# Patient Record
Sex: Male | Born: 1960 | Race: White | Hispanic: No | Marital: Married | Smoking: Former smoker
Health system: Southern US, Community
[De-identification: ages and names within clinical notes are randomized; demographics above are authoritative.]

## PROBLEM LIST (undated history)

## (undated) DIAGNOSIS — Z720 Tobacco use: Secondary | ICD-10-CM

## (undated) DIAGNOSIS — I471 Supraventricular tachycardia, unspecified: Secondary | ICD-10-CM

## (undated) DIAGNOSIS — E785 Hyperlipidemia, unspecified: Secondary | ICD-10-CM

## (undated) DIAGNOSIS — I2510 Atherosclerotic heart disease of native coronary artery without angina pectoris: Secondary | ICD-10-CM

## (undated) DIAGNOSIS — I4891 Unspecified atrial fibrillation: Secondary | ICD-10-CM

## (undated) DIAGNOSIS — I213 ST elevation (STEMI) myocardial infarction of unspecified site: Secondary | ICD-10-CM

## (undated) DIAGNOSIS — Z955 Presence of coronary angioplasty implant and graft: Secondary | ICD-10-CM

## (undated) DIAGNOSIS — I10 Essential (primary) hypertension: Secondary | ICD-10-CM

## (undated) HISTORY — DX: Presence of coronary angioplasty implant and graft: Z95.5

## (undated) HISTORY — DX: Essential (primary) hypertension: I10

## (undated) HISTORY — DX: ST elevation (STEMI) myocardial infarction of unspecified site: I21.3

## (undated) HISTORY — DX: Tobacco use: Z72.0

## (undated) HISTORY — DX: Supraventricular tachycardia: I47.1

## (undated) HISTORY — DX: Supraventricular tachycardia, unspecified: I47.10

## (undated) HISTORY — DX: Hyperlipidemia, unspecified: E78.5

## (undated) HISTORY — DX: Atherosclerotic heart disease of native coronary artery without angina pectoris: I25.10

## (undated) HISTORY — DX: Unspecified atrial fibrillation: I48.91

---

## 2004-10-04 ENCOUNTER — Observation Stay (HOSPITAL_COMMUNITY): Admission: EM | Admit: 2004-10-04 | Discharge: 2004-10-04 | Payer: Self-pay | Admitting: Emergency Medicine

## 2004-11-12 ENCOUNTER — Ambulatory Visit: Payer: Self-pay | Admitting: Internal Medicine

## 2005-05-31 ENCOUNTER — Ambulatory Visit: Payer: Self-pay | Admitting: Internal Medicine

## 2006-06-09 ENCOUNTER — Ambulatory Visit: Payer: Self-pay | Admitting: Internal Medicine

## 2006-12-16 HISTORY — PX: OTHER SURGICAL HISTORY: SHX169

## 2007-01-08 ENCOUNTER — Ambulatory Visit: Payer: Self-pay | Admitting: Internal Medicine

## 2007-01-08 LAB — CONVERTED CEMR LAB
BUN: 18 mg/dL (ref 6–23)
CO2: 27 meq/L (ref 19–32)
Calcium: 9.3 mg/dL (ref 8.4–10.5)
Chloride: 104 meq/L (ref 96–112)
Creatinine, Ser: 0.8 mg/dL (ref 0.4–1.5)
GFR calc Af Amer: 134 mL/min
GFR calc non Af Amer: 111 mL/min
Glucose, Bld: 111 mg/dL — ABNORMAL HIGH (ref 70–99)
HCT: 49.7 % (ref 39.0–52.0)
Hemoglobin: 17.3 g/dL — ABNORMAL HIGH (ref 13.0–17.0)
INR: 0.9 (ref 0.9–2.0)
MCHC: 34.8 g/dL (ref 30.0–36.0)
MCV: 89 fL (ref 78.0–100.0)
Platelets: 266 10*3/uL (ref 150–400)
Potassium: 3.6 meq/L (ref 3.5–5.1)
Prothrombin Time: 11.4 s (ref 10.0–14.0)
RBC: 5.59 M/uL (ref 4.22–5.81)
RDW: 12.5 % (ref 11.5–14.6)
Sodium: 139 meq/L (ref 135–145)
WBC: 11.1 10*3/uL — ABNORMAL HIGH (ref 4.5–10.5)
aPTT: 34.5 s (ref 26.5–36.5)

## 2007-01-13 ENCOUNTER — Ambulatory Visit (HOSPITAL_COMMUNITY): Admission: RE | Admit: 2007-01-13 | Discharge: 2007-01-14 | Payer: Self-pay | Admitting: Internal Medicine

## 2007-01-13 ENCOUNTER — Ambulatory Visit: Payer: Self-pay | Admitting: Internal Medicine

## 2007-02-11 ENCOUNTER — Ambulatory Visit: Payer: Self-pay | Admitting: Internal Medicine

## 2011-01-09 ENCOUNTER — Encounter
Admission: RE | Admit: 2011-01-09 | Discharge: 2011-01-09 | Payer: Self-pay | Source: Home / Self Care | Attending: Internal Medicine | Admitting: Internal Medicine

## 2011-04-23 ENCOUNTER — Inpatient Hospital Stay (HOSPITAL_COMMUNITY)
Admission: EM | Admit: 2011-04-23 | Discharge: 2011-04-25 | DRG: 853 | Disposition: A | Discharge status: Home / Self Care | Payer: Federal, State, Local not specified - PPO | Source: Ambulatory Visit | Attending: Cardiovascular Disease | Admitting: Cardiovascular Disease

## 2011-04-23 ENCOUNTER — Emergency Department (HOSPITAL_COMMUNITY): Payer: Federal, State, Local not specified - PPO

## 2011-04-23 DIAGNOSIS — I2510 Atherosclerotic heart disease of native coronary artery without angina pectoris: Secondary | ICD-10-CM

## 2011-04-23 DIAGNOSIS — F17200 Nicotine dependence, unspecified, uncomplicated: Secondary | ICD-10-CM | POA: Diagnosis present

## 2011-04-23 DIAGNOSIS — I959 Hypotension, unspecified: Secondary | ICD-10-CM | POA: Diagnosis present

## 2011-04-23 DIAGNOSIS — I10 Essential (primary) hypertension: Secondary | ICD-10-CM | POA: Diagnosis present

## 2011-04-23 DIAGNOSIS — Z955 Presence of coronary angioplasty implant and graft: Secondary | ICD-10-CM | POA: Insufficient documentation

## 2011-04-23 DIAGNOSIS — I2119 ST elevation (STEMI) myocardial infarction involving other coronary artery of inferior wall: Principal | ICD-10-CM | POA: Diagnosis present

## 2011-04-23 DIAGNOSIS — E785 Hyperlipidemia, unspecified: Secondary | ICD-10-CM | POA: Diagnosis present

## 2011-04-23 DIAGNOSIS — R079 Chest pain, unspecified: Secondary | ICD-10-CM

## 2011-04-23 DIAGNOSIS — I213 ST elevation (STEMI) myocardial infarction of unspecified site: Secondary | ICD-10-CM

## 2011-04-23 HISTORY — PX: CORONARY STENT PLACEMENT: SHX1402

## 2011-04-23 HISTORY — DX: ST elevation (STEMI) myocardial infarction of unspecified site: I21.3

## 2011-04-23 HISTORY — DX: Presence of coronary angioplasty implant and graft: Z95.5

## 2011-04-23 LAB — HEMOGLOBIN A1C
Hgb A1c MFr Bld: 6 % — ABNORMAL HIGH (ref <5.7)
Mean Plasma Glucose: 126 mg/dL — ABNORMAL HIGH (ref <117)

## 2011-04-23 LAB — PROTIME-INR
INR: 0.94 (ref 0.00–1.49)
Prothrombin Time: 12.8 seconds (ref 11.6–15.2)

## 2011-04-23 LAB — CARDIAC PANEL(CRET KIN+CKTOT+MB+TROPI)
CK, MB: 203.1 ng/mL (ref 0.3–4.0)
Relative Index: 8.9 — ABNORMAL HIGH (ref 0.0–2.5)
Total CK: 2274 U/L — ABNORMAL HIGH (ref 7–232)
Troponin I: >25 ng/mL (ref <0.30)

## 2011-04-23 LAB — APTT: aPTT: 29 seconds (ref 24–37)

## 2011-04-23 LAB — COMPREHENSIVE METABOLIC PANEL
ALT: 52 U/L (ref 0–53)
AST: 163 U/L — ABNORMAL HIGH (ref 0–37)
Albumin: 3.4 g/dL — ABNORMAL LOW (ref 3.5–5.2)
Alkaline Phosphatase: 82 U/L (ref 39–117)
BUN: 13 mg/dL (ref 6–23)
CO2: 26 mEq/L (ref 19–32)
Calcium: 8.9 mg/dL (ref 8.4–10.5)
Chloride: 105 mEq/L (ref 96–112)
Creatinine, Ser: 0.52 mg/dL (ref 0.4–1.5)
GFR calc Af Amer: >60 mL/min (ref >60)
GFR calc non Af Amer: >60 mL/min (ref >60)
Glucose, Bld: 104 mg/dL — ABNORMAL HIGH (ref 70–99)
Potassium: 4 mEq/L (ref 3.5–5.1)
Sodium: 138 mEq/L (ref 135–145)
Total Bilirubin: 0.4 mg/dL (ref 0.3–1.2)
Total Protein: 6.2 g/dL (ref 6.0–8.3)

## 2011-04-23 LAB — TSH: TSH: 1.518 u[IU]/mL (ref 0.350–4.500)

## 2011-04-23 LAB — POCT ACTIVATED CLOTTING TIME: Activated Clotting Time: 458 seconds

## 2011-04-23 LAB — MRSA PCR SCREENING: MRSA by PCR: NEGATIVE

## 2011-04-24 DIAGNOSIS — I214 Non-ST elevation (NSTEMI) myocardial infarction: Secondary | ICD-10-CM

## 2011-04-24 LAB — POCT I-STAT, CHEM 8
BUN: 15 mg/dL (ref 6–23)
Calcium, Ion: 1.1 mmol/L — ABNORMAL LOW (ref 1.12–1.32)
Chloride: 86 mEq/L — ABNORMAL LOW (ref 96–112)
Creatinine, Ser: 0.4 mg/dL (ref 0.4–1.5)
Glucose, Bld: 124 mg/dL — ABNORMAL HIGH (ref 70–99)
HCT: 44 % (ref 39.0–52.0)
Hemoglobin: 15 g/dL (ref 13.0–17.0)
Potassium: 3.6 mEq/L (ref 3.5–5.1)
Sodium: 120 mEq/L — ABNORMAL LOW (ref 135–145)
TCO2: 18 mmol/L (ref 0–100)

## 2011-04-24 LAB — CBC
HCT: 45.3 % (ref 39.0–52.0)
Hemoglobin: 15.3 g/dL (ref 13.0–17.0)
MCH: 30.3 pg (ref 26.0–34.0)
MCHC: 33.8 g/dL (ref 30.0–36.0)
MCV: 89.7 fL (ref 78.0–100.0)
Platelets: 246 10*3/uL (ref 150–400)
RBC: 5.05 MIL/uL (ref 4.22–5.81)
RDW: 13.9 % (ref 11.5–15.5)
WBC: 10.9 10*3/uL — ABNORMAL HIGH (ref 4.0–10.5)

## 2011-04-24 LAB — LIPID PANEL
Cholesterol: 140 mg/dL (ref 0–200)
HDL: 30 mg/dL — ABNORMAL LOW (ref >39)
LDL Cholesterol: 86 mg/dL (ref 0–99)
Total CHOL/HDL Ratio: 4.7 RATIO
Triglycerides: 119 mg/dL (ref <150)
VLDL: 24 mg/dL (ref 0–40)

## 2011-04-24 LAB — BASIC METABOLIC PANEL
BUN: 10 mg/dL (ref 6–23)
CO2: 27 mEq/L (ref 19–32)
Calcium: 9.2 mg/dL (ref 8.4–10.5)
Chloride: 103 mEq/L (ref 96–112)
Creatinine, Ser: 0.61 mg/dL (ref 0.4–1.5)
GFR calc Af Amer: >60 mL/min (ref >60)
GFR calc non Af Amer: >60 mL/min (ref >60)
Glucose, Bld: 110 mg/dL — ABNORMAL HIGH (ref 70–99)
Potassium: 4 mEq/L (ref 3.5–5.1)
Sodium: 138 mEq/L (ref 135–145)

## 2011-04-24 LAB — CARDIAC PANEL(CRET KIN+CKTOT+MB+TROPI)
CK, MB: 107.4 ng/mL (ref 0.3–4.0)
CK, MB: 43.4 ng/mL (ref 0.3–4.0)
Relative Index: 7.7 — ABNORMAL HIGH (ref 0.0–2.5)
Relative Index: 6.1 — ABNORMAL HIGH (ref 0.0–2.5)
Total CK: 1389 U/L — ABNORMAL HIGH (ref 7–232)
Total CK: 706 U/L — ABNORMAL HIGH (ref 7–232)
Troponin I: >25 ng/mL (ref <0.30)
Troponin I: 20.32 ng/mL (ref <0.30)

## 2011-04-24 LAB — MAGNESIUM: Magnesium: 2.1 mg/dL (ref 1.5–2.5)

## 2011-04-25 ENCOUNTER — Telehealth: Payer: Self-pay | Admitting: Cardiology

## 2011-04-25 DIAGNOSIS — I2119 ST elevation (STEMI) myocardial infarction involving other coronary artery of inferior wall: Secondary | ICD-10-CM

## 2011-04-25 NOTE — Telephone Encounter (Signed)
Patient's wife is calling and wants to know when and if she can come in and pick up some samples of Effient.  She said that her husband is being d/c from Hos.today. Patient has never been seen here. [place] saw him in the hos--cath.

## 2011-04-25 NOTE — Telephone Encounter (Signed)
Wife called. He is being d/c from hospital and wanted to know if we had samples. Advised that we did and may come pick up.

## 2011-04-29 NOTE — H&P (Addendum)
Mark [name], Mark [name]               ACCOUNT NO.:  [id number]  MEDICAL RECORD NO.:  [id number]           PATIENT TYPE:  E  LOCATION:  [facility]                         FACILITY:  [facility]  PHYSICIAN:  Mark [name], M.D.  DATE OF BIRTH:  1961-09-09  DATE OF ADMISSION:  04/23/2011 DATE OF DISCHARGE:                             HISTORY & PHYSICAL   PRIMARY CARDIOLOGIST:  Mark Canning. Ladona Ridgel, MD  CHIEF COMPLAINT:  Chest pain.  HISTORY OF PRESENT ILLNESS:  Mark [name] is a 50 year old gentleman with a history of SVT ablation in 2008, hyperlipidemia, hypertension, and tobacco abuse who developed chest pain at approximately 5:30 this morning, and his wife drove him to [facility] where code STEMI was activated and identified.  He had subtle ST elevation in V2, V3, aVF with reciprocal ST depression in aVL, V2.  He received aspirin, morphine, heparin, and Zofran in the ER and was taken urgently to the cath lab.  His pain is currently reduced, although he is anxious about the situation.  PAST MEDICAL HISTORY: 1. SVT, status post ablation in 2008. 2. Hyperlipidemia. 3. Hypertension.  MEDICATIONS: 1. Bystolic 5 mg daily. 2. Lipitor 40 mg daily.  ALLERGIES:  PENICILLIN.  SOCIAL HISTORY:  Mark [name] is married, has two children.  His daughter works a Buyer, retail.  He has ongoing history of tobacco abuse, having smoked for 25 years.  He denies any alcohol use.  FAMILY HISTORY:  Positive for coronary artery disease in several of his uncles.  REVIEW OF SYSTEMS:  Positive for chest pain.  Negative for shortness of breath.  All other systems reviewed and are negative except for those noted in HPI.  LABORATORY DATA:  Pending.  RADIOLOGY:  None yet.  EKG:  ST elevation in V2, V3, aVF with ST depression in aVL and V2.  PHYSICAL EXAMINATION:  VITAL SIGNS:  Temperature 97.5, pulse 79, respirations 18, blood pressure 138/87, pulse ox 90% on 2 L. GENERAL:  This is an anxious but  well-appearing white male in no acute distress. HEENT:  Normocephalic, atraumatic with extraocular movements intact and clear sclera.  Nares without discharge. NECK:  Supple. HEART:  Auscultation of the heart reveals regular rate and rhythm with S1 and S2 without murmurs, rubs, or gallops. LUNGS:  Clear to auscultation bilaterally without wheezes, rales, or rhonchi. ABDOMEN:  Soft, nontender, nondistended.  Positive bowel sounds. EXTREMITIES:  Warm and dry without edema. NEUROLOGIC:  He is alert and oriented x3, and responds to questions appropriately.  He is tearful, but normal affect given the situation.  ASSESSMENT/PLAN:  The patient was seen and examined initially by Dr. Riley Kill and myself, then fairly examined and assessed by Dr. Peter [name] who was performing the emergent cardiac catheterization on this gentleman.  He is a 50 year old man with a history of SVT ablation, hypertension, hyperlipidemia, and ongoing tobacco abuse who presents to [facility] with chest pain for approximately the last hour, identified as an acute inferior STEMI.  He will be undergoing emergent cardiac catheterization, plus or minus PCI or other interventions based on the findings by Dr. [place].  Consent was obtained  and Dr. Riley Kill has also updated family in this situation.     Mark [name], P.A.C.   ______________________________ Mark [name], M.D.    DD/MEDQ  D:  04/23/2011  T:  04/23/2011  Job:  [id number]  cc:   Mark Canning. Ladona Ridgel, MD  Electronically Signed by Mark [name] M.D. on 04/29/2011 09:40:44 AM Electronically Signed by Mark [name]  on 05/06/2011 01:50:40 PM

## 2011-04-29 NOTE — Cardiovascular Report (Signed)
Mark [name], VOORHIES               ACCOUNT NO.:  [id number]  MEDICAL RECORD NO.:  [id number]           PATIENT TYPE:  I  LOCATION:  2807                         FACILITY:  [facility]  PHYSICIAN:  Peter M. [place], M.D.  DATE OF BIRTH:  1961-01-20  DATE OF PROCEDURE:  04/23/2011 DATE OF DISCHARGE:                           CARDIAC CATHETERIZATION   INDICATIONS FOR PROCEDURE:  The patient is a 50 year old white male with history of tobacco abuse, hypertension, and hyperlipidemia who presents this morning with an acute inferior lateral myocardial infarction.  The patient's onset of pain was 5:30 a.m. and ECG showed ST-elevation of 3 mm in leads II, III and aVF as well as V6.  There was ST depression in leads aVL and V1 and V2.  This is consistent with an acute inferior lateral ST elevation myocardial infarction.  PROCEDURE:  Left heart catheterization, coronary and left ventricular angiography, intracoronary stenting of the third obtuse marginal vessel.  ACCESS:  Via the right radial artery using standard Seldinger technique.  EQUIPMENT:  6-French 3.5-cm left Judkins, 6-French 4 cm right Judkins catheter, 6-French pigtail catheter, 6-French FL 3.5 guide, a Prowater wire, a 2.75 x 20 mm VeriFLEX stent, a 3.0 x 15-mm Hanna Trek balloon.  MEDICATIONS:  Versed 1 mg IV, fentanyl 25 mcg IV, Verapamil 3 mg intraarterial.  Angiomax bolus of 0.75 mg/kg followed by continuous infusion of 1.75 mg/kg per hour.  Subsequent ACT was 451.  Effient 60 mg p.o.  CONTRAST:  110 mL of Omnipaque.  HEMODYNAMIC DATA:  Aortic pressure was 95/61 with a mean of 78 mmHg, left ventricle pressure is 91 with EDP of 15 mmHg.  ANGIOGRAPHIC DATA:  The left coronary arises and distributes normally. The left main coronary artery is normal.  Left anterior descending artery is calcified in the proximal vessel. There is diffuse 70% disease in the proximal LAD extending into the first diagonal.  There is some ectasia at  the bifurcation of the LAD and diagonal.  Left circumflex coronary artery is a large vessel which gives rise to 3 marginal vessels before terminating in the AV groove.  There is 40% disease in the proximal circumflex.  The first obtuse marginal vessel has 40% disease proximally.  The third obtuse marginal vessel was occluded proximally.  The right coronary artery arises and distributes normally and has diffuse 30-40% disease in the midvessel.  At this point, we proceeded with intervention of the third obtuse marginal vessel which was the culprit lesion.  After anticoagulation, this lesion was crossed with moderate difficulty with a wire.  We predilated the lesion with reperfusion using a 2.5 x 15-mm Apex balloon up to 4 atmospheres.  We then stented the proximal obtuse marginal vessel using a 2.75 x 20 mm VeriFLEX which was postdilated with a 3.0 x 15-mm [place] Trek balloon up to 14 atmospheres.  This yielded excellent angiographic result with 0% residual stenosis and TIMI grade 3 flow.  At this point, we performed left ventricular angiography in the RAO view.  This demonstrates normal left ventricular size with mid minimal inferior hypokinesia and normal ejection fraction of 60%.  FINAL INTERPRETATION: 1.  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 2. Successful stenting of the third obtuse marginal vessel using a     bare-metal stent. 3. Normal overall left ventricular function.  PLAN:  We will continue therapy with aspirin and Effient.  We will focus on risk factor modification with lipid lowering therapy, tobacco cessation and blood pressure control.  We will need to reassess his LAD with functional study once he is recovered from his infarct to see if he needs any additional revascularization.          ______________________________ Peter M. [place], M.D.     PMJ/MEDQ  D:  04/23/2011  T:  04/23/2011  Job:  [id number]  cc:   Doylene Canning. Ladona Ridgel,  MD  Electronically Signed by PETER [place]and M.D. on 04/29/2011 09:40:52 AM

## 2011-05-02 ENCOUNTER — Telehealth: Payer: Self-pay | Admitting: *Deleted

## 2011-05-02 NOTE — Telephone Encounter (Signed)
Spoke with pts wife who is concerned because there was confusion as to who the pt should follow up post-hosp with.  The pt at one time (2008) saw Dr Sharrell Ku for an ablation of SVT.  He has had no problems with this since.  Pt's most recent hospitalization was for an MI and stenting perform by Dr Peter Swaziland.  The pt should follow up with Dr [place] and is aware of this.  He has a post hospital appt scheduled and is aware of the date and time.  He will call back if further questions or concerns.

## 2011-05-09 ENCOUNTER — Encounter: Payer: Self-pay | Admitting: Nurse Practitioner

## 2011-05-10 ENCOUNTER — Ambulatory Visit (INDEPENDENT_AMBULATORY_CARE_PROVIDER_SITE_OTHER): Payer: Federal, State, Local not specified - PPO | Admitting: Nurse Practitioner

## 2011-05-10 ENCOUNTER — Encounter: Payer: Self-pay | Admitting: Cardiology

## 2011-05-10 ENCOUNTER — Encounter: Payer: Self-pay | Admitting: Nurse Practitioner

## 2011-05-10 VITALS — BP 120/88 | HR 76 | Ht 67.0 in | Wt 152.0 lb

## 2011-05-10 DIAGNOSIS — E785 Hyperlipidemia, unspecified: Secondary | ICD-10-CM | POA: Insufficient documentation

## 2011-05-10 DIAGNOSIS — F17200 Nicotine dependence, unspecified, uncomplicated: Secondary | ICD-10-CM

## 2011-05-10 DIAGNOSIS — Z720 Tobacco use: Secondary | ICD-10-CM

## 2011-05-10 DIAGNOSIS — I2510 Atherosclerotic heart disease of native coronary artery without angina pectoris: Secondary | ICD-10-CM

## 2011-05-10 MED ORDER — ATORVASTATIN CALCIUM 80 MG PO TABS: 40.0000 mg | ORAL_TABLET | Freq: Every day | ORAL | Status: DC

## 2011-05-10 NOTE — Patient Instructions (Signed)
Goal of walking is 45 minutes each day. Stay on Lipitor 40 mg a day. Go to [email or web address] for the 4$ program. Stay on your other medicines. We will plan for a stress test in about 3 months You may return to work on June 2nd, half days only with no heavy lifting (nothing over 25#) for 3 weeks. We will see you back in about 3 weeks.  Congratulations on not smoking!!!

## 2011-05-10 NOTE — Assessment & Plan Note (Addendum)
He is doing well. I have spoken with Dr. [place]. Ideally, we would like to keep him on his Effient for the next one year. He is encouraged to not resume smoking. We will let him return to work on June 2nd for half days, light duty with no heavy lifting of over 25# for 3 weeks. I will see him back in 3 weeks. He is encouraged to continue with his walking, goal of 45 minutes per day. We will consider a stress test in about 3 months. Patient is agreeable to this plan and will call if any problems develop in the interim.

## 2011-05-10 NOTE — Assessment & Plan Note (Signed)
He seems committed to not resume smoking.

## 2011-05-10 NOTE — Progress Notes (Signed)
    Mark [name] Date of Birth: 10-04-1961   History of Present Illness: Mark [name] is seen back today for a post hospital visit. He is seen for Dr. [place]. He had a STEMI earlier in the month. He was treated with a BMS to the 3rd OM. He does have residual LAD disease. He will need follow up stress testing in the future. Since discharge, he has done very well. He has had no more chest pain. He is not smoking. He is using some of the patches. He is walking daily. He is currently on LOA and would like to go back to work soon. He and his wife have significant financial concerns. His lipids at time of admission showed an LDL of 86. His HDL is low at 30. He is on Lipitor 40mg  daily. He had no issues with his cath site (right radial).   Current Outpatient Prescriptions on File Prior to Visit  Medication Sig Dispense Refill  . aspirin 81 MG tablet Take 81 mg by mouth daily.        . metoprolol tartrate (LOPRESSOR) 25 MG tablet Take 25 mg by mouth 2 (two) times daily.        . prasugrel (EFFIENT) 10 MG TABS Take by mouth.        . DISCONTD: atorvastatin (LIPITOR) 40 MG tablet Take 40 mg by mouth daily.        Marland Kitchen DISCONTD: nebivolol (BYSTOLIC) 5 MG tablet Take 5 mg by mouth daily.          Allergies  Allergen Reactions  . Penicillins     Past Medical History  Diagnosis Date  . Chest pain   . Hyperlipidemia   . Hypertension   . Tobacco abuse   . SVT (supraventricular tachycardia)   . STEMI (ST elevation myocardial infarction) 04/23/11  . S/P coronary artery stent placement 04/23/11    BMS to the 3rd OM; has residual disease    Past Surgical History  Procedure Date  . Svt ablation  2008    Dr. Ladona Ridgel  . Coronary stent placement 04/23/11    BMS to the 3rd OM    History  Smoking status  . Former Smoker  . Quit date: 04/23/2011  Smokeless tobacco  . Former Neurosurgeon  . Quit date: 04/23/2011    History  Alcohol Use No    Family History  Problem Relation Age of Onset  . Hypertension Mother       Review of Systems: The review of systems is as above.  All other systems were reviewed and are negative.  Physical Exam: BP 120/88  Pulse 76  Ht 5\' 7"  (1.702 m)  Wt 152 lb (68.947 kg)  BMI 23.81 kg/m2 Patient is very pleasant and in no acute distress. Skin is warm and dry. Color is normal.  HEENT is unremarkable. Normocephalic/atraumatic. PERRL. Sclera are nonicteric. Neck is supple. No masses. No JVD. Lungs are clear. Cardiac exam shows a regular rate and rhythm. Abdomen is soft. Extremities are without edema. Gait and ROM are intact. No gross neurologic deficits noted.  LABORATORY DATA: N/A   Assessment / Plan:

## 2011-05-10 NOTE — Assessment & Plan Note (Addendum)
I have left him on his Lipitor 40 mg daily. He will try to obtain under the $4 program online. May need to consider adding Niaspan in the future.

## 2011-05-31 ENCOUNTER — Encounter: Payer: Self-pay | Admitting: Nurse Practitioner

## 2011-05-31 ENCOUNTER — Ambulatory Visit (INDEPENDENT_AMBULATORY_CARE_PROVIDER_SITE_OTHER): Payer: Federal, State, Local not specified - PPO | Admitting: Nurse Practitioner

## 2011-05-31 VITALS — BP 140/90 | HR 76 | Ht 67.0 in | Wt 152.2 lb

## 2011-05-31 DIAGNOSIS — I2510 Atherosclerotic heart disease of native coronary artery without angina pectoris: Secondary | ICD-10-CM

## 2011-05-31 DIAGNOSIS — I219 Acute myocardial infarction, unspecified: Secondary | ICD-10-CM

## 2011-05-31 DIAGNOSIS — F17200 Nicotine dependence, unspecified, uncomplicated: Secondary | ICD-10-CM

## 2011-05-31 DIAGNOSIS — Z720 Tobacco use: Secondary | ICD-10-CM

## 2011-05-31 DIAGNOSIS — I213 ST elevation (STEMI) myocardial infarction of unspecified site: Secondary | ICD-10-CM

## 2011-05-31 NOTE — Progress Notes (Signed)
    [name] Date of Birth: Apr 19, 1961   History of Present Illness: Zaahir is seen today for a follow up visit. He is seen for Dr. [place]. He has had a STEMI recently. He does have known residual disease. He is doing well. He is back to work. He is a little fatigued, probably from his beta blocker. No chest pain. He is walking daily and feels good. He is not smoking. He is using some nicotine patches.   Current Outpatient Prescriptions on File Prior to Visit  Medication Sig Dispense Refill  . aspirin 81 MG tablet Take 81 mg by mouth daily.        Marland Kitchen atorvastatin (LIPITOR) 80 MG tablet Take 0.5 tablets (40 mg total) by mouth daily.  30 tablet  11  . metoprolol tartrate (LOPRESSOR) 25 MG tablet Take 25 mg by mouth 2 (two) times daily.        . prasugrel (EFFIENT) 10 MG TABS Take by mouth.          Allergies  Allergen Reactions  . Penicillins     Past Medical History  Diagnosis Date  . Chest pain   . Hyperlipidemia   . Hypertension   . Tobacco abuse   . SVT (supraventricular tachycardia)   . STEMI (ST elevation myocardial infarction) 04/23/11  . S/P coronary artery stent placement 04/23/11    BMS to the 3rd OM; has residual disease    Past Surgical History  Procedure Date  . Svt ablation  2008    Dr. Ladona Ridgel  . Coronary stent placement 04/23/11    BMS to the 3rd OM    History  Smoking status  . Former Smoker  . Quit date: 04/23/2011  Smokeless tobacco  . Former Neurosurgeon  . Quit date: 04/23/2011    History  Alcohol Use No    Family History  Problem Relation Age of Onset  . Hypertension Mother     Review of Systems: The review of systems is positive for some fatigue.  All other systems were reviewed and are negative.  Physical Exam: BP 140/90  Pulse 76  Ht 5\' 7"  (1.702 m)  Wt 152 lb 3.2 oz (69.037 kg)  BMI 23.84 kg/m2 BP 130/80 Patient is very pleasant and in no acute distress. Skin is warm and dry. Color is normal.  HEENT is unremarkable.  Normocephalic/atraumatic. PERRL. Sclera are nonicteric. Neck is supple. No masses. No JVD. Lungs are clear. Cardiac exam shows a regular rate and rhythm. Abdomen is soft. Extremities are without edema. Gait and ROM are intact. No gross neurologic deficits noted.  LABORATORY DATA: N/A   Assessment / Plan:

## 2011-05-31 NOTE — Assessment & Plan Note (Signed)
He is not smoking. He is using his patches. I encouraged smoking cessation.

## 2011-05-31 NOTE — Patient Instructions (Addendum)
Stay on your current medicines. We will go ahead and get your stress test done towards the middle to end of July Congratulations on not smoking. You will see Dr. [place]and back after your stress test to discuss.

## 2011-05-31 NOTE — Assessment & Plan Note (Signed)
He is doing well. We will arrange for a stress test towards the end of July. He will be going on vacation the first week of August. I think his fatigue is probably related to his beta blocker but he is willing to continue. Overall, he is doing well. I encouraged him to stay off of his cigarettes. We will see him back after his stress test to review those findings. He is to call for any problems in the interm. Patient is agreeable to this plan and will call if any problems develop in the interim.

## 2011-06-12 ENCOUNTER — Telehealth: Payer: Self-pay | Admitting: Cardiology

## 2011-06-12 NOTE — Telephone Encounter (Signed)
Patient wife called and wants to know if she can come by and pick up samples of Effient.

## 2011-06-12 NOTE — Telephone Encounter (Signed)
Called requesting samples of effient. Do have some available. Will pick up

## 2011-06-24 ENCOUNTER — Telehealth: Payer: Self-pay | Admitting: Cardiology

## 2011-06-24 NOTE — Telephone Encounter (Signed)
Patients daughter, Hughes Better will be coming by to pick up effient samples.

## 2011-06-24 NOTE — Telephone Encounter (Signed)
Called requesting samples of Effient. Have some available. Will pick up

## 2011-07-04 NOTE — Discharge Summary (Signed)
Mark [name], Mark [name]               ACCOUNT NO.:  [id number]  MEDICAL RECORD NO.:  [id number]           PATIENT TYPE:  I  LOCATION:  2039                         FACILITY:  [facility]  PHYSICIAN:  Mark [name], M.D.  DATE OF BIRTH:  12/31/1960  DATE OF ADMISSION:  04/23/2011 DATE OF DISCHARGE:  04/25/2011                              DISCHARGE SUMMARY   PRIMARY CARDIOLOGIST:  Mark Vallez M. [place], MD  ELECTROPHYSIOLOGIST:  Mark Canning. Ladona Ridgel, MD  DISCHARGE DIAGNOSES: 1. Acute inferolateral ST-elevation myocardial infarction.     a.     Cardiac catheterization on Apr 23, 2011:  Two-vessel      obstructive atherosclerotic coronary artery disease.  Successful      stenting of the third obtuse marginal using a 2.75- x 2.0-mm      VeriFLEX.  Normal overall left ventricular function (left anterior      descending with 70% proximal residual stenosis). 2. Ongoing tobacco abuse. 3. Hypertension (hypotensive this admission, systolic blood pressure     86-105 this admission). 4. Dyslipidemia.     a.     Total cholesterol 140, triglycerides 119, HDL 30, LDL 86,      total cholesterol/HDL ratio 4.7.  SECONDARY DIAGNOSIS:  History of supraventricular tachycardia. A.  Status post ablation, 2008 (followed by Dr. Ladona [name]).  ALLERGIES AND INTOLERANCES:  Penicillins (passed out).  PROCEDURES: 1. EKG on Apr 23, 2011:  NSR with ST elevation in V2, V3, aVF, ST     depression in aVL, and V2. 2. Cardiac catheterization on Apr 23, 2011:  LM normal.  LAD calcified     proximally, diffuse 70% disease in the proximal LAD extending into     the D1, and then ectasia at the bifurcation of LAD and diagonal.     Left circumflex coronary is a large vessel, gives off three obtuse     marginal branches, terminating in the AV groove.  A 40% disease in     the proximal circumflex, OM-1 with 40% disease proximally, OM-3     occluded proximally.  RCA arises and distributes normally and has     diffuse 30% to 40% disease in  the mid vessel. 3. PCI with 2.75- x 2.0-mm VeriFLEX to OM-3. 4. Normal overall LV function.  HISTORY OF PRESENT ILLNESS:  Mr. Mark [name] is a 50 year old Caucasian gentleman with the above-noted medical history but no prior cardiac history other than SVT ablated in 2008 until he presented to [facility] with complaints of chest discomfort, subsequently found to have inferolateral ST changes consistent with ST MI, taken emergently to the cardiac catheterization lab.  HOSPITAL COURSE:  The patient was admitted and underwent procedures as described above.  He tolerated them well without any significant complications.  Loaded with Effient and will continue on DAPT with low- dose aspirin and Effient going forward in the setting of PCI with BMS. Did receive tobacco cessation consult, given ongoing tobacco abuse.  He worked with Cardiac Rehab Phase I on Apr 24, 2011, ambulating 510 feet with no complaints and only one assist.  Seen on the morning of Apr 25, 2011, by attending  cardiologist/primary cardiologist, Dr. Angelys Yetman [name], and deemed stable for discharge with early followup at [facility], scheduled for May 10, 2011, at 9:15 a.m.  Please see discharge med section for final discharge meds plan.  At the time of his discharge, the patient received his new med list, prescriptions, followup instructions, and postcath instructions.  All questions and concerns were addressed prior to leaving the hospital.  DISCHARGE LABORATORY DATA:  WBC is 10.9, HGB 15.3, HCT 45.3, PLT count is 246.  Protime 12.8, INR 0.94.  Sodium 138, potassium 4.0, chloride 103, bicarb 27, BUN 10, creatinine 0.61, glucose 110.  Liver function tests within normal limits except for AST at 163, albumin 3.4, calcium 9.2, magnesium 2.1.  Hemoglobin A1c 6.0 with a mean plasma glucose of 126.  First full set of enzymes, CK 2274, MB 203 with a relative index of 8.9, troponin greater than 25.  Second full set, CK 1389, MB  107.4 with relative index of 7.7, and troponin greater than 25.  Third full set, CK 706, MB 43.4, relative index 6.1, troponin 20.32.  Total cholesterol 140, triglycerides 119, HDL 30, LDL 86, total cholesterol/HDL ratio 4.7.  TSH 1.518.  FOLLOWUP PLANS AND APPOINTMENTS:  Please see hospital course.  DISCHARGE MEDICATIONS: 1. Atorvastatin 80 mg 1 tablet p.o. at bedtime. 2. Enteric-coated aspirin 81 mg p.o. daily. 3. Metoprolol 25 mg p.o. b.i.d. 4. Sublingual nitroglycerin 0.4 mg 1 tablet q.5 minutes up to three     doses p.r.n. for chest discomfort.  Duration of discharge encounter including physician time was 35 minutes.     Mark [name], PAC   ______________________________ Mirka Barbone M. [name], M.D.    MS/MEDQ  D:  04/25/2011  T:  04/25/2011  Job:  [id number]  cc:   Mark Canning. Ladona Ridgel, MD  Electronically Signed by Latorsha Curling [name] M.D. on 07/04/2011 11:46:20 AM

## 2011-07-08 ENCOUNTER — Ambulatory Visit (HOSPITAL_COMMUNITY): Payer: Federal, State, Local not specified - PPO | Attending: Cardiology | Admitting: Radiology

## 2011-07-08 VITALS — Ht 67.0 in | Wt 151.0 lb

## 2011-07-08 DIAGNOSIS — I213 ST elevation (STEMI) myocardial infarction of unspecified site: Secondary | ICD-10-CM

## 2011-07-08 DIAGNOSIS — Z8249 Family history of ischemic heart disease and other diseases of the circulatory system: Secondary | ICD-10-CM | POA: Insufficient documentation

## 2011-07-08 DIAGNOSIS — Z87891 Personal history of nicotine dependence: Secondary | ICD-10-CM | POA: Insufficient documentation

## 2011-07-08 DIAGNOSIS — R5381 Other malaise: Secondary | ICD-10-CM | POA: Insufficient documentation

## 2011-07-08 DIAGNOSIS — I252 Old myocardial infarction: Secondary | ICD-10-CM | POA: Insufficient documentation

## 2011-07-08 DIAGNOSIS — R079 Chest pain, unspecified: Secondary | ICD-10-CM | POA: Insufficient documentation

## 2011-07-08 DIAGNOSIS — I10 Essential (primary) hypertension: Secondary | ICD-10-CM | POA: Insufficient documentation

## 2011-07-08 DIAGNOSIS — I2510 Atherosclerotic heart disease of native coronary artery without angina pectoris: Secondary | ICD-10-CM

## 2011-07-08 MED ORDER — TECHNETIUM TC 99M TETROFOSMIN IV KIT
11.0000 | PACK | Freq: Once | INTRAVENOUS | Status: AC | PRN
  Administered 2011-07-08: 11 via INTRAVENOUS

## 2011-07-08 MED ORDER — TECHNETIUM TC 99M TETROFOSMIN IV KIT
33.0000 | PACK | Freq: Once | INTRAVENOUS | Status: AC | PRN
  Administered 2011-07-08: 33 via INTRAVENOUS

## 2011-07-08 NOTE — Progress Notes (Signed)
[facility] SITE 3 NUCLEAR MED [address] [place] [address] [phone]  Cardiology Nuclear Med Study  Mark [name] is a 50 y.o. male [id number] November 08, 1961   Nuclear Med Background Indication for Stress Test:  Evaluation for Ischemia, Stent Patency and Assess known LAD lesion seen on cath 04/23/11 History:  '08 Ablation, 04/23/11 ILWI>Cath>Stent-OM3, EF=60%; H/O SVT Cardiac Risk Factors: Family History - CAD, History of Smoking, Hypertension and Lipids  Symptoms:  Chest Pain (last date of chest discomfort: None since stent put in) and Fatigue   Nuclear Pre-Procedure Caffeine/Decaff Intake:  None NPO After: 7:00pm   Lungs:  Clear. IV 0.9% NS with Angio Cath:  22g  IV Site: R Hand  IV Started by:  Irean Hong, RN  Chest Size (in):  38 Cup Size: n/a  Height: 5\' 7"  (1.702 m)  Weight:  151 lb (68.493 kg)  BMI:  Body mass index is 23.65 kg/(m^2). Tech Comments:  Held metoprolol x 24 hrs    Nuclear Med Study 1 or 2 day study: 1 day  Stress Test Type:  Stress  Reading MD: Dietrich Pates, MD  Order Authorizing Provider:  Peter Swaziland, MD  Resting Radionuclide: Technetium 71m Tetrofosmin  Resting Radionuclide Dose: 11 mCi   Stress Radionuclide:  Technetium 34m Tetrofosmin  Stress Radionuclide Dose: 33 mCi           Stress Protocol Rest HR: 64 Stress HR: 173  Rest BP: 129/80 Stress BP: 171/87  Exercise Time (min): 10:30 METS: 12.5   Predicted Max HR: 170 bpm % Max HR: 101.76 bpm Rate Pressure Product: 21308   Dose of Adenosine (mg):  n/a Dose of Lexiscan: n/a mg  Dose of Atropine (mg): n/a Dose of Dobutamine: n/a mcg/kg/min (at max HR)  Stress Test Technologist: Smiley Houseman, CMA-N  Nuclear Technologist:  Domenic Polite, CNMT     Rest Procedure:  Myocardial perfusion imaging was performed at rest 45 minutes following the intravenous administration of Technetium 49m Tetrofosmin.  Rest ECG: IRBBB  Stress Procedure:  The patient exercised for 10:30  on the treadmill utilizing the Bruce protocol.  The patient stopped due to fatigue and denied any chest pain.  There were no diagnostic ST-T wave changes.  Technetium 82m Tetrofosmin was injected at peak exercise and myocardial perfusion imaging was performed after a brief delay.  Stress ECG: No significant change from baseline ECG  QPS Raw Data Images: Soft tissue (diaphragm) underlies the inferior wall. Stress Images: Defect in the inferior wall (base, mid, distal), inferolateral wall (base, mid, distal).  Normal perfusion elsewhere. Rest Images:  Minimal improvement in the distal inferior, inferolateral wall.   Subtraction (SDS):  By qunatitation significant for ischemia.  Visual does not appear significant. Transient Ischemic Dilatation (Normal <1.22):  1.02 Lung/Heart Ratio (Normal <0.45):  .27  Quantitative Gated Spect Images QGS EDV:  83 ml QGS ESV:  27 ml QGS cine images: Normal wall motion, thickening. QGS EF: 67%  Impression Exercise Capacity: Excellent BP Response:  Normal blood pressure response. Clinical Symptoms:  No chest pain. ECG Impression:  No significant ST segment change suggestive of ischemia. Comparison with Prior Nuclear Study: No previous nuclear study performed  Overall Impression:  Inferior/inferolateral defect consistent with inferior/inferolateral subendocardial scar  With mild periinfarct ischemia.  Cannot exclude soft tissue attenuation.  LVEF is 67% with normal wall motion.Normal perfusion elsewhere.  Dietrich Pates

## 2011-07-09 NOTE — Progress Notes (Signed)
nuc med report routed to Dr. Jordan 07/09/11 Sadarius Norman  

## 2011-07-09 NOTE — Progress Notes (Signed)
Nuclear findings consistent with prior infarct. No ischemia in LAD territory. Prior cath showed 70% stenosis. EF is good. Need to continue medical therapy. Theron Arista Sw[place]and

## 2011-07-10 ENCOUNTER — Other Ambulatory Visit: Payer: Self-pay | Admitting: Emergency Medicine

## 2011-07-10 ENCOUNTER — Ambulatory Visit (HOSPITAL_COMMUNITY)
Admission: RE | Admit: 2011-07-10 | Discharge: 2011-07-10 | Disposition: A | Discharge status: Home / Self Care | Payer: Federal, State, Local not specified - PPO | Source: Ambulatory Visit | Attending: Emergency Medicine | Admitting: Emergency Medicine

## 2011-07-10 DIAGNOSIS — R1031 Right lower quadrant pain: Secondary | ICD-10-CM

## 2011-07-11 NOTE — Progress Notes (Signed)
lm

## 2011-07-12 ENCOUNTER — Telehealth: Payer: Self-pay | Admitting: Cardiology

## 2011-07-12 NOTE — Progress Notes (Signed)
Notified of stress test results. Scheduled for OV on 7/30

## 2011-07-12 NOTE — Telephone Encounter (Signed)
Pt's wife returning Mark [name]'s call.

## 2011-07-15 ENCOUNTER — Telehealth: Payer: Self-pay | Admitting: Cardiology

## 2011-07-15 NOTE — Telephone Encounter (Signed)
Came in, in person toady under the impression that her husbands appointmnet was today, and upon finding out that it is tomorrow, wanted to reschedule their appointment for tomorrow for a later date after the 12th of August. Please call back. Patient is EPIC only.

## 2011-07-16 ENCOUNTER — Ambulatory Visit: Payer: Federal, State, Local not specified - PPO | Admitting: Cardiology

## 2011-07-17 NOTE — Telephone Encounter (Signed)
Wife called back to reschedule Mark [name] app. They can in on Monday thinking app was then instead of Tuesday. Could not make app yest. Rescheduled for 8/31 at 2:30

## 2011-08-16 ENCOUNTER — Encounter: Payer: Self-pay | Admitting: Cardiology

## 2011-08-16 ENCOUNTER — Ambulatory Visit (INDEPENDENT_AMBULATORY_CARE_PROVIDER_SITE_OTHER): Payer: Federal, State, Local not specified - PPO | Admitting: Cardiology

## 2011-08-16 VITALS — BP 130/86 | HR 60 | Ht 67.0 in | Wt 155.4 lb

## 2011-08-16 DIAGNOSIS — F17200 Nicotine dependence, unspecified, uncomplicated: Secondary | ICD-10-CM

## 2011-08-16 DIAGNOSIS — Z720 Tobacco use: Secondary | ICD-10-CM

## 2011-08-16 DIAGNOSIS — I2510 Atherosclerotic heart disease of native coronary artery without angina pectoris: Secondary | ICD-10-CM

## 2011-08-16 DIAGNOSIS — E785 Hyperlipidemia, unspecified: Secondary | ICD-10-CM

## 2011-08-16 NOTE — Progress Notes (Signed)
    Mark [name] Date of Birth: 09-Aug-1961   History of Present Illness: Mark [name] is seen today for a follow up visit. He reports that he is feeling very well and is back to his normal activity. He is getting exercise regularly. He denies any chest pain, shortness of breath, or palpitations. He is no longer smoking.  Current Outpatient Prescriptions on File Prior to Visit  Medication Sig Dispense Refill  . aspirin 81 MG tablet Take 81 mg by mouth daily.        Marland Kitchen atorvastatin (LIPITOR) 80 MG tablet Take 0.5 tablets (40 mg total) by mouth daily.  30 tablet  11  . metoprolol tartrate (LOPRESSOR) 25 MG tablet Take 25 mg by mouth 2 (two) times daily.        . prasugrel (EFFIENT) 10 MG TABS Take by mouth.          Allergies  Allergen Reactions  . Penicillins     Past Medical History  Diagnosis Date  . CAD (coronary artery disease)   . Hyperlipidemia   . Hypertension   . Tobacco abuse   . SVT (supraventricular tachycardia)   . STEMI (ST elevation myocardial infarction) 04/23/11  . S/P coronary artery stent placement 04/23/11    BMS to the 3rd OM; has residual disease    Past Surgical History  Procedure Date  . Svt ablation  2008    Dr. Ladona Ridgel  . Coronary stent placement 04/23/11    BMS to the 3rd OM    History  Smoking status  . Former Smoker  . Quit date: 04/23/2011  Smokeless tobacco  . Former Neurosurgeon  . Quit date: 04/23/2011    History  Alcohol Use No    Family History  Problem Relation Age of Onset  . Hypertension Mother     Review of Systems: The review of systems is positive for some fatigue.  All other systems were reviewed and are negative.  Physical Exam: BP 130/86  Pulse 60  Ht 5\' 7"  (1.702 m)  Wt 155 lb 6.4 oz (70.489 kg)  BMI 24.34 kg/m2 BP 130/80 Patient is very pleasant and in no acute distress. Skin is warm and dry. Color is normal.  HEENT is unremarkable. Normocephalic/atraumatic. PERRL. Sclera are nonicteric. Neck is supple. No masses. No JVD.  Lungs are clear. Cardiac exam shows a regular rate and rhythm. Abdomen is soft. Extremities are without edema. Gait and ROM are intact. No gross neurologic deficits noted.  LABORATORY DATA: N/A Recent stress Myoview was reviewed. He was able to walk 10-1/2 minutes on the Bruce protocol without symptoms of chest pain or ECG changes. His Myoview images demonstrated a fixed inferior and inferior lateral defect without ischemia. There is no anterior wall ischemia. Ejection fraction was normal 67%.  Assessment / Plan:

## 2011-08-16 NOTE — Assessment & Plan Note (Signed)
I have congratulated him on his smoking cessation.

## 2011-08-16 NOTE — Patient Instructions (Signed)
Continue your current medications.  We will schedule you for fasting lab work.  I will see you again in 6 months.

## 2011-08-16 NOTE — Assessment & Plan Note (Signed)
We will schedule a followup lipid panel and chemistries on his current therapy.

## 2011-08-16 NOTE — Assessment & Plan Note (Signed)
He is asymptomatic. There is no evidence of ischemia in the anterior wall distribution so no further therapy at this time of his moderate disease there. Continue risk factor modification. I'll followup again in 6 months.

## 2011-08-21 ENCOUNTER — Other Ambulatory Visit (INDEPENDENT_AMBULATORY_CARE_PROVIDER_SITE_OTHER): Payer: Federal, State, Local not specified - PPO | Admitting: *Deleted

## 2011-08-21 DIAGNOSIS — I2510 Atherosclerotic heart disease of native coronary artery without angina pectoris: Secondary | ICD-10-CM

## 2011-08-21 LAB — HEPATIC FUNCTION PANEL
ALT: 33 U/L (ref 0–53)
Total Bilirubin: 0.8 mg/dL (ref 0.3–1.2)

## 2011-08-21 LAB — BASIC METABOLIC PANEL
BUN: 15 mg/dL (ref 6–23)
Chloride: 110 mEq/L (ref 96–112)
Creatinine, Ser: 0.7 mg/dL (ref 0.4–1.5)
GFR: 128.55 mL/min (ref >60.00)

## 2011-08-21 LAB — LIPID PANEL
Cholesterol: 117 mg/dL (ref 0–200)
LDL Cholesterol: 68 mg/dL (ref 0–99)
Triglycerides: 69 mg/dL (ref 0.0–149.0)
VLDL: 13.8 mg/dL (ref 0.0–40.0)

## 2011-09-26 ENCOUNTER — Other Ambulatory Visit: Payer: Self-pay | Admitting: *Deleted

## 2011-09-26 ENCOUNTER — Other Ambulatory Visit: Payer: Self-pay

## 2011-09-26 MED ORDER — METOPROLOL TARTRATE 25 MG PO TABS: 25.0000 mg | ORAL_TABLET | Freq: Two times a day (BID) | ORAL | Status: DC

## 2011-09-27 ENCOUNTER — Telehealth: Payer: Self-pay | Admitting: Cardiology

## 2011-09-27 NOTE — Telephone Encounter (Signed)
Do you have any samples of Effient they can pick up?  Please call them back.  He has about 2 wks left.  Aware that Synetta Fail is off today.  Leave message if no answer.

## 2011-09-27 NOTE — Telephone Encounter (Signed)
I spoke with pt wife. She will pick up Effient Monday. Pt has enough for the weekend.  Mylo Red RN

## 2011-10-25 ENCOUNTER — Other Ambulatory Visit: Payer: Self-pay | Admitting: Cardiology

## 2011-10-25 ENCOUNTER — Other Ambulatory Visit: Payer: Self-pay | Admitting: *Deleted

## 2011-10-25 MED ORDER — METOPROLOL TARTRATE 25 MG PO TABS: 25.0000 mg | ORAL_TABLET | Freq: Two times a day (BID) | ORAL | Status: DC

## 2011-11-26 ENCOUNTER — Telehealth: Payer: Self-pay | Admitting: Cardiology

## 2011-11-26 NOTE — Telephone Encounter (Signed)
SPOKE WITH PATIENTS WIFE ABOUT SAMPLES WILL BE PUT UP FRONT. SHE WILL PICK THEM UP TOMORROW 12/12

## 2011-11-26 NOTE — Telephone Encounter (Signed)
Pt needs samples of effient 10mg  qd

## 2012-01-08 ENCOUNTER — Ambulatory Visit (INDEPENDENT_AMBULATORY_CARE_PROVIDER_SITE_OTHER): Payer: Federal, State, Local not specified - PPO | Admitting: Cardiology

## 2012-01-08 ENCOUNTER — Encounter: Payer: Self-pay | Admitting: Cardiology

## 2012-01-08 ENCOUNTER — Other Ambulatory Visit: Payer: Self-pay

## 2012-01-08 VITALS — BP 110/64 | HR 74 | Ht 67.0 in | Wt 158.8 lb

## 2012-01-08 DIAGNOSIS — I2510 Atherosclerotic heart disease of native coronary artery without angina pectoris: Secondary | ICD-10-CM

## 2012-01-08 DIAGNOSIS — E785 Hyperlipidemia, unspecified: Secondary | ICD-10-CM

## 2012-01-08 DIAGNOSIS — Z720 Tobacco use: Secondary | ICD-10-CM

## 2012-01-08 DIAGNOSIS — I252 Old myocardial infarction: Secondary | ICD-10-CM

## 2012-01-08 DIAGNOSIS — F17200 Nicotine dependence, unspecified, uncomplicated: Secondary | ICD-10-CM

## 2012-01-08 DIAGNOSIS — E7800 Pure hypercholesterolemia, unspecified: Secondary | ICD-10-CM

## 2012-01-08 NOTE — Assessment & Plan Note (Signed)
His last lipid panel in September showed an excellent response to Lipitor. We will continue on his current therapy.

## 2012-01-08 NOTE — Progress Notes (Signed)
    Mark [name] Date of Birth: 07-29-1961   History of Present Illness: Mark [name] is seen today for a follow up visit. He reports that he is feeling very well. He is getting exercise regularly. He denies any chest pain, shortness of breath, or palpitations. He is no longer smoking.  Current Outpatient Prescriptions on File Prior to Visit  Medication Sig Dispense Refill  . aspirin 81 MG tablet Take 81 mg by mouth daily.        Marland Kitchen atorvastatin (LIPITOR) 80 MG tablet Take 0.5 tablets (40 mg total) by mouth daily.  30 tablet  11  . metoprolol tartrate (LOPRESSOR) 25 MG tablet Take 1 tablet (25 mg total) by mouth 2 (two) times daily.  60 tablet  3  . prasugrel (EFFIENT) 10 MG TABS Take by mouth.          Allergies  Allergen Reactions  . Penicillins     Past Medical History  Diagnosis Date  . CAD (coronary artery disease)   . Hyperlipidemia   . Hypertension   . Tobacco abuse   . SVT (supraventricular tachycardia)   . STEMI (ST elevation myocardial infarction) 04/23/11  . S/P coronary artery stent placement 04/23/11    BMS to the 3rd OM; has residual disease    Past Surgical History  Procedure Date  . Svt ablation  2008    Dr. Ladona Ridgel  . Coronary stent placement 04/23/11    BMS to the 3rd OM    History  Smoking status  . Former Smoker  . Quit date: 04/23/2011  Smokeless tobacco  . Former Neurosurgeon  . Quit date: 04/23/2011    History  Alcohol Use No    Family History  Problem Relation Age of Onset  . Hypertension Mother     Review of Systems: The review of systems is positive for some fatigue.  All other systems were reviewed and are negative.  Physical Exam: BP 110/64  Pulse 74  Ht 5\' 7"  (1.702 m)  Wt 158 lb 12.8 oz (72.031 kg)  BMI 24.87 kg/m2 BP 130/80 Patient is very pleasant and in no acute distress. Skin is warm and dry. Color is normal.  HEENT is unremarkable. Normocephalic/atraumatic. PERRL. Sclera are nonicteric. Neck is supple. No masses. No JVD. Lungs are  clear. Cardiac exam shows a regular rate and rhythm. Abdomen is soft. Extremities are without edema. Gait and ROM are intact. No gross neurologic deficits noted.  LABORATORY DATA: N/A   Assessment / Plan:

## 2012-01-08 NOTE — Assessment & Plan Note (Signed)
He is status post inferior lateral myocardial infarction with bare-metal stenting of the third obtuse marginal vessel in May of 2012. He had borderline LAD disease. Followup nuclear stress test showed no evidence of ischemia in the anterior wall. His ejection fraction was normal. We will continue dual antiplatelet therapy for one year at which point he can stop his prasugrel. I will followup again in 6 months and will check fasting lab work at that time.

## 2012-01-08 NOTE — Assessment & Plan Note (Signed)
He has continued with his smoking cessation.

## 2012-01-08 NOTE — Patient Instructions (Signed)
You may stop taking Effient one year from your heart attack. May of 2013.  Continue your other meds.  I will see you again in 6 months with fasting labs

## 2012-05-06 ENCOUNTER — Other Ambulatory Visit: Payer: Self-pay | Admitting: Cardiology

## 2012-05-06 MED ORDER — METOPROLOL TARTRATE 25 MG PO TABS: 25.0000 mg | ORAL_TABLET | Freq: Two times a day (BID) | ORAL | Status: DC

## 2012-05-18 ENCOUNTER — Other Ambulatory Visit: Payer: Self-pay | Admitting: *Deleted

## 2012-05-18 MED ORDER — NITROGLYCERIN 0.4 MG SL SUBL: 0.4000 mg | SUBLINGUAL_TABLET | SUBLINGUAL | Status: DC | PRN

## 2012-05-18 NOTE — Telephone Encounter (Signed)
Fax Received. Refill Completed. Sadi Arave Chowoe (R.M.A)   

## 2012-07-02 ENCOUNTER — Other Ambulatory Visit: Payer: Self-pay | Admitting: Nurse Practitioner

## 2012-07-07 ENCOUNTER — Other Ambulatory Visit (INDEPENDENT_AMBULATORY_CARE_PROVIDER_SITE_OTHER): Payer: Federal, State, Local not specified - PPO

## 2012-07-07 DIAGNOSIS — E785 Hyperlipidemia, unspecified: Secondary | ICD-10-CM

## 2012-07-07 DIAGNOSIS — I2510 Atherosclerotic heart disease of native coronary artery without angina pectoris: Secondary | ICD-10-CM

## 2012-07-07 LAB — HEPATIC FUNCTION PANEL
ALT: 29 U/L (ref 0–53)
AST: 24 U/L (ref 0–37)
Albumin: 4.3 g/dL (ref 3.5–5.2)

## 2012-07-07 LAB — BASIC METABOLIC PANEL
BUN: 10 mg/dL (ref 6–23)
Chloride: 108 mEq/L (ref 96–112)
Glucose, Bld: 113 mg/dL — ABNORMAL HIGH (ref 70–99)
Potassium: 5 mEq/L (ref 3.5–5.1)

## 2012-07-07 LAB — LIPID PANEL: Cholesterol: 112 mg/dL (ref 0–200)

## 2012-07-15 ENCOUNTER — Telehealth: Payer: Self-pay | Admitting: Cardiology

## 2012-07-15 NOTE — Telephone Encounter (Signed)
Patient's wife called stated husband has been having chest pain off and on for 1 week.States last night was the first time he told her.States he has took NTG with relief.States they are going on vacation this Sunday and would like to be seen.Appointment scheduled with Dr.Jordan tomorrow 07/16/12.Advised to go to ER if needed.

## 2012-07-15 NOTE — Telephone Encounter (Signed)
Please return call to patient wife Marylene Land [phone], patient c/o of mild chest pain at times and wants to see a doctor today.

## 2012-07-16 ENCOUNTER — Encounter: Payer: Self-pay | Admitting: Cardiology

## 2012-07-16 ENCOUNTER — Ambulatory Visit (INDEPENDENT_AMBULATORY_CARE_PROVIDER_SITE_OTHER): Payer: Federal, State, Local not specified - PPO | Admitting: Cardiology

## 2012-07-16 VITALS — BP 142/86 | HR 97 | Ht 67.0 in | Wt 157.0 lb

## 2012-07-16 DIAGNOSIS — I2510 Atherosclerotic heart disease of native coronary artery without angina pectoris: Secondary | ICD-10-CM

## 2012-07-16 MED ORDER — ALPRAZOLAM 0.25 MG PO TABS: 0.2500 mg | ORAL_TABLET | Freq: Every evening | ORAL | Status: DC | PRN

## 2012-07-16 MED ORDER — ALPRAZOLAM 0.25 MG PO TABS: 0.2500 mg | ORAL_TABLET | Freq: Every evening | ORAL | Status: AC | PRN

## 2012-07-16 NOTE — Progress Notes (Signed)
    Mark [name] Date of Birth: [birth year]   History of Present Illness: Mark [name] is seen today for a work in visit. He reports that over the past week he has felt some discomfort in his left precordial area. This is described as a brief discomfort lasting only a few seconds. It is not at all like his previous myocardial infarction. He actually notices it more when he is at rest. When he is working or active he does not notice the discomfort. He was getting ready to go to the beach on vacation and wanted to make sure everything was okay. His family reports that he is under a lot of stress.  Current Outpatient Prescriptions on File Prior to Visit  Medication Sig Dispense Refill  . aspirin 81 MG tablet Take 81 mg by mouth daily.        Marland Kitchen LIPITOR 80 MG tablet TAKE 1/2 TABLET BY MOUTH EVERY DAY  45 tablet  3  . metoprolol tartrate (LOPRESSOR) 25 MG tablet Take 1 tablet (25 mg total) by mouth 2 (two) times daily.  60 tablet  8  . nitroGLYCERIN (NITROSTAT) 0.4 MG SL tablet Place 1 tablet (0.4 mg total) under the tongue every 5 (five) minutes as needed.  25 tablet  5    Allergies  Allergen Reactions  . Penicillins     Past Medical History  Diagnosis Date  . CAD (coronary artery disease)   . Hyperlipidemia   . Hypertension   . Tobacco abuse   . SVT (supraventricular tachycardia)   . STEMI (ST elevation myocardial infarction) 04/23/11  . S/P coronary artery stent placement 04/23/11    BMS to the 3rd OM; has residual disease    Past Surgical History  Procedure Date  . Svt ablation  2008    Dr. Ladona Ridgel  . Coronary stent placement 04/23/11    BMS to the 3rd OM    History  Smoking status  . Former Smoker  . Quit date: 04/23/2011  Smokeless tobacco  . Former Neurosurgeon  . Quit date: 04/23/2011    History  Alcohol Use No    Family History  Problem Relation Age of Onset  . Hypertension Mother     Review of Systems: The review of systems is positive for some fatigue and stress.  All  other systems were reviewed and are negative.  Physical Exam: BP 142/86  Pulse 97  Ht 5\' 7"  (1.702 m)  Wt 71.215 kg (157 lb)  BMI 24.59 kg/m2  SpO2 98% BP 130/80 Patient is very pleasant and in no acute distress. Skin is warm and dry. Color is normal.  HEENT is unremarkable. Normocephalic/atraumatic. PERRL. Sclera are nonicteric. Neck is supple. No masses. No JVD. Lungs are clear. Cardiac exam shows a regular rate and rhythm. Abdomen is soft. Extremities are without edema. Gait and ROM are intact. No gross neurologic deficits noted.  LABORATORY DATA:   ECG today is normal. Assessment / Plan:

## 2012-07-16 NOTE — Assessment & Plan Note (Signed)
His chest pain symptoms are atypical. They're very brief in duration. They're nonexertional. ECG and exam today are benign. I recommended he go on his vacation and relax. I have given a prescription for Xanax to use on a when necessary basis. I told him if he continued to have this discomfort after his vacation we could consider a stress test but I do not feel that his current pain is cardiac.

## 2012-07-16 NOTE — Patient Instructions (Signed)
Continue your therapy.  If you continue to have chest discomfort after vacation let me know.

## 2012-08-12 ENCOUNTER — Ambulatory Visit: Payer: Federal, State, Local not specified - PPO | Admitting: Cardiology

## 2012-10-27 ENCOUNTER — Other Ambulatory Visit: Payer: Self-pay

## 2012-10-27 MED ORDER — METOPROLOL TARTRATE 25 MG PO TABS: 25.0000 mg | ORAL_TABLET | Freq: Two times a day (BID) | ORAL | Status: DC

## 2012-12-29 ENCOUNTER — Other Ambulatory Visit: Payer: Self-pay | Admitting: *Deleted

## 2012-12-29 MED ORDER — METOPROLOL TARTRATE 25 MG PO TABS: 25.0000 mg | ORAL_TABLET | Freq: Two times a day (BID) | ORAL | Status: DC

## 2013-03-03 ENCOUNTER — Other Ambulatory Visit: Payer: Self-pay | Admitting: *Deleted

## 2013-03-03 MED ORDER — METOPROLOL TARTRATE 25 MG PO TABS: 25.0000 mg | ORAL_TABLET | Freq: Two times a day (BID) | ORAL | Status: DC

## 2013-06-22 ENCOUNTER — Other Ambulatory Visit: Payer: Self-pay

## 2013-06-22 MED ORDER — METOPROLOL TARTRATE 25 MG PO TABS: 25.0000 mg | ORAL_TABLET | Freq: Two times a day (BID) | ORAL | Status: DC

## 2013-06-22 MED ORDER — ATORVASTATIN CALCIUM 80 MG PO TABS: 40.0000 mg | ORAL_TABLET | Freq: Every day | ORAL | Status: DC

## 2013-09-09 ENCOUNTER — Ambulatory Visit (INDEPENDENT_AMBULATORY_CARE_PROVIDER_SITE_OTHER): Payer: Federal, State, Local not specified - PPO | Admitting: *Deleted

## 2013-09-09 ENCOUNTER — Encounter: Payer: Self-pay | Admitting: Nurse Practitioner

## 2013-09-09 ENCOUNTER — Ambulatory Visit (INDEPENDENT_AMBULATORY_CARE_PROVIDER_SITE_OTHER): Payer: Federal, State, Local not specified - PPO | Admitting: Nurse Practitioner

## 2013-09-09 VITALS — BP 122/86 | HR 68 | Ht 67.5 in | Wt 160.0 lb

## 2013-09-09 DIAGNOSIS — I2510 Atherosclerotic heart disease of native coronary artery without angina pectoris: Secondary | ICD-10-CM

## 2013-09-09 DIAGNOSIS — E785 Hyperlipidemia, unspecified: Secondary | ICD-10-CM

## 2013-09-09 LAB — BASIC METABOLIC PANEL
CO2: 28 mEq/L (ref 19–32)
Calcium: 9.5 mg/dL (ref 8.4–10.5)
GFR: 93.85 mL/min (ref >60.00)
Glucose, Bld: 113 mg/dL — ABNORMAL HIGH (ref 70–99)
Potassium: 4.5 mEq/L (ref 3.5–5.1)
Sodium: 142 mEq/L (ref 135–145)

## 2013-09-09 LAB — HEPATIC FUNCTION PANEL
ALT: 29 U/L (ref 0–53)
AST: 21 U/L (ref 0–37)
Albumin: 4.3 g/dL (ref 3.5–5.2)
Total Bilirubin: 0.8 mg/dL (ref 0.3–1.2)

## 2013-09-09 LAB — LIPID PANEL
HDL: 36.4 mg/dL — ABNORMAL LOW (ref >39.00)
Total CHOL/HDL Ratio: 4
Triglycerides: 75 mg/dL (ref 0.0–149.0)
VLDL: 15 mg/dL (ref 0.0–40.0)

## 2013-09-09 NOTE — Progress Notes (Signed)
Mark [name] Date of Birth: [birth year] Medical Record #[id number]  History of Present Illness: Mark [name] is seen back today for a 1 year check. Seen for Dr. Swaziland. He has HLD, CAD, HTN, SVT with remote ablation in 2008 per Dr. Ladona Ridgel and tobacco abuse. Had a STEMI in May of 2012 and was treated with a BMS to the 3rd OM with residual disease - follow up Myoview was satisfactory.   Last seen a year ago with some atypical chest pain. Felt to be ok from our standpoint.   Comes in today. Here with his wife. Feeling pretty good. Has no chest pain. Not short of breath. Feels good on his medicines. Exercising regularly. Not smoking. Very happy with how he is doing. Has had fasting labs prior to this morning's visit.   Current Outpatient Prescriptions  Medication Sig Dispense Refill  . aspirin 81 MG tablet Take 81 mg by mouth daily.        Marland Kitchen atorvastatin (LIPITOR) 80 MG tablet Take 0.5 tablets (40 mg total) by mouth daily.  45 tablet  1  . metoprolol tartrate (LOPRESSOR) 25 MG tablet Take 1 tablet (25 mg total) by mouth 2 (two) times daily.  60 tablet  0  . nitroGLYCERIN (NITROSTAT) 0.4 MG SL tablet Place 1 tablet (0.4 mg total) under the tongue every 5 (five) minutes as needed.  25 tablet  5  . omeprazole (PRILOSEC) 40 MG capsule Take 40 mg by mouth daily.       No current facility-administered medications for this visit.    Allergies  Allergen Reactions  . Penicillins     Past Medical History  Diagnosis Date  . CAD (coronary artery disease)   . Hyperlipidemia   . Hypertension   . Tobacco abuse   . SVT (supraventricular tachycardia)   . STEMI (ST elevation myocardial infarction) 04/23/11  . S/P coronary artery stent placement 04/23/11    BMS to the 3rd OM; has residual disease    Past Surgical History  Procedure Laterality Date  . Svt ablation   2008    Dr. Ladona Ridgel  . Coronary stent placement  04/23/11    BMS to the 3rd OM    History  Smoking status  . Former Smoker  . Quit  date: 04/23/2011  Smokeless tobacco  . Former Neurosurgeon  . Quit date: 04/23/2011    History  Alcohol Use No    Family History  Problem Relation Age of Onset  . Hypertension Mother     Review of Systems: The review of systems is per the HPI.  All other systems were reviewed and are negative.  Physical Exam: BP 122/86  Pulse 68  Ht 5' 7.5" (1.715 m)  Wt 160 lb (72.576 kg)  BMI 24.68 kg/m2 Patient is very pleasant and in no acute distress. Skin is warm and dry. Color is normal.  HEENT is unremarkable. Normocephalic/atraumatic. PERRL. Sclera are nonicteric. Neck is supple. No masses. No JVD. Lungs are clear. Cardiac exam shows a regular rate and rhythm. Abdomen is soft. Extremities are without edema. Gait and ROM are intact. No gross neurologic deficits noted.  LABORATORY DATA: PENDING RESULTS  Lab Results  Component Value Date   WBC 10.9* 04/24/2011   HGB 15.3 04/24/2011   HCT 45.3 04/24/2011   PLT 246 04/24/2011   GLUCOSE 113* 07/07/2012   CHOL 112 07/07/2012   TRIG 66.0 07/07/2012   HDL 34.60* 07/07/2012   LDLCALC 64 07/07/2012   ALT 29 07/07/2012  AST 24 07/07/2012   NA 140 07/07/2012   K 5.0 07/07/2012   CL 108 07/07/2012   CREATININE 0.7 07/07/2012   BUN 10 07/07/2012   CO2 26 07/07/2012   TSH 1.518 04/23/2011   INR 0.94 04/23/2011   HGBA1C  Value: 6.0 (NOTE)                                                                       According to the ADA Clinical Practice Recommendations for 2011, when HbA1c is used as a screening test:   >=6.5%   Diagnostic of Diabetes Mellitus           (if abnormal result  is confirmed)  5.7-6.4%   Increased risk of developing Diabetes Mellitus  References:Diagnosis and Classification of Diabetes Mellitus,Diabetes Care,2011,34(Suppl 1):S62-S69 and Standards of Medical Care in         Diabetes - 2011,Diabetes Care,2011,34  (Suppl 1):S11-S61.* 04/23/2011   ANGIOGRAPHIC DATA - MAY 2012:  The left coronary arises and distributes normally.  The left main coronary  artery is normal.  Left anterior descending artery is calcified in the proximal vessel.  There is diffuse 70% disease in the proximal LAD extending into the  first diagonal. There is some ectasia at the bifurcation of the LAD and  diagonal.  Left circumflex coronary artery is a large vessel which gives rise to 3  marginal vessels before terminating in the AV groove. There is 40%  disease in the proximal circumflex. The first obtuse marginal vessel  has 40% disease proximally. The third obtuse marginal vessel was  occluded proximally.  The right coronary artery arises and distributes normally and has  diffuse 30-40% disease in the midvessel.  At this point, we proceeded with intervention of the third obtuse  marginal vessel which was the culprit lesion. After anticoagulation,  this lesion was crossed with moderate difficulty with a wire. We  predilated the lesion with reperfusion using a 2.5 x 15-mm Apex balloon  up to 4 atmospheres. We then stented the proximal obtuse marginal  vessel using a 2.75 x 20 mm VeriFLEX which was postdilated with a 3.0 x  15-mm [place] balloon up to 14 atmospheres. This yielded excellent  angiographic result with 0% residual stenosis and TIMI grade 3 flow.  At this point, we performed left ventricular angiography in the RAO  view. This demonstrates normal left ventricular size with mid minimal  inferior hypokinesia and normal ejection fraction of 60%.   FINAL INTERPRETATION:  1. 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  2. Successful stenting of the third obtuse marginal vessel using a  bare-metal stent.  3. Normal overall left ventricular function.  PLAN: We will continue therapy with aspirin and Effient. We will focus  on risk factor modification with lipid lowering therapy, tobacco  cessation and blood pressure control. We will need to reassess his LAD  with functional study once he is recovered from his infarct to see if he    needs any additional revascularization.  ______________________________  Peter M. Swaziland, M.D.   Myoview Impression from July 2012  Exercise Capacity: Excellent  BP Response: Normal blood pressure response.  Clinical Symptoms: No chest pain.  ECG Impression: No significant ST segment change suggestive of  ischemia.  Comparison with Prior Nuclear Study: No previous nuclear study performed  Overall Impression: Inferior/inferolateral defect consistent with inferior/inferolateral subendocardial scar With mild periinfarct ischemia. Cannot exclude soft tissue attenuation. LVEF is 67% with normal wall motion.Normal perfusion elsewhere.  Dietrich Pates     Assessment / Plan: 1. CAD - past MI with PCI - doing very well clinically. I have left him on his current regimen. See him back in one year.   2. HTN - BP looks great. No change in therapy.   3. HLD - on statin - lab results are pending.  4. Tobacco abuse - resolved.   Patient is agreeable to this plan and will call if any problems develop in the interim.   Rosalio Macadamia, RN, ANP-C [facility] [address] [place], [place]  [address]

## 2013-09-09 NOTE — Patient Instructions (Addendum)
Continue with your regular medicines  See Dr. Swaziland in one year - call in July for a September visit with him.  Call the [facility] office at [phone] if you have any questions, problems or concerns.

## 2013-09-30 ENCOUNTER — Other Ambulatory Visit: Payer: Self-pay | Admitting: Gastroenterology

## 2013-09-30 DIAGNOSIS — R110 Nausea: Secondary | ICD-10-CM

## 2013-09-30 DIAGNOSIS — K219 Gastro-esophageal reflux disease without esophagitis: Secondary | ICD-10-CM

## 2013-09-30 DIAGNOSIS — Z8719 Personal history of other diseases of the digestive system: Secondary | ICD-10-CM

## 2013-10-07 ENCOUNTER — Ambulatory Visit
Admission: RE | Admit: 2013-10-07 | Discharge: 2013-10-07 | Disposition: A | Discharge status: Home / Self Care | Payer: Federal, State, Local not specified - PPO | Source: Ambulatory Visit | Attending: Gastroenterology | Admitting: Gastroenterology

## 2013-10-07 DIAGNOSIS — R110 Nausea: Secondary | ICD-10-CM

## 2013-10-07 DIAGNOSIS — K219 Gastro-esophageal reflux disease without esophagitis: Secondary | ICD-10-CM

## 2013-10-07 DIAGNOSIS — Z8719 Personal history of other diseases of the digestive system: Secondary | ICD-10-CM

## 2013-11-12 ENCOUNTER — Other Ambulatory Visit: Payer: Self-pay

## 2013-11-12 MED ORDER — METOPROLOL TARTRATE 25 MG PO TABS: 25.0000 mg | ORAL_TABLET | Freq: Two times a day (BID) | ORAL | Status: DC

## 2013-11-12 MED ORDER — ATORVASTATIN CALCIUM 80 MG PO TABS: 40.0000 mg | ORAL_TABLET | Freq: Every day | ORAL | Status: DC

## 2014-01-15 ENCOUNTER — Other Ambulatory Visit: Payer: Self-pay | Admitting: Cardiology

## 2014-03-23 ENCOUNTER — Ambulatory Visit (INDEPENDENT_AMBULATORY_CARE_PROVIDER_SITE_OTHER): Payer: Federal, State, Local not specified - PPO | Admitting: Nurse Practitioner

## 2014-03-23 ENCOUNTER — Encounter: Payer: Self-pay | Admitting: Nurse Practitioner

## 2014-03-23 VITALS — BP 124/80 | Ht 67.0 in | Wt 156.0 lb

## 2014-03-23 DIAGNOSIS — R079 Chest pain, unspecified: Secondary | ICD-10-CM

## 2014-03-23 DIAGNOSIS — I259 Chronic ischemic heart disease, unspecified: Secondary | ICD-10-CM

## 2014-03-23 NOTE — Progress Notes (Signed)
Mark SproutJeffery [name] Date of Birth: Jun 25, 1961 Medical Record #[id number]  History of Present Illness: Mark [name] is seen back today for a work in visit. Seen for Dr. SwazilandJordan. He has HLD, CAD, HTN, SVG with remote ablation in 2008 per Dr. Ladona Ridgelaylor and tobacco abuse. Had STEMI in May of 2012 and was treated with BMS to the 3rd OM with residual disease noted on cath - follow up Myoview in June of 2012 was satisfactory.  He has not been seen since September of 2014 - was doing ok at that time. Was not smoking.  Comes in today. Here alone. Called with complaints of recurrent chest pain.  Notes that he has had more issues with his reflux - was feeling nauseated - had his colonoscopy and EGD and now on higher doses of PPI therapy. This has helped his nausea. Over the past couple of months he has noted a sharp aching fleeting pain in his chest - does not feel like his prior chest pain syndrome. Not exertional. Occurs at rest. Does not wake him up at night. Not short of breath. Not dizzy or lightheaded.Took a NTG to "just see what would happen" - did not help. He remains very active and has no spells with his activities. He remains off of his cigarettes.    Current Outpatient Prescriptions  Medication Sig Dispense Refill  . aspirin 81 MG tablet Take 81 mg by mouth daily.        Marland Kitchen. atorvastatin (LIPITOR) 80 MG tablet Take 0.5 tablets (40 mg total) by mouth daily.  45 tablet  6  . metoprolol tartrate (LOPRESSOR) 25 MG tablet Take 1 tablet (25 mg total) by mouth 2 (two) times daily.  60 tablet  6  . NITROSTAT 0.4 MG SL tablet TAKE 1 TABLET BY MOUTH EVERY 5 MINS AN FOR UP TO 3 DOSES  25 tablet  6  . omeprazole (PRILOSEC) 40 MG capsule Take 40 mg by mouth 2 (two) times daily.        No current facility-administered medications for this visit.    Allergies  Allergen Reactions  . Penicillins     Past Medical History  Diagnosis Date  . CAD (coronary artery disease)   . Hyperlipidemia   . Hypertension   . Tobacco  abuse   . SVT (supraventricular tachycardia)   . STEMI (ST elevation myocardial infarction) 04/23/11  . S/P coronary artery stent placement 04/23/11    BMS to the 3rd OM; has residual disease    Past Surgical History  Procedure Laterality Date  . Svt ablation   2008    Dr. Ladona Ridgelaylor  . Coronary stent placement  04/23/11    BMS to the 3rd OM    History  Smoking status  . Former Smoker  . Quit date: 04/23/2011  Smokeless tobacco  . Former NeurosurgeonUser  . Quit date: 04/23/2011    History  Alcohol Use No    Family History  Problem Relation Age of Onset  . Hypertension Mother     Review of Systems: The review of systems is per the HPI. All other systems were reviewed and are negative.  Physical Exam: BP 124/80  Ht 5\' 7"  (1.702 m)  Wt 156 lb (70.761 kg)  BMI 24.43 kg/m2 Patient is very pleasant and in no acute distress. Skin is warm and dry. Color is normal.  HEENT is unremarkable. Normocephalic/atraumatic. PERRL. Sclera are nonicteric. Neck is supple. No masses. No JVD. Lungs are clear. Cardiac exam shows a regular rate and  rhythm. Abdomen is soft. Extremities are without edema. Gait and ROM are intact. No gross neurologic deficits noted.   LABORATORY DATA: EKG today shows sinus rhythm. No acute changes. Tracing unchanged.   Lab Results  Component Value Date   WBC 10.9* 04/24/2011   HGB 15.3 04/24/2011   HCT 45.3 04/24/2011   PLT 246 04/24/2011   GLUCOSE 113* 09/09/2013   CHOL 134 09/09/2013   TRIG 75.0 09/09/2013   HDL 36.40* 09/09/2013   LDLCALC 83 09/09/2013   ALT 29 09/09/2013   AST 21 09/09/2013   NA 142 09/09/2013   K 4.5 09/09/2013   CL 109 09/09/2013   CREATININE 0.9 09/09/2013   BUN 20 09/09/2013   CO2 28 09/09/2013   TSH 1.518 04/23/2011   INR 0.94 04/23/2011   HGBA1C  Value: 6.0 (NOTE)                                                                       According to the ADA Clinical Practice Recommendations for 2011, when HbA1c is used as a screening test:   >=6.5%   Diagnostic of  Diabetes Mellitus           (if abnormal result  is confirmed)  5.7-6.4%   Increased risk of developing Diabetes Mellitus  References:Diagnosis and Classification of Diabetes Mellitus,Diabetes Care,2011,34(Suppl 1):S62-S69 and Standards of Medical Care in         Diabetes - 2011,Diabetes Care,2011,34  (Suppl 1):S11-S61.* 04/23/2011   ANGIOGRAPHIC DATA - MAY 2012:  The left coronary arises and distributes normally.  The left main coronary artery is normal.  Left anterior descending artery is calcified in the proximal vessel.  There is diffuse 70% disease in the proximal LAD extending into the  first diagonal. There is some ectasia at the bifurcation of the LAD and  diagonal.  Left circumflex coronary artery is a large vessel which gives rise to 3  marginal vessels before terminating in the AV groove. There is 40%  disease in the proximal circumflex. The first obtuse marginal vessel  has 40% disease proximally. The third obtuse marginal vessel was  occluded proximally.  The right coronary artery arises and distributes normally and has  diffuse 30-40% disease in the midvessel.  At this point, we proceeded with intervention of the third obtuse  marginal vessel which was the culprit lesion. After anticoagulation,  this lesion was crossed with moderate difficulty with a wire. We  predilated the lesion with reperfusion using a 2.5 x 15-mm Apex balloon  up to 4 atmospheres. We then stented the proximal obtuse marginal  vessel using a 2.75 x 20 mm VeriFLEX which was postdilated with a 3.0 x  15-mm [place] Trek balloon up to 14 atmospheres. This yielded excellent  angiographic result with 0% residual stenosis and TIMI grade 3 flow.  At this point, we performed left ventricular angiography in the RAO  view. This demonstrates normal left ventricular size with mid minimal  inferior hypokinesia and normal ejection fraction of 60%.  FINAL INTERPRETATION:  1. Two-vessel obstructive atherosclerotic coronary  artery disease.  2. Successful stenting of the third obtuse marginal vessel using a  bare-metal stent.  3. Normal overall left ventricular function.  PLAN: We will continue therapy with  aspirin and Effient. We will focus  on risk factor modification with lipid lowering therapy, tobacco  cessation and blood pressure control. We will need to reassess his LAD  with functional study once he is recovered from his infarct to see if he  needs any additional revascularization.  ______________________________  Peter M. Swaziland, M.D.    Myoview Impression from July 2012  Exercise Capacity: Excellent  BP Response: Normal blood pressure response.  Clinical Symptoms: No chest pain.  ECG Impression: No significant ST segment change suggestive of ischemia.  Comparison with Prior Nuclear Study: No previous nuclear study performed  Overall Impression: Inferior/inferolateral defect consistent with inferior/inferolateral subendocardial scar With mild periinfarct ischemia. Cannot exclude soft tissue attenuation. LVEF is 67% with normal wall motion.Normal perfusion elsewhere.  Dietrich Pates    Assessment / Plan:  1. CAD - with past PCI - known residual disease - now with atypical chest pain - he is worried and concerned about his residual disease - will check EKG today - if this looks ok - will update his Myoview.   2. HTN - BP is great. No change on current therapy.    3. HLD - on statin therapy. Has just had recent labs with his physical per primary care.   4. Tobacco abuse - resolved.   5. GERD - now on higher doses of PPI therapy.  Will proceed with Myoview. Tentatively see him back as planned unless this is abnormal.   Patient is agreeable to this plan and will call if any problems develop in the interim.   Rosalio Macadamia, RN, ANP-C [facility] [address] [place], [place]  [address] [phone]

## 2014-03-23 NOTE — Patient Instructions (Addendum)
Stay on your current medicines  We will arrange for a repeat stress test (stress Myoview)  Tentatively see back in September as planned.  Call the [facility] office at [phone] if you have any questions, problems or concerns.

## 2014-04-05 ENCOUNTER — Ambulatory Visit (HOSPITAL_COMMUNITY): Payer: Federal, State, Local not specified - PPO | Attending: Cardiovascular Disease | Admitting: Radiology

## 2014-04-05 VITALS — BP 126/71 | Ht 67.0 in | Wt 152.0 lb

## 2014-04-05 DIAGNOSIS — I259 Chronic ischemic heart disease, unspecified: Secondary | ICD-10-CM

## 2014-04-05 DIAGNOSIS — R079 Chest pain, unspecified: Secondary | ICD-10-CM

## 2014-04-05 DIAGNOSIS — I2510 Atherosclerotic heart disease of native coronary artery without angina pectoris: Secondary | ICD-10-CM

## 2014-04-05 MED ORDER — TECHNETIUM TC 99M SESTAMIBI GENERIC - CARDIOLITE
11.0000 | Freq: Once | INTRAVENOUS | Status: AC | PRN
  Administered 2014-04-05: 11 via INTRAVENOUS

## 2014-04-05 MED ORDER — TECHNETIUM TC 99M SESTAMIBI GENERIC - CARDIOLITE
33.0000 | Freq: Once | INTRAVENOUS | Status: AC | PRN
  Administered 2014-04-05: 33 via INTRAVENOUS

## 2014-04-05 NOTE — Progress Notes (Signed)
[facility] SITE 3 NUCLEAR MED [address] [place], [place] [address] [phone]    Cardiology Nuclear Med Study  Mark SproutJeffery [name] is a 53 y.o. male     MRN : [id number]     DOB: 1961-10-13  Procedure Date: 04/05/2014  Nuclear Med Background Indication for Stress Test:  Evaluation for Ischemia and Stent Patency History:  07/08/11 MPI: EF: 67% prior infarct (-) ischemia CAD, MI-CATH-STENT-OM, Ablation SVT,  Cardiac Risk Factors: Family History - CAD, History of Smoking, Hypertension and Lipids  Symptoms:  Chest Pain   Nuclear Pre-Procedure Caffeine/Decaff Intake:  None > 12 hrs NPO After: 6:00pm   Lungs:  clear O2 Sat: 97-99% on room air. IV 0.9% NS with Angio Cath:  22g  IV Site: R Hand x 1, tolerated well IV Started by:  Mark HongPatsy Beaux Wedemeyer, RN  Chest Size (in):  38 Cup Size: n/a  Height: 5\' 7"  (1.702 m)  Weight:  152 lb (68.947 kg)  BMI:  Body mass index is 23.8 kg/(m^2). Tech Comments:  Patient held Lopressor x 24 hrs.    Nuclear Med Study 1 or 2 day study: 1 day  Stress Test Type:  Stress  Reading MD: N/A  Order Authorizing Provider:  Peter SwazilandJordan, MD, Mark FredricksonLori Gerhardt, NP  Resting Radionuclide: Technetium 2663m Sestamibi  Resting Radionuclide Dose: 11.0 mCi   Stress Radionuclide:  Technetium 5463m Sestamibi  Stress Radionuclide Dose: 33.0 mCi           Stress Protocol Rest HR: 85 Stress HR: 181  Rest BP: 126/71 Stress BP: 174/83  Exercise Time (min): 7:31 METS: 9.30   Predicted Max HR: 167 bpm % Max HR: 108.38 bpm Rate Pressure Product: [id number]   Dose of Adenosine (mg):  n/a Dose of Lexiscan: n/a mg  Dose of Atropine (mg): n/a Dose of Dobutamine: n/a mcg/kg/min (at max HR)  Stress Test Technologist: Mark [name], EMT-P  Nuclear Technologist:  Mark [name], CNMT     Rest Procedure:  Myocardial perfusion imaging was performed at rest 45 minutes following the intravenous administration of Technetium 5363m Sestamibi. Rest ECG: NSR - Normal EKG  Stress  Procedure:  The patient exercised on the treadmill utilizing the Bruce Protocol for 7:31 minutes. The patient stopped due to fatigue and denied any chest pain.  Technetium 2163m Sestamibi was injected at peak exercise and myocardial perfusion imaging was performed after a brief delay. Stress ECG: 0.5 mm downsloaping ST depression sin V4 that resolve quickly in recovery but then recur 6 minutes later.   QPS Raw Data Images:  Normal; no motion artifact; normal heart/lung ratio. Stress Images:  There is a large size severe severity perfusion defect in the basal and mid inferior, inefrolateral walls, basal and mid anterolateral and inferoseptal walls.  Rest Images:  There is a large size severe severity perfusion defect in the basal and mid inferior, inefrolateral walls, basal inferoseptal and anterolateral walls.  Subtraction (SDS):  A large non-reversible defect in the basal and mid inferior and inferolateral walls and medium size moderate severity reversible defect in the mid inferoseptal and mid anterolateral areas.   Transient Ischemic Dilatation (Normal <1.22):  0.76 Lung/Heart Ratio (Normal <0.45):  0.28  Quantitative Gated Spect Images QGS EDV:  74 ml QGS ESV:  14 ml  Impression Exercise Capacity:  Good exercise capacity. BP Response:  Normal blood pressure response. Clinical Symptoms:  Fatigue. ECG Impression:  Significant ST abnormalities consistent with ischemia. Comparison with Prior Nuclear Study: No images to compare  Overall  Impression:  Intermediate risk stress nuclear study with a large scar in the RCA?LCX territory with moderate periinfarct ischemia (SDS 7)..  LV Ejection Fraction: > 70%.  LV Wall Motion:  Hypokinesis of the basal and mid inferior and inferolateral walls.   Mark [name] 04/05/2014

## 2014-04-08 ENCOUNTER — Telehealth: Payer: Self-pay | Admitting: Nurse Practitioner

## 2014-04-08 NOTE — Telephone Encounter (Signed)
I have asked Dr. SwazilandJordan to review the study - I am waiting to hear back from him and we will be calling with a report.

## 2014-04-08 NOTE — Telephone Encounter (Signed)
New Message:   Pt wants a call back with his recent test results

## 2014-04-08 NOTE — Telephone Encounter (Signed)
Pt and wife called for the stress test results done on 4/21. Pt's wife  is aware that stress test has not been  Review per MD/NP once it is done we will call him/her back.

## 2014-04-08 NOTE — Telephone Encounter (Signed)
Pt and wife aware, that we will call him after Dr. SwazilandJordan review the study.

## 2014-04-11 ENCOUNTER — Other Ambulatory Visit: Payer: Self-pay | Admitting: Nurse Practitioner

## 2014-04-11 ENCOUNTER — Encounter: Payer: Self-pay | Admitting: *Deleted

## 2014-04-11 ENCOUNTER — Ambulatory Visit
Admission: RE | Admit: 2014-04-11 | Discharge: 2014-04-11 | Disposition: A | Discharge status: Home / Self Care | Payer: Federal, State, Local not specified - PPO | Source: Ambulatory Visit | Attending: Nurse Practitioner | Admitting: Nurse Practitioner

## 2014-04-11 ENCOUNTER — Ambulatory Visit (INDEPENDENT_AMBULATORY_CARE_PROVIDER_SITE_OTHER): Payer: Federal, State, Local not specified - PPO | Admitting: Nurse Practitioner

## 2014-04-11 ENCOUNTER — Encounter: Payer: Self-pay | Admitting: Nurse Practitioner

## 2014-04-11 VITALS — BP 124/78 | HR 80 | Wt 155.0 lb

## 2014-04-11 DIAGNOSIS — R079 Chest pain, unspecified: Secondary | ICD-10-CM

## 2014-04-11 DIAGNOSIS — I259 Chronic ischemic heart disease, unspecified: Secondary | ICD-10-CM

## 2014-04-11 DIAGNOSIS — Z01810 Encounter for preprocedural cardiovascular examination: Secondary | ICD-10-CM

## 2014-04-11 LAB — CBC
HCT: 43.6 % (ref 39.0–52.0)
Hemoglobin: 14.4 g/dL (ref 13.0–17.0)
MCHC: 33 g/dL (ref 30.0–36.0)
MCV: 88.6 fl (ref 78.0–100.0)
Platelets: 240 10*3/uL (ref 150.0–400.0)
RBC: 4.92 Mil/uL (ref 4.22–5.81)
RDW: 13.6 % (ref 11.5–14.6)
WBC: 6.5 10*3/uL (ref 4.5–10.5)

## 2014-04-11 LAB — APTT: aPTT: 25.9 s (ref 21.7–28.8)

## 2014-04-11 LAB — BASIC METABOLIC PANEL
BUN: 18 mg/dL (ref 6–23)
CO2: 27 mEq/L (ref 19–32)
Calcium: 9 mg/dL (ref 8.4–10.5)
Chloride: 106 mEq/L (ref 96–112)
Creatinine, Ser: 0.7 mg/dL (ref 0.4–1.5)
GFR: 121.14 mL/min (ref >60.00)
Glucose, Bld: 120 mg/dL — ABNORMAL HIGH (ref 70–99)
Potassium: 3.7 mEq/L (ref 3.5–5.1)
Sodium: 139 mEq/L (ref 135–145)

## 2014-04-11 LAB — PROTIME-INR
INR: 1 ratio (ref 0.8–1.0)
Prothrombin Time: 11.3 s (ref 9.6–13.1)

## 2014-04-11 NOTE — Patient Instructions (Signed)
We will check labs today  [facility] on the first floor for a chest Xray - you may walk in.   You are scheduled for a cardiac catheterization on Wednesday, April 29th at 8:30amwith Dr. SwazilandJordan or associate.  [facility] 2[facility] Floor Short Stay on Wednesday, April 29th at 6:30am.  Enter thru the [employer] [facility] entrance A No food or drink after midnight on Tuesday. You may take your medications with a sip of water on the day of your procedure.   Call the [facility] office at [phone] if you have any questions, problems or concerns.     Coronary Angiography Coronary angiography is an X-ray procedure used to look at the arteries in the heart. In this procedure, a dye (contrast dye) is injected through a long, hollow tube (catheter). The catheter is about the size of a piece of cooked spaghetti and is inserted through your groin, wrist, or arm. The dye is injected into each artery, and X-rays are then taken to show if there is a blockage in the arteries of your heart. LET [facility]YOUR HEALTH CARE PROVIDER KNOW ABOUT:  Any allergies you have, including allergies to shellfish or contrast dye.   All medicines you are taking, including vitamins, herbs, eye drops, creams, and over-the-counter medicines.   Previous problems you or members of your family have had with the use of anesthetics.   Any blood disorders you have.   Previous surgeries you have had.  History of kidney problems or failure.   Other medical conditions you have. RISKS AND COMPLICATIONS  Generally, coronary angiography is a safe procedure. However, as with any procedure, complications can occur. Possible complications include:  Allergic reaction to the dye.  Bleeding from the access site or other locations.  Kidney injury, especially in people with impaired kidney function.  Stroke (rare).  Heart attack (rare). BEFORE THE PROCEDURE   Do not eat  or drink anything after midnight the night before the procedure, or as directed by your health care provider.   Ask your health care provider if it is okay to take any needed medicines with a sip of water.  PROCEDURE  You may be given a medicine to help you relax (sedative) before the procedure. This medicine is given through an intravenous (IV) access tube that is inserted into one of your veins.   The area where the catheter will be inserted is washed and shaved. This is usually done in the groin but may be done in the fold of your arm (near your elbow) or in the wrist.   A medicine will be given to numb the area where the catheter will be inserted (local anesthetic).   The health care provider will insert the catheter into an artery. The catheter is guided by using a special type of X-ray (fluoroscopy) of the blood vessel being examined.   A special dye is then injected into the catheter, and X-rays are taken. The dye helps to show where any narrowing or blockages are located in the heart arteries.  AFTER THE PROCEDURE   If the procedure is done through the leg, you will be kept in bed lying flat for several hours. You will be instructed to not bend or cross your legs.  The insertion site will be checked frequently.   The pulse in your feet or wrist will be checked frequently.   Additional blood tests, X-rays, and an electrocardiogram may be  done.   You may need to stay in the hospital overnight for observation.  Document Released: 06/08/2003 Document Revised: 08/04/2013 Document Reviewed: 04/26/2013 [facility] Patient Information 2014 [place].

## 2014-04-11 NOTE — Progress Notes (Signed)
Mark [name] Date of Birth: 09/25/1961 Medical Record #[id number]  History of Present Illness: Mark [name] is seen back today for a pre cath visit. Seen for Dr. Swaziland. He has HLD, CAD, HTN, SVG with remote ablation in 2008 per Dr. Ladona Ridgel and tobacco abuse. Had STEMI in May of 2012 and was treated with BMS to the 3rd OM with residual disease noted on cath - follow up Myoview in June of 2012 was satisfactory.   Seen earlier this month with atypical chest pain. Very worried about his known residual disease. We updated his Myoview. Dr. Swaziland has reviewed - has recommended cath.   Comes in today. Here with his wife today. Still worried. Still with an achy feeling in his chest - more with rest than with exertion but has had with exertion. Still does not feel like his prior chest pain syndrome but "I know something is not right in there". No NTG use.   Current Outpatient Prescriptions  Medication Sig Dispense Refill  . aspirin 81 MG tablet Take 81 mg by mouth daily.        Marland Kitchen atorvastatin (LIPITOR) 80 MG tablet Take 0.5 tablets (40 mg total) by mouth daily.  45 tablet  6  . metoprolol tartrate (LOPRESSOR) 25 MG tablet Take 1 tablet (25 mg total) by mouth 2 (two) times daily.  60 tablet  6  . NITROSTAT 0.4 MG SL tablet TAKE 1 TABLET BY MOUTH EVERY 5 MINS AN FOR UP TO 3 DOSES  25 tablet  6  . omeprazole (PRILOSEC) 40 MG capsule Take 40 mg by mouth 2 (two) times daily.        No current facility-administered medications for this visit.    Allergies  Allergen Reactions  . Penicillins     Past Medical History  Diagnosis Date  . CAD (coronary artery disease)   . Hyperlipidemia   . Hypertension   . Tobacco abuse   . SVT (supraventricular tachycardia)   . STEMI (ST elevation myocardial infarction) 04/23/11  . S/P coronary artery stent placement 04/23/11    BMS to the 3rd OM; has residual disease    Past Surgical History  Procedure Laterality Date  . Svt ablation   2008    Dr. Ladona Ridgel  . Coronary  stent placement  04/23/11    BMS to the 3rd OM    History  Smoking status  . Former Smoker  . Quit date: 04/23/2011  Smokeless tobacco  . Former Neurosurgeon  . Quit date: 04/23/2011    History  Alcohol Use No    Family History  Problem Relation Age of Onset  . Hypertension Mother     Review of Systems: The review of systems is per the HPI.  All other systems were reviewed and are negative.  Physical Exam: BP 124/78  Pulse 80  Wt 155 lb (70.308 kg) Patient is very pleasant and in no acute distress. Skin is warm and dry. Color is normal.  HEENT is unremarkable. Normocephalic/atraumatic. PERRL. Sclera are nonicteric. Neck is supple. No masses. No JVD. Lungs are clear. Cardiac exam shows a regular rate and rhythm. Abdomen is soft. Extremities are without edema. Gait and ROM are intact. No gross neurologic deficits noted.  LABORATORY DATA: Lab Results  Component Value Date   WBC 10.9* 04/24/2011   HGB 15.3 04/24/2011   HCT 45.3 04/24/2011   PLT 246 04/24/2011   GLUCOSE 113* 09/09/2013   CHOL 134 09/09/2013   TRIG 75.0 09/09/2013   HDL 36.40* 09/09/2013  LDLCALC 83 09/09/2013   ALT 29 09/09/2013   AST 21 09/09/2013   NA 142 09/09/2013   K 4.5 09/09/2013   CL 109 09/09/2013   CREATININE 0.9 09/09/2013   BUN 20 09/09/2013   CO2 28 09/09/2013   TSH 1.518 04/23/2011   INR 0.94 04/23/2011   HGBA1C  Value: 6.0 (NOTE)                                                                       According to the ADA Clinical Practice Recommendations for 2011, when HbA1c is used as a screening test:   >=6.5%   Diagnostic of Diabetes Mellitus           (if abnormal result  is confirmed)  5.7-6.4%   Increased risk of developing Diabetes Mellitus  References:Diagnosis and Classification of Diabetes Mellitus,Diabetes Care,2011,34(Suppl 1):S62-S69 and Standards of Medical Care in         Diabetes - 2011,Diabetes Care,2011,34  (Suppl 1):S11-S61.* 04/23/2011   Myoview Impression  Exercise Capacity: Good exercise capacity.    BP Response: Normal blood pressure response.  Clinical Symptoms: Fatigue.  ECG Impression: Significant ST abnormalities consistent with ischemia.  Comparison with Prior Nuclear Study: No images to compare   Overall Impression: Intermediate risk stress nuclear study with a large scar in the RCA?LCX territory with moderate periinfarct ischemia (SDS 7)..  LV Ejection Fraction: > 70%. LV Wall Motion: Hypokinesis of the basal and mid inferior and inferolateral walls.  Lars MassonKatarina H Nelson  04/05/2014   ANGIOGRAPHIC DATA - MAY 2012:  The left coronary arises and distributes normally.  The left main coronary artery is normal.  Left anterior descending artery is calcified in the proximal vessel.  There is diffuse 70% disease in the proximal LAD extending into the  first diagonal. There is some ectasia at the bifurcation of the LAD and  diagonal.  Left circumflex coronary artery is a large vessel which gives rise to 3  marginal vessels before terminating in the AV groove. There is 40%  disease in the proximal circumflex. The first obtuse marginal vessel  has 40% disease proximally. The third obtuse marginal vessel was  occluded proximally.  The right coronary artery arises and distributes normally and has  diffuse 30-40% disease in the midvessel.  At this point, we proceeded with intervention of the third obtuse  marginal vessel which was the culprit lesion. After anticoagulation,  this lesion was crossed with moderate difficulty with a wire. We  predilated the lesion with reperfusion using a 2.5 x 15-mm Apex balloon  up to 4 atmospheres. We then stented the proximal obtuse marginal  vessel using a 2.75 x 20 mm VeriFLEX which was postdilated with a 3.0 x  15-mm [place] Trek balloon up to 14 atmospheres. This yielded excellent  angiographic result with 0% residual stenosis and TIMI grade 3 flow.  At this point, we performed left ventricular angiography in the RAO  view. This demonstrates normal left  ventricular size with mid minimal  inferior hypokinesia and normal ejection fraction of 60%.  FINAL INTERPRETATION:  1. 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  2. Successful stenting of the third obtuse marginal vessel using a  bare-metal stent.  3. Normal overall left ventricular function.  PLAN: We will continue therapy with aspirin and Effient. We will focus  on risk factor modification with lipid lowering therapy, tobacco  cessation and blood pressure control. We will need to reassess his LAD  with functional study once he is recovered from his infarct to see if he  needs any additional revascularization.  ______________________________  Peter M. SwazilandJordan, M.D.    Assessment / Plan: 1. CAD - with past PCI - known residual disease - now with atypical chest pain - s/p Myoview - reviewed by Dr. SwazilandJordan - will proceed on with cardiac catheterization - procedure is reviewed and he is willing to proceed. He is scheduled for Wednesday, April 29th.   2. HTN - BP is great. No change on current therapy.   3. HLD - on statin therapy. Has just had recent labs with his physical per primary care.   4. Tobacco abuse - resolved.   5. GERD - now on higher doses of PPI therapy.   Patient is agreeable to this plan and will call if any problems develop in the interim.   Rosalio MacadamiaLori C. Trevione Wert, RN, ANP-C  [facility]  [address]  [place], [place] [address]  [phone]

## 2014-04-13 ENCOUNTER — Ambulatory Visit (HOSPITAL_COMMUNITY)
Admission: RE | Admit: 2014-04-13 | Discharge: 2014-04-14 | Disposition: A | Discharge status: Home / Self Care | Payer: Federal, State, Local not specified - PPO | Source: Ambulatory Visit | Attending: Cardiology | Admitting: Cardiology

## 2014-04-13 ENCOUNTER — Encounter (HOSPITAL_COMMUNITY): Payer: Self-pay | Admitting: Pharmacy Technician

## 2014-04-13 ENCOUNTER — Encounter (HOSPITAL_COMMUNITY)
Admission: RE | Disposition: A | Discharge status: Home / Self Care | Payer: Self-pay | Source: Ambulatory Visit | Attending: Cardiology

## 2014-04-13 ENCOUNTER — Encounter (HOSPITAL_COMMUNITY): Payer: Self-pay | Admitting: *Deleted

## 2014-04-13 DIAGNOSIS — I2510 Atherosclerotic heart disease of native coronary artery without angina pectoris: Secondary | ICD-10-CM

## 2014-04-13 DIAGNOSIS — K219 Gastro-esophageal reflux disease without esophagitis: Secondary | ICD-10-CM | POA: Insufficient documentation

## 2014-04-13 DIAGNOSIS — E785 Hyperlipidemia, unspecified: Secondary | ICD-10-CM | POA: Insufficient documentation

## 2014-04-13 DIAGNOSIS — I498 Other specified cardiac arrhythmias: Secondary | ICD-10-CM | POA: Insufficient documentation

## 2014-04-13 DIAGNOSIS — Z7982 Long term (current) use of aspirin: Secondary | ICD-10-CM | POA: Insufficient documentation

## 2014-04-13 DIAGNOSIS — Z87891 Personal history of nicotine dependence: Secondary | ICD-10-CM | POA: Insufficient documentation

## 2014-04-13 DIAGNOSIS — I252 Old myocardial infarction: Secondary | ICD-10-CM | POA: Insufficient documentation

## 2014-04-13 DIAGNOSIS — I209 Angina pectoris, unspecified: Secondary | ICD-10-CM | POA: Diagnosis present

## 2014-04-13 DIAGNOSIS — Z9861 Coronary angioplasty status: Secondary | ICD-10-CM | POA: Insufficient documentation

## 2014-04-13 DIAGNOSIS — Z01810 Encounter for preprocedural cardiovascular examination: Secondary | ICD-10-CM

## 2014-04-13 DIAGNOSIS — I10 Essential (primary) hypertension: Secondary | ICD-10-CM | POA: Insufficient documentation

## 2014-04-13 HISTORY — PX: PERCUTANEOUS CORONARY STENT INTERVENTION (PCI-S): SHX5485

## 2014-04-13 HISTORY — PX: LEFT HEART CATHETERIZATION WITH CORONARY ANGIOGRAM: SHX5451

## 2014-04-13 LAB — POCT ACTIVATED CLOTTING TIME
Activated Clotting Time: 265 seconds
Activated Clotting Time: 232 seconds

## 2014-04-13 SURGERY — LEFT HEART CATHETERIZATION WITH CORONARY ANGIOGRAM
Anesthesia: LOCAL | Site: Hand | Laterality: Right

## 2014-04-13 MED ORDER — HEPARIN (PORCINE) IN NACL 2-0.9 UNIT/ML-% IJ SOLN
INTRAMUSCULAR | Status: AC
  Filled 2014-04-13: qty 500

## 2014-04-13 MED ORDER — SODIUM CHLORIDE 0.9 % IV SOLN: 250.0000 mL | INTRAVENOUS | Status: DC | PRN

## 2014-04-13 MED ORDER — SODIUM CHLORIDE 0.9 % IJ SOLN: 3.0000 mL | INTRAMUSCULAR | Status: DC | PRN

## 2014-04-13 MED ORDER — FENTANYL CITRATE 0.05 MG/ML IJ SOLN
INTRAMUSCULAR | Status: AC
  Filled 2014-04-13: qty 2

## 2014-04-13 MED ORDER — MORPHINE SULFATE 2 MG/ML IJ SOLN
2.0000 mg | INTRAMUSCULAR | Status: DC | PRN
  Administered 2014-04-13 (×5): 2 mg via INTRAVENOUS
  Filled 2014-04-13 (×4): qty 1

## 2014-04-13 MED ORDER — SODIUM CHLORIDE 0.9 % IJ SOLN: 3.0000 mL | Freq: Two times a day (BID) | INTRAMUSCULAR | Status: DC

## 2014-04-13 MED ORDER — BIVALIRUDIN 250 MG IV SOLR
INTRAVENOUS | Status: AC
  Filled 2014-04-13: qty 250

## 2014-04-13 MED ORDER — DIAZEPAM 5 MG PO TABS
10.0000 mg | ORAL_TABLET | ORAL | Status: AC
  Administered 2014-04-13: 10 mg via ORAL
  Filled 2014-04-13: qty 2

## 2014-04-13 MED ORDER — TICAGRELOR 90 MG PO TABS
ORAL_TABLET | ORAL | Status: AC
  Filled 2014-04-13: qty 1

## 2014-04-13 MED ORDER — TICAGRELOR 90 MG PO TABS
90.0000 mg | ORAL_TABLET | Freq: Two times a day (BID) | ORAL | Status: DC
  Administered 2014-04-13 – 2014-04-14 (×2): 90 mg via ORAL
  Filled 2014-04-13 (×3): qty 1

## 2014-04-13 MED ORDER — PANTOPRAZOLE SODIUM 40 MG PO TBEC
40.0000 mg | DELAYED_RELEASE_TABLET | Freq: Every day | ORAL | Status: DC
  Administered 2014-04-14: 40 mg via ORAL
  Filled 2014-04-13 (×2): qty 1

## 2014-04-13 MED ORDER — ONDANSETRON HCL 4 MG/2ML IJ SOLN
4.0000 mg | Freq: Three times a day (TID) | INTRAMUSCULAR | Status: DC | PRN
  Administered 2014-04-13: 4 mg via INTRAVENOUS
  Filled 2014-04-13: qty 2

## 2014-04-13 MED ORDER — ASPIRIN 81 MG PO TABS: 81.0000 mg | ORAL_TABLET | Freq: Every day | ORAL | Status: DC

## 2014-04-13 MED ORDER — NITROGLYCERIN 0.2 MG/ML ON CALL CATH LAB
INTRAVENOUS | Status: AC
  Filled 2014-04-13: qty 1

## 2014-04-13 MED ORDER — SODIUM CHLORIDE 0.9 % IV SOLN
INTRAVENOUS | Status: DC
  Administered 2014-04-13: 07:00:00 via INTRAVENOUS

## 2014-04-13 MED ORDER — ASPIRIN 81 MG PO CHEW
81.0000 mg | CHEWABLE_TABLET | Freq: Every day | ORAL | Status: DC
  Administered 2014-04-14: 81 mg via ORAL
  Filled 2014-04-13 (×2): qty 1

## 2014-04-13 MED ORDER — NITROGLYCERIN 0.4 MG SL SUBL: 0.4000 mg | SUBLINGUAL_TABLET | SUBLINGUAL | Status: DC | PRN

## 2014-04-13 MED ORDER — LIDOCAINE HCL (PF) 1 % IJ SOLN
INTRAMUSCULAR | Status: AC
Start: 2014-04-13 — End: 2014-04-13
  Filled 2014-04-13: qty 30

## 2014-04-13 MED ORDER — SODIUM CHLORIDE 0.9 % IV SOLN
1.0000 mL/kg/h | INTRAVENOUS | Status: AC
  Administered 2014-04-13: 1 mL/kg/h via INTRAVENOUS

## 2014-04-13 MED ORDER — HEPARIN SODIUM (PORCINE) 1000 UNIT/ML IJ SOLN
INTRAMUSCULAR | Status: AC
  Filled 2014-04-13: qty 1

## 2014-04-13 MED ORDER — ATORVASTATIN CALCIUM 40 MG PO TABS
40.0000 mg | ORAL_TABLET | Freq: Every day | ORAL | Status: DC
  Administered 2014-04-14: 40 mg via ORAL
  Filled 2014-04-13: qty 1

## 2014-04-13 MED ORDER — MORPHINE SULFATE 2 MG/ML IJ SOLN
INTRAMUSCULAR | Status: AC
  Filled 2014-04-13: qty 1

## 2014-04-13 MED ORDER — ASPIRIN 81 MG PO CHEW: 81.0000 mg | CHEWABLE_TABLET | ORAL | Status: DC

## 2014-04-13 MED ORDER — METOPROLOL TARTRATE 25 MG PO TABS
25.0000 mg | ORAL_TABLET | Freq: Two times a day (BID) | ORAL | Status: DC
  Administered 2014-04-13 – 2014-04-14 (×2): 25 mg via ORAL
  Filled 2014-04-13 (×3): qty 1

## 2014-04-13 MED ORDER — MIDAZOLAM HCL 2 MG/2ML IJ SOLN
INTRAMUSCULAR | Status: AC
  Filled 2014-04-13: qty 2

## 2014-04-13 MED ORDER — OXYCODONE-ACETAMINOPHEN 5-325 MG PO TABS
1.0000 | ORAL_TABLET | ORAL | Status: DC | PRN
  Administered 2014-04-13 (×2): 2 via ORAL
  Filled 2014-04-13 (×2): qty 2

## 2014-04-13 NOTE — Progress Notes (Signed)
Brief X-Cover Note  MD Mark [name] called because Mr. Mark [name] had CP throughout the day. Vitals reviewed; BP 132/82  Pulse 67  Temp(Src) 97.9 F (36.6 C) (Oral)  Resp 18  Ht 5\' 7"  (1.702 m)  Wt 70.308 kg (155 lb)  BMI 24.27 kg/m2  SpO2 96% ECG reviewed and stable. On discussion with Mr. Mark [name] he actually feels much better pain at worst 2-3/10 and subsiding. On review of chart, mr. Mark [name] had a focal intimal dissection but a relook at end of LHC that revealed patent stent/vessel. Will continue to watch closely, in stepdown, on telemetry. If he has recurrence of symptoms will recheck ECG. I discussed plan with Mr. Mark [name].   Mark MustJacob Kamora Vossler, MD

## 2014-04-13 NOTE — Care Management Note (Addendum)
  Page 1 of 1   04/13/2014     5:35:02 PM CARE MANAGEMENT NOTE 04/13/2014  Patient:  Mark [name],Mark [name]   Account Number:  [id number]  Date Initiated:  04/13/2014  Documentation initiated by:  Junius CreamerWELL,DEBBIE  Subjective/Objective Assessment:   adm post card cath     Action/Plan:   lives w wife, pcp dr Nicholos Johnsramachandran   Anticipated DC Date:  04/14/2014   Anticipated DC Plan:  HOME/SELF CARE      DC Planning Services  CM consult  Medication Assistance      Choice offered to / List presented to:             Status of service:  In process, will continue to follow Medicare Important Message given?   (If response is "NO", the following Medicare IM given date fields will be blank) Date Medicare IM given:   Date Additional Medicare IM given:    Discharge Disposition:  HOME/SELF CARE  Per UR Regulation:  Reviewed for med. necessity/level of care/duration of stay  If discussed at Long Length of Stay Meetings, dates discussed:    Comments:  04/13/2014 per hand off report: "---04/13/2014 1121 by [facility]---Cathed/ intervention/ OIB documented" Carnetta Losada RN, BSN, MSHL, CCM  Nurse - Case Manager, (Unit [phone])  [phone]  04/13/2014  4/29 1143 debbie dowell rn,bsn pt has 30day free and copay assist card for brilinta.

## 2014-04-13 NOTE — H&P (View-Only) (Signed)
 Mark [name] Date of Birth: 12/18/1960 Medical Record #[id number]  History of Present Illness: Mark [name] is seen back today for a pre cath visit. Seen for Dr. Jordan. He has HLD, CAD, HTN, SVG with remote ablation in 2008 per Dr. Taylor and tobacco abuse. Had STEMI in May of 2012 and was treated with BMS to the 3rd OM with residual disease noted on cath - follow up Myoview in June of 2012 was satisfactory.   Seen earlier this month with atypical chest pain. Very worried about his known residual disease. We updated his Myoview. Dr. Jordan has reviewed - has recommended cath.   Comes in today. Here with his wife today. Still worried. Still with an achy feeling in his chest - more with rest than with exertion but has had with exertion. Still does not feel like his prior chest pain syndrome but "I know something is not right in there". No NTG use.   Current Outpatient Prescriptions  Medication Sig Dispense Refill  . aspirin 81 MG tablet Take 81 mg by mouth daily.        . atorvastatin (LIPITOR) 80 MG tablet Take 0.5 tablets (40 mg total) by mouth daily.  45 tablet  6  . metoprolol tartrate (LOPRESSOR) 25 MG tablet Take 1 tablet (25 mg total) by mouth 2 (two) times daily.  60 tablet  6  . NITROSTAT 0.4 MG SL tablet TAKE 1 TABLET BY MOUTH EVERY 5 MINS AN FOR UP TO 3 DOSES  25 tablet  6  . omeprazole (PRILOSEC) 40 MG capsule Take 40 mg by mouth 2 (two) times daily.        No current facility-administered medications for this visit.    Allergies  Allergen Reactions  . Penicillins     Past Medical History  Diagnosis Date  . CAD (coronary artery disease)   . Hyperlipidemia   . Hypertension   . Tobacco abuse   . SVT (supraventricular tachycardia)   . STEMI (ST elevation myocardial infarction) 04/23/11  . S/P coronary artery stent placement 04/23/11    BMS to the 3rd OM; has residual disease    Past Surgical History  Procedure Laterality Date  . Svt ablation   2008    Dr. Taylor  . Coronary  stent placement  04/23/11    BMS to the 3rd OM    History  Smoking status  . Former Smoker  . Quit date: 04/23/2011  Smokeless tobacco  . Former User  . Quit date: 04/23/2011    History  Alcohol Use No    Family History  Problem Relation Age of Onset  . Hypertension Mother     Review of Systems: The review of systems is per the HPI.  All other systems were reviewed and are negative.  Physical Exam: BP 124/78  Pulse 80  Wt 155 lb (70.308 kg) Patient is very pleasant and in no acute distress. Skin is warm and dry. Color is normal.  HEENT is unremarkable. Normocephalic/atraumatic. PERRL. Sclera are nonicteric. Neck is supple. No masses. No JVD. Lungs are clear. Cardiac exam shows a regular rate and rhythm. Abdomen is soft. Extremities are without edema. Gait and ROM are intact. No gross neurologic deficits noted.  LABORATORY DATA: Lab Results  Component Value Date   WBC 10.9* 04/24/2011   HGB 15.3 04/24/2011   HCT 45.3 04/24/2011   PLT 246 04/24/2011   GLUCOSE 113* 09/09/2013   CHOL 134 09/09/2013   TRIG 75.0 09/09/2013   HDL 36.40* 09/09/2013     LDLCALC 83 09/09/2013   ALT 29 09/09/2013   AST 21 09/09/2013   NA 142 09/09/2013   K 4.5 09/09/2013   CL 109 09/09/2013   CREATININE 0.9 09/09/2013   BUN 20 09/09/2013   CO2 28 09/09/2013   TSH 1.518 04/23/2011   INR 0.94 04/23/2011   HGBA1C  Value: 6.0 (NOTE)                                                                       According to the ADA Clinical Practice Recommendations for 2011, when HbA1c is used as a screening test:   >=6.5%   Diagnostic of Diabetes Mellitus           (if abnormal result  is confirmed)  5.7-6.4%   Increased risk of developing Diabetes Mellitus  References:Diagnosis and Classification of Diabetes Mellitus,Diabetes Care,2011,34(Suppl 1):S62-S69 and Standards of Medical Care in         Diabetes - 2011,Diabetes Care,2011,34  (Suppl 1):S11-S61.* 04/23/2011   Myoview Impression  Exercise Capacity: Good exercise capacity.    BP Response: Normal blood pressure response.  Clinical Symptoms: Fatigue.  ECG Impression: Significant ST abnormalities consistent with ischemia.  Comparison with Prior Nuclear Study: No images to compare   Overall Impression: Intermediate risk stress nuclear study with a large scar in the RCA?LCX territory with moderate periinfarct ischemia (SDS 7)..  LV Ejection Fraction: > 70%. LV Wall Motion: Hypokinesis of the basal and mid inferior and inferolateral walls.  Katarina H Nelson  04/05/2014   ANGIOGRAPHIC DATA - MAY 2012:  The left coronary arises and distributes normally.  The left main coronary artery is normal.  Left anterior descending artery is calcified in the proximal vessel.  There is diffuse 70% disease in the proximal LAD extending into the  first diagonal. There is some ectasia at the bifurcation of the LAD and  diagonal.  Left circumflex coronary artery is a large vessel which gives rise to 3  marginal vessels before terminating in the AV groove. There is 40%  disease in the proximal circumflex. The first obtuse marginal vessel  has 40% disease proximally. The third obtuse marginal vessel was  occluded proximally.  The right coronary artery arises and distributes normally and has  diffuse 30-40% disease in the midvessel.  At this point, we proceeded with intervention of the third obtuse  marginal vessel which was the culprit lesion. After anticoagulation,  this lesion was crossed with moderate difficulty with a wire. We  predilated the lesion with reperfusion using a 2.5 x 15-mm Apex balloon  up to 4 atmospheres. We then stented the proximal obtuse marginal  vessel using a 2.75 x 20 mm VeriFLEX which was postdilated with a 3.0 x  15-mm Sour John Trek balloon up to 14 atmospheres. This yielded excellent  angiographic result with 0% residual stenosis and TIMI grade 3 flow.  At this point, we performed left ventricular angiography in the RAO  view. This demonstrates normal left  ventricular size with mid minimal  inferior hypokinesia and normal ejection fraction of 60%.  FINAL INTERPRETATION:  1. 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  2. Successful stenting of the third obtuse marginal vessel using a  bare-metal stent.  3. Normal overall left ventricular function.    PLAN: We will continue therapy with aspirin and Effient. We will focus  on risk factor modification with lipid lowering therapy, tobacco  cessation and blood pressure control. We will need to reassess his LAD  with functional study once he is recovered from his infarct to see if he  needs any additional revascularization.  ______________________________  Peter M. Jordan, M.D.    Assessment / Plan: 1. CAD - with past PCI - known residual disease - now with atypical chest pain - s/p Myoview - reviewed by Dr. Jordan - will proceed on with cardiac catheterization - procedure is reviewed and he is willing to proceed. He is scheduled for Wednesday, April 29th.   2. HTN - BP is great. No change on current therapy.   3. HLD - on statin therapy. Has just had recent labs with his physical per primary care.   4. Tobacco abuse - resolved.   5. GERD - now on higher doses of PPI therapy.   Patient is agreeable to this plan and will call if any problems develop in the interim.   Cohan Stipes C. Delsin Copen, RN, ANP-C  [facility]  [address]  [place], [place] [address]  [phone]     

## 2014-04-13 NOTE — Progress Notes (Signed)
Utilization Review Completed.Gavin Poundeborah T Dowell4/29/2015

## 2014-04-13 NOTE — CV Procedure (Signed)
Cardiac Catheterization Procedure Note  Name: Mark [name] MRN: [id number] DOB: 11-Oct-1961  Procedure: Left Heart Cath, Selective Coronary Angiography, LV angiography, PTCA and stenting of the proximal LAD.   Indication: 53 yo WM with history of CAD s/p stenting of the OM in 2012 presents with symptoms of increasing exertional chest pain. Myoview study was intermediate risk.   Procedural Details:  The right wrist was prepped, draped, and anesthetized with 1% lidocaine. Using the modified Seldinger technique, a 6 French slender sheath was introduced into the right radial artery. 3 mg of verapamil was administered through the sheath, weight-based unfractionated heparin was administered intravenously. Standard Judkins catheters were used for selective coronary angiography and left ventriculography. Catheter exchanges were performed over an exchange length guidewire.  PROCEDURAL FINDINGS Hemodynamics: AO 110/63 mean 86 mm Hg LV 124/19 mm Hg   Coronary angiography: Coronary dominance: right  Left mainstem: Normal.  Left anterior descending (LAD): The LAD is heavily calcified. There is a long segment of disease in the proximal vessel. There is a focal 90% stenosis in the first portion of the LAD with diffuse 70% disease within this segment. There is complex 40% disease at the takeoff of the first diagonal.   Left circumflex (LCx): Mild irregularities less than 20%. The stent in the large OM 2 is widely patent.  Right coronary artery (RCA): Dominant vessel. There is segmental 40% disease in the mid vessel.  Left ventriculography: Left ventricular systolic function is normal, LVEF is estimated at 55-65%, there is no significant mitral regurgitation   PCI Note:  Following the diagnostic procedure, the decision was made to proceed with PCI of the LAD. This had progressed significantly since his prior study.  Weight-based heparin was given for anticoagulation. Brilinta 180 mg was given  orally. Once a therapeutic ACT was achieved, a 6 [place] CLS 3.5 guide catheter was inserted.  A prowater coronary guidewire was used to cross the lesion.  The lesion was predilated with a 2.5 mm balloon up to 14 atm. I was unable to cross the distal lesion with a stent or an Angiosculpt balloon. I dilated again with a 3.0 mm noncompliant balloon to 14 atm.  This resulting is a focal intimal dissection in the proximal LAD. Although I wasn't able to pass the stent quite as far as I would have liked I was able to cover the lesion with a stent. The lesion was then stented with a 3.0 x 23 mm Alpine stent.  The stent was postdilated with a 3.25 mm noncompliant balloon.  Following PCI, there was 0% residual stenosis and TIMI-3 flow. Final angiography confirmed an excellent result.  The patient complained of persistent chest pain. I switched out for a diagnostic catheter and further angiograms were performed. IC Ntg was given. There still appeared to be a good angiographic results with no thrombus or evidence of dissection proximal or distal to the stent. Ecg showed no ST changes. I felt that his residual pain was related to plaque shift into the septal perforator branch. There were no immediate procedural complications. A TR band was used for radial hemostasis. The patient was transferred to the post catheterization recovery area for further monitoring.  PCI Data: Vessel - LAD/Segment - proximal Percent Stenosis (pre)  90% TIMI-flow 3 Stent 3.0 x 23 mm Alpine stent Percent Stenosis (post) 0% TIMI-flow (post) 3  Final Conclusions:   1. Single vessel obstructive CAD with progressive disease in the proximal LAD. Continued patency of the stent in the OM.  2. Normal LV function. 3. Successful stenting of the proximal LAD with a DES.   Recommendations:  DAPT for one year. Will monitor overnight in the step down unit. If no complications may be able to DC in am.  Ander Slade [facility] 04/13/2014, 10:22 AM

## 2014-04-13 NOTE — Interval H&P Note (Signed)
History and Physical Interval Note:  04/13/2014 8:33 AM  Mark [name]  has presented today for surgery, with the diagnosis of cad  The various methods of treatment have been discussed with the patient and family. After consideration of risks, benefits and other options for treatment, the patient has consented to  Procedure(s): LEFT HEART CATHETERIZATION WITH CORONARY ANGIOGRAM (N/A) as a surgical intervention .  The patient's history has been reviewed, patient examined, no change in status, stable for surgery.  I have reviewed the patient's chart and labs.  Questions were answered to the patient's satisfaction.   Cath Lab Visit (complete for each Cath Lab visit)  Clinical Evaluation Leading to the Procedure:   ACS: no  Non-ACS:    Anginal Classification: CCS III  Anti-ischemic medical therapy: Minimal Therapy (1 class of medications)  Non-Invasive Test Results: Intermediate-risk stress test findings: cardiac mortality 1-3%/year  Prior CABG: No previous CABG        Mark [name] M Mark PhiladeLPhia HospitalJordanMD,Mark [name] 04/13/2014 8:34 AM

## 2014-04-14 DIAGNOSIS — I209 Angina pectoris, unspecified: Secondary | ICD-10-CM

## 2014-04-14 LAB — CBC
HCT: 43.7 % (ref 39.0–52.0)
Hemoglobin: 14.6 g/dL (ref 13.0–17.0)
MCH: 29.8 pg (ref 26.0–34.0)
MCHC: 33.4 g/dL (ref 30.0–36.0)
MCV: 89.2 fL (ref 78.0–100.0)
Platelets: 206 10*3/uL (ref 150–400)
RBC: 4.9 MIL/uL (ref 4.22–5.81)
RDW: 13.2 % (ref 11.5–15.5)
WBC: 11.5 10*3/uL — ABNORMAL HIGH (ref 4.0–10.5)

## 2014-04-14 LAB — BASIC METABOLIC PANEL
BUN: 12 mg/dL (ref 6–23)
CALCIUM: 8.9 mg/dL (ref 8.4–10.5)
CO2: 25 mEq/L (ref 19–32)
Chloride: 104 mEq/L (ref 96–112)
Creatinine, Ser: 0.63 mg/dL (ref 0.50–1.35)
Glucose, Bld: 111 mg/dL — ABNORMAL HIGH (ref 70–99)
Potassium: 4 mEq/L (ref 3.7–5.3)
SODIUM: 141 meq/L (ref 137–147)

## 2014-04-14 LAB — MRSA PCR SCREENING: MRSA by PCR: NEGATIVE

## 2014-04-14 MED ORDER — TICAGRELOR 90 MG PO TABS: 90.0000 mg | ORAL_TABLET | Freq: Two times a day (BID) | ORAL | Status: DC

## 2014-04-14 NOTE — Progress Notes (Signed)
    Subjective:  Feeling ok. Minimal discomfort in his left upper chest unchanged. He had no symptoms with walking. No dyspnea or other complaints.  Objective:  Vital Signs in the last 24 hours: Temp:  [97.1 F (36.2 C)-98.6 F (37 C)] 98.2 F (36.8 C) (04/30 0744) Pulse Rate:  [64-76] 67 (04/29 1900) Resp:  [13-21] 18 (04/30 0744) BP: (110-143)/(61-124) 111/71 mmHg (04/30 0744) SpO2:  [94 %-99 %] 96 % (04/30 0744)  Intake/Output from previous day: 04/29 0701 - 04/30 0700 In: 22.3 [I.V.:22.3] Out: 550 [Urine:550]  Physical Exam: Pt is alert and oriented, NAD HEENT: normal Neck: JVP - normal Lungs: CTA bilaterally CV: RRR without murmur or gallop Abd: soft, NT, Positive BS, no hepatomegaly Ext: no C/C/E, distal pulses intact and equal, right radial site clear Skin: warm/dry no rash   Lab Results:  Recent Labs  04/11/14 1412 04/14/14 0411  WBC 6.5 11.5*  HGB 14.4 14.6  PLT 240.0 206    Recent Labs  04/11/14 1412 04/14/14 0411  NA 139 141  K 3.7 4.0  CL 106 104  CO2 27 25  GLUCOSE 120* 111*  BUN 18 12  CREATININE 0.7 0.63   No results found for this basename: TROPONINI, CK, MB,  in the last 72 hours  Cardiac Studies: Cath report reviewed  Tele: Sinus rhythm, few PVC's and one 4 beat run of VT  Assessment/Plan:  CAD with CCS Class 3 angina, now s/p PCI of the proximal LAD. Mild discomfort post-PCI, but clinically stable and asymptomatic with walking this am. EKG without ischemic changes. OK for discharge this morning. ASA 81 mg and brilinta x 12 months. Follow-up Dr SwazilandJordan or Norma FredricksonLori Gerhardt 2 weeks. Otherwise continue home meds (beta-blocker/statin).  Tonny BollmanMichael Aiyannah Fayad, M.D. 04/14/2014, 10:39 AM

## 2014-04-14 NOTE — Progress Notes (Signed)
CARDIAC REHAB PHASE I   PRE:  Rate/Rhythm: 85 SR  BP:  Supine: 112/71  Sitting:   Standing:    SaO2:   MODE:  Ambulation: 700 ft   POST:  Rate/Rhythm: 89-91 SR  BP:  Supine:   Sitting: 118/71  Standing:    SaO2:  0905-0948 Pt walked 700 ft with steady gait. Tolerated well. Chest discomfort at 2-3/10 prior to walk and at zero by end of walk. Stated walking made him feel better. Education completed with pt and wife with understanding voiced. Has brilinta packet and stent card. Discussed CRP 2 and pt declined. Wants to exercise on his own.    Luetta Nuttingharlene Anely Spiewak, RN BSN  04/14/2014 9:44 AM

## 2014-04-14 NOTE — Progress Notes (Signed)
Pt discharged at 1300 with all belongings. Transported to car per wheelchair

## 2014-04-14 NOTE — Discharge Summary (Signed)
CARDIOLOGY DISCHARGE SUMMARY   Patient ID: Mark [name] MRN: [id number] DOB/AGE: [birth year] 53 y.o.  Admit date: 04/13/2014 Discharge date: 04/14/2014  PCP: Georgianne FickAMACHANDRAN,AJITH, MD Primary Cardiologist: Dr. SwazilandJordan  Primary Discharge Diagnosis:   Angina pectoris - 3.0 x 23 mm Alpine DES to the proximal LAD  Secondary Discharge Diagnosis:  Hypertension Hyperlipidemia  Procedures: Left Heart Cath, Selective Coronary Angiography, LV angiography, PTCA and stenting of the proximal LAD  Hospital Course: Mark [name] is a 53 y.o. male with a history of CAD. He was evaluated for chest pain as an outpatient and had a Myoview which was intermediate risk. Cardiac catheterization was recommended. He was evaluated prior to cath and it was noted that he was having episodes of chest pain at rest and with exertion. Cardiac catheterization was scheduled and he came to the hospital for the procedure on 04/29.  Cardiac catheterization results are below. He had a stent to the proximal LAD, reducing the stenosis to 0. He tolerated the procedure well and the sheath was removed without difficulty.  He was monitored and step down overnight. His blood pressure and heart rate were well controlled on current medical therapy. His oxygen saturation remained good on room air. He is continued on a statin.  On 04/30, he was seen by Dr. Excell Seltzerooper and by cardiac rehabilitation. He was educated on set guidelines, heart-healthy lifestyle modifications and exercise guidelines. He was started on Brilinta and given a packet with a 30-day card and a stent card. He declined outpatient cardiac rehabilitation and is encouraged to exercise on his own.  Dr. Excell Seltzerooper reviewed all data. Mark [name] was ambulating without chest pain or shortness of breath and considered stable for discharge, to followup as an outpatient.  Labs:   Lab Results  Component Value Date   WBC 11.5* 04/14/2014   HGB 14.6 04/14/2014   HCT 43.7 04/14/2014   MCV 89.2 04/14/2014   PLT 206 04/14/2014     Recent Labs Lab 04/14/14 0411  NA 141  K 4.0  CL 104  CO2 25  BUN 12  CREATININE 0.63  CALCIUM 8.9  GLUCOSE 111*    Recent Labs  04/11/14 1412  INR 1.0      Radiology: Dg Chest 2 View 04/11/2014   CLINICAL DATA:  Chest pain; preoperative evaluation  EXAM: CHEST  2 VIEW  COMPARISON:  October 03, 2004  FINDINGS: There is minimal scarring in the left base. Lungs elsewhere are clear. Heart size and pulmonary vascularity are normal. No adenopathy. No bone lesions.  IMPRESSION: Slight scarring left base.  No edema or consolidation.   Electronically Signed   By: Bretta BangWilliam  Woodruff M.D.   On: 04/11/2014 16:09    Cardiac Cath: 04/13/2014 Left mainstem: Normal.  Left anterior descending (LAD): The LAD is heavily calcified. There is a long segment of disease in the proximal vessel. There is a focal 90% stenosis in the first portion of the LAD with diffuse 70% disease within this segment. There is complex 40% disease at the takeoff of the first diagonal.  Left circumflex (LCx): Mild irregularities less than 20%. The stent in the large OM 2 is widely patent.  Right coronary artery (RCA): Dominant vessel. There is segmental 40% disease in the mid vessel.  Left ventriculography: Left ventricular systolic function is normal, LVEF is estimated at 55-65%, there is no significant mitral regurgitation  PCI Data:  Vessel - LAD/Segment - proximal  Percent Stenosis (pre) 90%  TIMI-flow 3  Stent 3.0 x  23 mm Alpine stent  Percent Stenosis (post) 0%  TIMI-flow (post) 3  Final Conclusions:  1. Single vessel obstructive CAD with progressive disease in the proximal LAD. Continued patency of the stent in the OM.  2. Normal LV function.  3. Successful stenting of the proximal LAD with a DES.  Recommendations:  DAPT for one year. Will monitor overnight in the step down unit. If no complications may be able to DC in am.    EKG: 04/14/2014 Vent. rate 66 BPM PR  interval 122 ms QRS duration 96 ms QT/QTc 374/392 ms P-R-T axes 52 39 37  FOLLOW UP PLANS AND APPOINTMENTS Allergies  Allergen Reactions  . Penicillins     Passed out and got weak     Medication List         aspirin 81 MG tablet  Take 81 mg by mouth daily.     atorvastatin 80 MG tablet  Commonly known as:  LIPITOR  Take 0.5 tablets (40 mg total) by mouth daily.     metoprolol tartrate 25 MG tablet  Commonly known as:  LOPRESSOR  Take 1 tablet (25 mg total) by mouth 2 (two) times daily.     NITROSTAT 0.4 MG SL tablet  Generic drug:  nitroGLYCERIN  TAKE 1 TABLET BY MOUTH EVERY 5 MINS AN FOR UP TO 3 DOSES     omeprazole 40 MG capsule  Commonly known as:  PRILOSEC  Take 40 mg by mouth 2 (two) times daily.     ticagrelor 90 MG Tabs tablet  Commonly known as:  BRILINTA  Take 1 tablet (90 mg total) by mouth 2 (two) times daily.        Discharge Orders   Future Appointments Provider Department Dept Phone   05/02/2014 8:00 AM Peter M SwazilandJordan, MD [facility] [address] Office [phone]   Future Orders Complete By Expires   Diet - low sodium heart healthy  As directed    Increase activity slowly  As directed      Follow-up Information   Follow up with Peter Swazil[place]Jordan, MD On 05/02/2014. (at 8:00 am)    Specialty:  Cardiology   Contact information:   [address] [place] [address] [phone]       BRING ALL MEDICATIONS WITH YOU TO FOLLOW UP APPOINTMENTS  Time spent with patient to include physician time: 38 min Signed: Darrol Jumphonda G Jullien Granquist, PA-C 04/14/2014, 12:04 PM Co-Sign MD

## 2014-04-19 ENCOUNTER — Telehealth: Payer: Self-pay | Admitting: Cardiology

## 2014-04-19 NOTE — Telephone Encounter (Signed)
Pt's wife called  Stating that  pt was sent home from the hospital last Thursday with Brillinta 90 mg. Wife states that since pt has been taking this medication pt has been nauseated and have some SOB sometimes. Pt's wife is aware that we will call her back after talking to Md.

## 2014-04-19 NOTE — Telephone Encounter (Signed)
Patient's wife called in and stated that patient is having SOB since his stint last week and also beginning the [place] medication. [facility].

## 2014-04-19 NOTE — Telephone Encounter (Signed)
Returned call to patient's wife.Dr.Jordan out of office this week.Spoke to Dr.Nahser he advised to stop Brillinta.Start Effient 10 mg daily.Samples left at 3rd floor front desk.Advised to keep appointment with Dr.Jordan 05/02/14 at 8:00 am.Advised to call back if needed.

## 2014-05-02 ENCOUNTER — Encounter: Payer: Self-pay | Admitting: Cardiology

## 2014-05-02 ENCOUNTER — Ambulatory Visit (INDEPENDENT_AMBULATORY_CARE_PROVIDER_SITE_OTHER): Payer: Federal, State, Local not specified - PPO | Admitting: Cardiology

## 2014-05-02 VITALS — BP 126/81 | HR 63 | Ht 67.0 in | Wt 156.8 lb

## 2014-05-02 DIAGNOSIS — Z720 Tobacco use: Secondary | ICD-10-CM

## 2014-05-02 DIAGNOSIS — I2510 Atherosclerotic heart disease of native coronary artery without angina pectoris: Secondary | ICD-10-CM

## 2014-05-02 DIAGNOSIS — F17200 Nicotine dependence, unspecified, uncomplicated: Secondary | ICD-10-CM

## 2014-05-02 DIAGNOSIS — E785 Hyperlipidemia, unspecified: Secondary | ICD-10-CM

## 2014-05-02 NOTE — Patient Instructions (Signed)
Continue your current therapy  I will see you in 2 months with fasting lab work

## 2014-05-02 NOTE — Progress Notes (Signed)
Mark [name] Date of Birth: 03-Apr-1961 Medical Record #[id number]  History of Present Illness: Mark [name] is seen back today for a follow up visit post PCI. He has HLD, CAD, HTN, SVG with remote ablation in 2008 per Dr. Lovena Le and tobacco abuse. Had STEMI in May of 2012 and was treated with BMS to the 3rd OM with residual disease noted on cath - follow up Myoview in June of 2012 was satisfactory. In April he presented with increasing chest pain. Myoview was intermediate risk. He underwent cardiac cath which showed severe stenosis in the proximal LAD. He had successful DES of the LAD. He did have considerable chest pain during and after the procedure but this has resolved. He was intolerant of Brilinta due to dyspnea and was switched to Effient. Now he has resumed work and exercise and feels well. He is not smoking.  Current Outpatient Prescriptions  Medication Sig Dispense Refill  . aspirin 81 MG tablet Take 81 mg by mouth daily.       Marland Kitchen atorvastatin (LIPITOR) 80 MG tablet Take 0.5 tablets (40 mg total) by mouth daily.  45 tablet  6  . metoprolol tartrate (LOPRESSOR) 25 MG tablet Take 1 tablet (25 mg total) by mouth 2 (two) times daily.  60 tablet  6  . NITROSTAT 0.4 MG SL tablet TAKE 1 TABLET BY MOUTH EVERY 5 MINS AN FOR UP TO 3 DOSES  25 tablet  6  . omeprazole (PRILOSEC) 40 MG capsule Take 40 mg by mouth 2 (two) times daily.       . prasugrel (EFFIENT) 10 MG TABS tablet Take 1 tablet (10 mg total) by mouth daily.  30 tablet  6   No current facility-administered medications for this visit.    Allergies  Allergen Reactions  . Penicillins     Passed out and got weak    Past Medical History  Diagnosis Date  . CAD (coronary artery disease)   . Hyperlipidemia   . Hypertension   . Tobacco abuse   . SVT (supraventricular tachycardia)   . STEMI (ST elevation myocardial infarction) 04/23/11  . S/P coronary artery stent placement 04/23/11    BMS to the 3rd OM; has residual disease    Past  Surgical History  Procedure Laterality Date  . Svt ablation   2008    Dr. Lovena Le  . Coronary stent placement  04/23/11    BMS to the 3rd OM    History  Smoking status  . Former Smoker  . Quit date: 04/23/2011  Smokeless tobacco  . Former Systems developer  . Quit date: 04/23/2011    History  Alcohol Use No    Family History  Problem Relation Age of Onset  . Hypertension Mother     Review of Systems: The review of systems is per the HPI.  All other systems were reviewed and are negative.  Physical Exam: BP 126/81  Pulse 63  Ht _0  (1.702 m)  Wt 156 lb 12.8 oz (71.124 kg)  BMI 24.55 kg/m2 Patient is very pleasant and in no acute distress. Skin is warm and dry. Color is normal.  HEENT is unremarkable. Normocephalic/atraumatic. PERRL. Sclera are nonicteric. Neck is supple. No masses. No JVD. Lungs are clear. Cardiac exam shows a regular rate and rhythm. Abdomen is soft. Extremities are without edema. Radial site is without hematoma. Gait and ROM are intact. No gross neurologic deficits noted.  LABORATORY DATA: Lab Results  Component Value Date   WBC 11.5* 04/14/2014  HGB 14.6 04/14/2014   HCT 43.7 04/14/2014   PLT 206 04/14/2014   GLUCOSE 111* 04/14/2014   CHOL 134 09/09/2013   TRIG 75.0 09/09/2013   HDL 36.40* 09/09/2013   LDLCALC 83 09/09/2013   ALT 29 09/09/2013   AST 21 09/09/2013   NA 141 04/14/2014   K 4.0 04/14/2014   CL 104 04/14/2014   CREATININE 0.63 04/14/2014   BUN 12 04/14/2014   CO2 25 04/14/2014   TSH 1.518 04/23/2011   INR 1.0 04/11/2014   HGBA1C  Value: 6.0 (NOTE)                                                                       According to the ADA Clinical Practice Recommendations for 2011, when HbA1c is used as a screening test:   >=6.5%   Diagnostic of Diabetes Mellitus           (if abnormal result  is confirmed)  5.7-6.4%   Increased risk of developing Diabetes Mellitus  References:Diagnosis and Classification of Diabetes Mellitus,Diabetes UEKC,0034,91(PHXTA  1):S62-S69 and Standards of Medical Care in         Diabetes - 2011,Diabetes Care,2011,34  (Suppl 1):S11-S61.* 04/23/2011   Myoview Impression  Exercise Capacity: Good exercise capacity.  BP Response: Normal blood pressure response.  Clinical Symptoms: Fatigue.  ECG Impression: Significant ST abnormalities consistent with ischemia.  Comparison with Prior Nuclear Study: No images to compare   Overall Impression: Intermediate risk stress nuclear study with a large scar in the RCA?LCX territory with moderate periinfarct ischemia (SDS 7)..  LV Ejection Fraction: > 70%. LV Wall Motion: Hypokinesis of the basal and mid inferior and inferolateral walls.  Mark [name]  04/05/2014   ANGIOGRAPHIC DATA - MAY 2012:  The left coronary arises and distributes normally.  The left main coronary artery is normal.  Left anterior descending artery is calcified in the proximal vessel.  There is diffuse 70% disease in the proximal LAD extending into the  first diagonal. There is some ectasia at the bifurcation of the LAD and  diagonal.  Left circumflex coronary artery is a large vessel which gives rise to 3  marginal vessels before terminating in the AV groove. There is 40%  disease in the proximal circumflex. The first obtuse marginal vessel  has 40% disease proximally. The third obtuse marginal vessel was  occluded proximally.  The right coronary artery arises and distributes normally and has  diffuse 30-40% disease in the midvessel.  At this point, we proceeded with intervention of the third obtuse  marginal vessel which was the culprit lesion. After anticoagulation,  this lesion was crossed with moderate difficulty with a wire. We  predilated the lesion with reperfusion using a 2.5 x 15-mm Apex balloon  up to 4 atmospheres. We then stented the proximal obtuse marginal  vessel using a 2.75 x 20 mm VeriFLEX which was postdilated with a 3.0 x  15-mm Saxman Trek balloon up to 14 atmospheres. This yielded  excellent  angiographic result with 0% residual stenosis and TIMI grade 3 flow.  At this point, we performed left ventricular angiography in the RAO  view. This demonstrates normal left ventricular size with mid minimal  inferior hypokinesia and normal ejection fraction of  60%.  FINAL INTERPRETATION:  1. 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  2. Successful stenting of the third obtuse marginal vessel using a  bare-metal stent.  3. Normal overall left ventricular function.  PLAN: We will continue therapy with aspirin and Effient. We will focus  on risk factor modification with lipid lowering therapy, tobacco  cessation and blood pressure control. We will need to reassess his LAD  with functional study once he is recovered from his infarct to see if he  needs any additional revascularization.  ______________________________  Emmalynn Pinkham M. [place], M.D.   Cardiac Catheterization Procedure Note  Name: Jeramey Lanuza  MRN: [id number]  DOB: 1961-04-21  Procedure: Left Heart Cath, Selective Coronary Angiography, LV angiography, PTCA and stenting of the proximal LAD.  Indication: 53 yo WM with history of CAD s/p stenting of the OM in 2012 presents with symptoms of increasing exertional chest pain. Myoview study was intermediate risk.  Procedural Details: The right wrist was prepped, draped, and anesthetized with 1% lidocaine. Using the modified Seldinger technique, a 6 French slender sheath was introduced into the right radial artery. 3 mg of verapamil was administered through the sheath, weight-based unfractionated heparin was administered intravenously. Standard Judkins catheters were used for selective coronary angiography and left ventriculography. Catheter exchanges were performed over an exchange length guidewire.  PROCEDURAL FINDINGS  Hemodynamics:  AO 110/63 mean 86 mm Hg  LV 124/19 mm Hg  Coronary angiography:  Coronary dominance: right  Left mainstem: Normal.  Left  anterior descending (LAD): The LAD is heavily calcified. There is a long segment of disease in the proximal vessel. There is a focal 90% stenosis in the first portion of the LAD with diffuse 70% disease within this segment. There is complex 40% disease at the takeoff of the first diagonal.  Left circumflex (LCx): Mild irregularities less than 20%. The stent in the large OM 2 is widely patent.  Right coronary artery (RCA): Dominant vessel. There is segmental 40% disease in the mid vessel.  Left ventriculography: Left ventricular systolic function is normal, LVEF is estimated at 55-65%, there is no significant mitral regurgitation  PCI Note: Following the diagnostic procedure, the decision was made to proceed with PCI of the LAD. This had progressed significantly since his prior study. Weight-based heparin was given for anticoagulation. Brilinta 180 mg was given orally. Once a therapeutic ACT was achieved, a 6 [place] CLS 3.5 guide catheter was inserted. A prowater coronary guidewire was used to cross the lesion. The lesion was predilated with a 2.5 mm balloon up to 14 atm. I was unable to cross the distal lesion with a stent or an Angiosculpt balloon. I dilated again with a 3.0 mm noncompliant balloon to 14 atm. This resulting is a focal intimal dissection in the proximal LAD. Although I wasn't able to pass the stent quite as far as I would have liked I was able to cover the lesion with a stent. The lesion was then stented with a 3.0 x 23 mm Alpine stent. The stent was postdilated with a 3.25 mm noncompliant balloon. Following PCI, there was 0% residual stenosis and TIMI-3 flow. Final angiography confirmed an excellent result. The patient complained of persistent chest pain. I switched out for a diagnostic catheter and further angiograms were performed. IC Ntg was given. There still appeared to be a good angiographic results with no thrombus or evidence of dissection proximal or distal to the stent. Ecg showed no  ST changes. I felt that his residual pain was  related to plaque shift into the septal perforator branch. There were no immediate procedural complications. A TR band was used for radial hemostasis. The patient was transferred to the post catheterization recovery area for further monitoring.  PCI Data:  Vessel - LAD/Segment - proximal  Percent Stenosis (pre) 90%  TIMI-flow 3  Stent 3.0 x 23 mm Alpine stent  Percent Stenosis (post) 0%  TIMI-flow (post) 3  Final Conclusions:  1. Single vessel obstructive CAD with progressive disease in the proximal LAD. Continued patency of the stent in the OM.  2. Normal LV function.  3. Successful stenting of the proximal LAD with a DES.  Recommendations:  DAPT for one year. Will monitor overnight in the step down unit. If no complications may be able to DC in am.  Ander Slade [facility]  04/13/2014, 10:22 AM     Assessment / Plan: 1. CAD - with past PCI of the OM- still patent. New high grade disease of the proximal LAD. S/p DES. Continue dual antiplatelet therapy for at least one year.  2. HTN - BP is great. No change on current therapy.   3. HLD - on high dose statin therapy. Stressed importance of dietary modification.  4. Tobacco abuse - resolved.   5. GERD - now on higher doses of PPI therapy.   I will follow up in 2-3 months with fasting lab work.

## 2014-06-08 ENCOUNTER — Telehealth: Payer: Self-pay | Admitting: *Deleted

## 2014-06-08 NOTE — Telephone Encounter (Signed)
Patient requests effient samples. I will place at the front desk for pick up. 

## 2014-06-24 ENCOUNTER — Encounter: Payer: Self-pay | Admitting: Cardiology

## 2014-06-24 ENCOUNTER — Other Ambulatory Visit: Payer: Self-pay

## 2014-06-24 ENCOUNTER — Ambulatory Visit (INDEPENDENT_AMBULATORY_CARE_PROVIDER_SITE_OTHER): Payer: Federal, State, Local not specified - PPO | Admitting: Cardiology

## 2014-06-24 VITALS — BP 125/86 | HR 86 | Ht 67.0 in | Wt 155.4 lb

## 2014-06-24 DIAGNOSIS — E785 Hyperlipidemia, unspecified: Secondary | ICD-10-CM

## 2014-06-24 DIAGNOSIS — I10 Essential (primary) hypertension: Secondary | ICD-10-CM

## 2014-06-24 DIAGNOSIS — I2510 Atherosclerotic heart disease of native coronary artery without angina pectoris: Secondary | ICD-10-CM

## 2014-06-24 LAB — HEPATIC FUNCTION PANEL
ALK PHOS: 58 U/L (ref 39–117)
ALT: 19 U/L (ref 0–53)
AST: 14 U/L (ref 0–37)
Albumin: 4.3 g/dL (ref 3.5–5.2)
BILIRUBIN DIRECT: 0.1 mg/dL (ref 0.0–0.3)
BILIRUBIN TOTAL: 0.7 mg/dL (ref 0.2–1.2)
Indirect Bilirubin: 0.6 mg/dL (ref 0.2–1.2)
Total Protein: 6.7 g/dL (ref 6.0–8.3)

## 2014-06-24 LAB — LIPID PANEL
CHOL/HDL RATIO: 3.4 ratio
CHOLESTEROL: 110 mg/dL (ref 0–200)
HDL: 32 mg/dL — ABNORMAL LOW (ref >39)
LDL Cholesterol: 61 mg/dL (ref 0–99)
Triglycerides: 83 mg/dL (ref <150)
VLDL: 17 mg/dL (ref 0–40)

## 2014-06-24 LAB — BASIC METABOLIC PANEL
BUN: 16 mg/dL (ref 6–23)
CALCIUM: 9.4 mg/dL (ref 8.4–10.5)
CO2: 26 meq/L (ref 19–32)
CREATININE: 0.84 mg/dL (ref 0.50–1.35)
Chloride: 107 mEq/L (ref 96–112)
Glucose, Bld: 103 mg/dL — ABNORMAL HIGH (ref 70–99)
Potassium: 4.5 mEq/L (ref 3.5–5.3)
SODIUM: 143 meq/L (ref 135–145)

## 2014-06-24 MED ORDER — PRASUGREL HCL 10 MG PO TABS: 10.0000 mg | ORAL_TABLET | Freq: Every day | ORAL | Status: DC

## 2014-06-24 NOTE — Progress Notes (Signed)
Mark SproutJeffery Mark [name] Date of Birth: [birth year] Medical Record #[id number]  History of Present Illness: Mark Mark [name], Mark Mark [name], Mark Mark [name], Mark with remote ablation in 2008 per Dr. Ladona Ridgelaylor and prior history of tobacco abuse. Had STEMI in May of 2012 and was treated with BMS to the 3rd OM with residual disease noted on cath - follow up Myoview in June of 2012 was satisfactory. In April 2015 Myoview was intermediate risk. He underwent cardiac cath which showed severe stenosis in the proximal LAD. He had successful DES of the LAD. He was intolerant of Brilinta due to dyspnea and was switched to Effient.  He is not smoking. He denies any chest pain or SOB. He notes some mild dizziness if he gets up too quickly. His main exercise is his work and he does yard work with mowing on the side.  Current Outpatient Prescriptions  Medication Sig Dispense Refill  . aspirin 81 MG tablet Take 81 mg by mouth daily.       Marland Kitchen. atorvastatin (LIPITOR) 80 MG tablet Take 0.5 tablets (40 mg total) by mouth daily.  45 tablet  6  . metoprolol tartrate (LOPRESSOR) 25 MG tablet Take 1 tablet (25 mg total) by mouth 2 (two) times daily.  60 tablet  6  . NITROSTAT 0.4 MG SL tablet TAKE 1 TABLET BY MOUTH EVERY 5 MINS AN FOR UP TO 3 DOSES  25 tablet  6  . omeprazole (PRILOSEC) 40 MG capsule Take 40 mg by mouth 2 (two) times daily.       . prasugrel (EFFIENT) 10 MG TABS tablet Take 1 tablet (10 mg total) by mouth daily.  30 tablet  6   No current facility-administered medications for this visit.    Allergies  Allergen Reactions  . Penicillins     Passed out and got weak    Past Medical History  Diagnosis Date  . Mark Mark [name] (coronary artery disease)   . Hyperlipidemia   . Hypertension   . Tobacco abuse   . Mark (supraventricular tachycardia)   . STEMI (ST elevation myocardial infarction) 04/23/11  . S/P coronary artery stent placement 04/23/11    BMS to the 3rd OM; has residual disease    Past Surgical  History  Procedure Laterality Date  . Mark ablation   2008    Dr. Ladona Ridgelaylor  . Coronary stent placement  04/23/11    BMS to the 3rd OM    History  Smoking status  . Former Smoker  . Quit date: 04/23/2011  Smokeless tobacco  . Former NeurosurgeonUser  . Quit date: 04/23/2011    History  Alcohol Use No    Family History  Problem Relation Age of Onset  . Hypertension Mother     Review of Systems: The review of systems is per the HPI.  All other systems were reviewed and are negative.  Physical Exam: BP 125/86  Pulse 86  Ht 5\' 7"  (1.702 m)  Wt 155 lb 6.4 oz (70.489 kg)  BMI 24.33 kg/m2 Patient is very pleasant and in no acute distress. Skin is warm and dry. Color is normal.  HEENT is unremarkable. Normocephalic/atraumatic. PERRL. Sclera are nonicteric. Neck is supple. No masses. No JVD. Lungs are clear. Cardiac exam shows a regular rate and rhythm. Abdomen is soft. Extremities are without edema. Radial site is without hematoma. Gait and ROM are intact. No gross neurologic deficits noted.  LABORATORY DATA: Lab Results  Component Value Date  WBC 11.5* 04/14/2014   HGB 14.6 04/14/2014   HCT 43.7 04/14/2014   PLT 206 04/14/2014   GLUCOSE 111* 04/14/2014   CHOL 134 09/09/2013   TRIG 75.0 09/09/2013   HDL 36.40* 09/09/2013   LDLCALC 83 09/09/2013   ALT 29 09/09/2013   AST 21 09/09/2013   NA 141 04/14/2014   K 4.0 04/14/2014   CL 104 04/14/2014   CREATININE 0.63 04/14/2014   BUN 12 04/14/2014   CO2 25 04/14/2014   TSH 1.518 04/23/2011   INR 1.0 04/11/2014   HGBA1C  Value: 6.0 (NOTE)                                                                       According to the ADA Clinical Practice Recommendations for 2011, when HbA1c is used as a screening test:   >=6.5%   Diagnostic of Diabetes Mellitus           (if abnormal result  is confirmed)  5.7-6.4%   Increased risk of developing Diabetes Mellitus  References:Diagnosis and Classification of Diabetes Mellitus,Diabetes Care,2011,34(Suppl 1):S62-S69 and  Standards of Medical Care in         Diabetes - 2011,Diabetes Care,2011,34  (Suppl 1):S11-S61.* 04/23/2011   Assessment / Plan: 1. Mark Mark [name] - with past PCI of the OM- still patent. New lesion of the proximal LAD. S/p DES 04/13/14. Continue dual antiplatelet therapy for at least one year.  2. Mark Mark [name] - BP is controlled. No change on current therapy.   3. Mark [name] - on high dose statin therapy. Stressed importance of dietary modification. Will check fasting lab work today.  4. Tobacco abuse - now quit.  I will follow up in 6 months.

## 2014-06-24 NOTE — Patient Instructions (Signed)
Continue your current therapy  We will check lab work today  I will see you in 6 months   

## 2014-06-27 ENCOUNTER — Other Ambulatory Visit: Payer: Self-pay | Admitting: Cardiology

## 2014-09-30 ENCOUNTER — Other Ambulatory Visit: Payer: Self-pay

## 2014-11-21 ENCOUNTER — Other Ambulatory Visit: Payer: Self-pay | Admitting: *Deleted

## 2014-11-21 MED ORDER — ATORVASTATIN CALCIUM 80 MG PO TABS: 40.0000 mg | ORAL_TABLET | Freq: Every day | ORAL | Status: DC

## 2014-11-24 ENCOUNTER — Encounter (HOSPITAL_COMMUNITY): Payer: Self-pay | Admitting: Cardiology

## 2014-12-10 ENCOUNTER — Inpatient Hospital Stay (HOSPITAL_COMMUNITY)
Admission: EM | Admit: 2014-12-10 | Discharge: 2014-12-12 | DRG: 310 | Disposition: A | Discharge status: Home / Self Care | Payer: Federal, State, Local not specified - PPO | Attending: Cardiology | Admitting: Cardiology

## 2014-12-10 ENCOUNTER — Emergency Department (HOSPITAL_COMMUNITY): Payer: Federal, State, Local not specified - PPO

## 2014-12-10 ENCOUNTER — Encounter (HOSPITAL_COMMUNITY): Payer: Self-pay | Admitting: Emergency Medicine

## 2014-12-10 DIAGNOSIS — I4891 Unspecified atrial fibrillation: Secondary | ICD-10-CM | POA: Diagnosis present

## 2014-12-10 DIAGNOSIS — E785 Hyperlipidemia, unspecified: Secondary | ICD-10-CM | POA: Diagnosis present

## 2014-12-10 DIAGNOSIS — Z7982 Long term (current) use of aspirin: Secondary | ICD-10-CM

## 2014-12-10 DIAGNOSIS — I10 Essential (primary) hypertension: Secondary | ICD-10-CM | POA: Diagnosis present

## 2014-12-10 DIAGNOSIS — Z79899 Other long term (current) drug therapy: Secondary | ICD-10-CM

## 2014-12-10 DIAGNOSIS — I2510 Atherosclerotic heart disease of native coronary artery without angina pectoris: Secondary | ICD-10-CM | POA: Diagnosis present

## 2014-12-10 DIAGNOSIS — Z955 Presence of coronary angioplasty implant and graft: Secondary | ICD-10-CM

## 2014-12-10 DIAGNOSIS — I252 Old myocardial infarction: Secondary | ICD-10-CM

## 2014-12-10 DIAGNOSIS — Z87891 Personal history of nicotine dependence: Secondary | ICD-10-CM

## 2014-12-10 DIAGNOSIS — Z880 Allergy status to penicillin: Secondary | ICD-10-CM

## 2014-12-10 LAB — BASIC METABOLIC PANEL
Anion gap: 10 (ref 5–15)
BUN: 15 mg/dL (ref 6–23)
CO2: 23 mmol/L (ref 19–32)
CREATININE: 0.92 mg/dL (ref 0.50–1.35)
Calcium: 9.6 mg/dL (ref 8.4–10.5)
Chloride: 105 mEq/L (ref 96–112)
GFR calc non Af Amer: >90 mL/min (ref >90)
Glucose, Bld: 111 mg/dL — ABNORMAL HIGH (ref 70–99)
Potassium: 4.3 mmol/L (ref 3.5–5.1)
Sodium: 138 mmol/L (ref 135–145)

## 2014-12-10 LAB — CBC
HCT: 47.3 % (ref 39.0–52.0)
Hemoglobin: 16.1 g/dL (ref 13.0–17.0)
MCH: 29.8 pg (ref 26.0–34.0)
MCHC: 34 g/dL (ref 30.0–36.0)
MCV: 87.6 fL (ref 78.0–100.0)
Platelets: 256 10*3/uL (ref 150–400)
RBC: 5.4 MIL/uL (ref 4.22–5.81)
RDW: 13.1 % (ref 11.5–15.5)
WBC: 11.4 10*3/uL — AB (ref 4.0–10.5)

## 2014-12-10 LAB — I-STAT TROPONIN, ED
Troponin i, poc: 0 ng/mL (ref 0.00–0.08)
Troponin i, poc: 0 ng/mL (ref 0.00–0.08)

## 2014-12-10 LAB — PROTIME-INR
INR: 1.04 (ref 0.00–1.49)
Prothrombin Time: 13.7 seconds (ref 11.6–15.2)

## 2014-12-10 LAB — APTT: aPTT: 22 seconds — ABNORMAL LOW (ref 24–37)

## 2014-12-10 MED ORDER — DILTIAZEM LOAD VIA INFUSION
20.0000 mg | Freq: Once | INTRAVENOUS | Status: AC
  Administered 2014-12-10: 20 mg via INTRAVENOUS
  Filled 2014-12-10: qty 20

## 2014-12-10 MED ORDER — SODIUM CHLORIDE 0.9 % IV SOLN
1000.0000 mL | INTRAVENOUS | Status: DC
  Administered 2014-12-10: 1000 mL via INTRAVENOUS

## 2014-12-10 MED ORDER — DILTIAZEM HCL 100 MG IV SOLR
5.0000 mg/h | INTRAVENOUS | Status: DC
  Administered 2014-12-10: 5 mg/h via INTRAVENOUS

## 2014-12-10 NOTE — ED Provider Notes (Signed)
CSN: [id number]     Arrival date & time 12/10/14  1931 History  First MD Initiated Contact with Patient 12/10/14 1946     Chief Complaint  Patient presents with  . Chest Pain  . Tachycardia   HPI The patient presents to the emergency room with complaints of palpitations that started this evening about 6:30 PM. The patient has a history of SVT as well as atrial fibrillation. He has had an ablation procedure in the past. Patient also has history of coronary artery disease.  Patient denies any trouble with chest pain or shortness of breath this evening. Feel lightheaded. He denies any abdominal pain, fevers or coughing. Patient has noticed that he's had some intermittent spells over the last several months. They were very brief so he had not bothered to speak to his doctor. He does have a follow-up appointment coming up and was planning on talking to his doctor about it at that point. Past Medical History  Diagnosis Date  . CAD (coronary artery disease)   . Hyperlipidemia   . Hypertension   . Tobacco abuse   . SVT (supraventricular tachycardia)   . STEMI (ST elevation myocardial infarction) 04/23/11  . S/P coronary artery stent placement 04/23/11    BMS to the 3rd OM; has residual disease   Past Surgical History  Procedure Laterality Date  . Svt ablation   2008    Dr. Ladona Ridgelaylor  . Coronary stent placement  04/23/11    BMS to the 3rd OM  . Left heart catheterization with coronary angiogram N/A 04/13/2014    Procedure: LEFT HEART CATHETERIZATION WITH CORONARY ANGIOGRAM;  Surgeon: Peter M SwazilandJordan, MD;  [facility];  Service: Cardiovascular;  Laterality: N/A;  . Percutaneous coronary stent intervention (pci-s) Right 04/13/2014    Procedure: PERCUTANEOUS CORONARY STENT INTERVENTION (PCI-S);  Surgeon: Peter M SwazilandJordan, MD;  [facility];  Service: Cardiovascular;  Laterality: Right;   Family History  Problem Relation Age of Onset  . Hypertension Mother    History  Substance Use  Topics  . Smoking status: Former Smoker    Quit date: 04/23/2011  . Smokeless tobacco: Former NeurosurgeonUser    Quit date: 04/23/2011  . Alcohol Use: No    Review of Systems  All other systems reviewed and are negative.     Allergies  Penicillins  Home Medications   Prior to Admission medications   Medication Sig Start Date End Date Taking? Authorizing Provider  aspirin 81 MG tablet Take 81 mg by mouth daily.     Historical Provider, MD  atorvastatin (LIPITOR) 80 MG tablet Take 0.5 tablets (40 mg total) by mouth daily. 11/21/14   Peter M SwazilandJordan, MD  metoprolol tartrate (LOPRESSOR) 25 MG tablet TAKE 1 TABLET BY MOUTH TWICE DAILY 06/27/14   Peter M SwazilandJordan, MD  NITROSTAT 0.4 MG SL tablet TAKE 1 TABLET BY MOUTH EVERY 5 MINS AN FOR UP TO 3 DOSES 01/15/14   Cassell Clementhomas Brackbill, MD  omeprazole (PRILOSEC) 40 MG capsule Take 40 mg by mouth 2 (two) times daily.     Historical Provider, MD  prasugrel (EFFIENT) 10 MG TABS tablet Take 1 tablet (10 mg total) by mouth daily. 06/24/14   Peter M SwazilandJordan, MD   BP 123/69 mmHg  Pulse 167  Temp(Src) 98.3 F (36.8 C) (Oral)  Resp 18  SpO2 96% Physical Exam  Constitutional: He appears well-developed and well-nourished. No distress.  HENT:  Head: Normocephalic and atraumatic.  Right Ear: External ear normal.  Left Ear: External ear normal.  Eyes: Conjunctivae are normal. Right eye exhibits no discharge. Left eye exhibits no discharge. No scleral icterus.  Neck: Neck supple. No tracheal deviation present.  Cardiovascular: Normal rate and intact distal pulses.  An irregularly irregular rhythm present.  Pulmonary/Chest: Effort normal and breath sounds normal. No stridor. No respiratory distress. He has no wheezes. He has no rales.  Abdominal: Soft. Bowel sounds are normal. He exhibits no distension. There is no tenderness. There is no rebound and no guarding.  Musculoskeletal: He exhibits no edema or tenderness.  Neurological: He is alert. He has normal strength.  No cranial nerve deficit (no facial droop, extraocular movements intact, no slurred speech) or sensory deficit. He exhibits normal muscle tone. He displays no seizure activity. Coordination normal.  Skin: Skin is warm and dry. No rash noted.  Psychiatric: He has a normal mood and affect.  Nursing note and vitals reviewed.   ED Course  Procedures (including critical care time) CRITICAL CARE Performed by: ZOXWR,UEAKNAPP,Ayrabella Labombard Total critical care time: 45 Critical care time was exclusive of separately billable procedures and treating other patients. Critical care was necessary to treat or prevent imminent or life-threatening deterioration. Critical care was time spent personally by me on the following activities: development of treatment plan with patient and/or surrogate as well as nursing, discussions with consultants, evaluation of patient's response to treatment, examination of patient, obtaining history from patient or surrogate, ordering and performing treatments and interventions, ordering and review of laboratory studies, ordering and review of radiographic studies, pulse oximetry and re-evaluation of patient's condition.  Labs Review Labs Reviewed  CBC - Abnormal; Notable for the following:    WBC 11.4 (*)    All other components within normal limits  BASIC METABOLIC PANEL - Abnormal; Notable for the following:    Glucose, Bld 111 (*)    All other components within normal limits  APTT - Abnormal; Notable for the following:    aPTT 22 (*)    All other components within normal limits  PROTIME-INR  I-STAT TROPOININ, ED  Rosezena SensorI-STAT TROPOININ, ED    Imaging Review Dg Chest Port 1 View  12/10/2014   CLINICAL DATA:  Chest pain and tachycardia  EXAM: PORTABLE CHEST - 1 VIEW  COMPARISON:  04/11/2014  FINDINGS: Cardiac shadow is stable. Mild scarring is noted in the left lung base. No focal infiltrate is seen. No bony abnormality is noted.  IMPRESSION: No acute abnormality seen.   Electronically  Signed   By: Alcide CleverMark  Lukens M.D.   On: 12/10/2014 20:22     EKG Interpretation   Date/Time:  Saturday December 10 2014 19:38:13 EST Ventricular Rate:  170 PR Interval:    QRS Duration: 94 QT Interval:  286 QTC Calculation: 480 R Axis:   20 Text Interpretation:  Atrial fibrillation with rapid ventricular response  , new since last tracing Incomplete right bundle branch block ST  depression, consider subendocardial injury , new since last tracing  Abnormal ECG Confirmed by Roxanna Mcever  MD-J, Clete Kuch ([id number]) on 12/10/2014 7:47:12  PM     Medications  0.9 %  sodium chloride infusion (1,000 mLs Intravenous New Bag/Given 12/10/14 2004)  diltiazem (CARDIZEM) 1 mg/mL load via infusion 20 mg (0 mg Intravenous Stopped 12/10/14 2015)    And  diltiazem (CARDIZEM) 100 mg in dextrose 5 % 100 mL (1 mg/mL) infusion (15 mg/hr Intravenous Rate/Dose Change 12/10/14 2120)    MDM   Final diagnoses:  Atrial fibrillation with rapid ventricular response  Pt's heart rate has improved with the cardizem drip.  BPs are in the 90s but he is comfortable in no distress.  Labs and xray reassuring.   Discussed with cardiology.  Will plan on admission, further treatment.    Linwood Dibbles, MD 12/10/14 2152

## 2014-12-10 NOTE — ED Notes (Signed)
Pt. reports mid chest " discomfort" /palpitations onset this evening , denies SOB /nausea or diaphoresis . Heart rate 160's-170's at arrival .

## 2014-12-10 NOTE — ED Notes (Signed)
MD at bedside. 

## 2014-12-10 NOTE — ED Notes (Signed)
Dr. Lynelle DoctorKnapp aware of patients BP, pt remains asymptomatic at this time. Per Dr. Lynelle DoctorKnapp, to decrease cardizem to 10mg /hr

## 2014-12-11 DIAGNOSIS — Z7982 Long term (current) use of aspirin: Secondary | ICD-10-CM | POA: Diagnosis not present

## 2014-12-11 DIAGNOSIS — I10 Essential (primary) hypertension: Secondary | ICD-10-CM | POA: Diagnosis present

## 2014-12-11 DIAGNOSIS — Z87891 Personal history of nicotine dependence: Secondary | ICD-10-CM | POA: Diagnosis not present

## 2014-12-11 DIAGNOSIS — Z880 Allergy status to penicillin: Secondary | ICD-10-CM | POA: Diagnosis not present

## 2014-12-11 DIAGNOSIS — I252 Old myocardial infarction: Secondary | ICD-10-CM | POA: Diagnosis not present

## 2014-12-11 DIAGNOSIS — I4891 Unspecified atrial fibrillation: Secondary | ICD-10-CM | POA: Diagnosis present

## 2014-12-11 DIAGNOSIS — E785 Hyperlipidemia, unspecified: Secondary | ICD-10-CM | POA: Diagnosis present

## 2014-12-11 DIAGNOSIS — Z79899 Other long term (current) drug therapy: Secondary | ICD-10-CM | POA: Diagnosis not present

## 2014-12-11 DIAGNOSIS — I2510 Atherosclerotic heart disease of native coronary artery without angina pectoris: Secondary | ICD-10-CM | POA: Diagnosis present

## 2014-12-11 DIAGNOSIS — Z955 Presence of coronary angioplasty implant and graft: Secondary | ICD-10-CM | POA: Diagnosis not present

## 2014-12-11 LAB — MAGNESIUM: Magnesium: 2.1 mg/dL (ref 1.5–2.5)

## 2014-12-11 LAB — I-STAT TROPONIN, ED: TROPONIN I, POC: 0 ng/mL (ref 0.00–0.08)

## 2014-12-11 LAB — TSH: TSH: 1.458 u[IU]/mL (ref 0.350–4.500)

## 2014-12-11 MED ORDER — PANTOPRAZOLE SODIUM 40 MG PO TBEC
40.0000 mg | DELAYED_RELEASE_TABLET | Freq: Every day | ORAL | Status: DC
  Administered 2014-12-11 – 2014-12-12 (×2): 40 mg via ORAL
  Filled 2014-12-11 (×2): qty 1

## 2014-12-11 MED ORDER — PRASUGREL HCL 10 MG PO TABS
10.0000 mg | ORAL_TABLET | Freq: Every day | ORAL | Status: DC
  Administered 2014-12-11 – 2014-12-12 (×2): 10 mg via ORAL
  Filled 2014-12-11 (×2): qty 1

## 2014-12-11 MED ORDER — ATORVASTATIN CALCIUM 40 MG PO TABS
40.0000 mg | ORAL_TABLET | Freq: Every day | ORAL | Status: DC
  Administered 2014-12-11 – 2014-12-12 (×2): 40 mg via ORAL
  Filled 2014-12-11 (×2): qty 1

## 2014-12-11 MED ORDER — DILTIAZEM HCL 30 MG PO TABS
30.0000 mg | ORAL_TABLET | Freq: Four times a day (QID) | ORAL | Status: DC
  Administered 2014-12-11 – 2014-12-12 (×8): 30 mg via ORAL
  Filled 2014-12-11 (×10): qty 1

## 2014-12-11 MED ORDER — ASPIRIN EC 81 MG PO TBEC
81.0000 mg | DELAYED_RELEASE_TABLET | Freq: Every day | ORAL | Status: DC
  Administered 2014-12-11: 81 mg via ORAL
  Filled 2014-12-11: qty 1

## 2014-12-11 MED ORDER — APIXABAN 5 MG PO TABS
5.0000 mg | ORAL_TABLET | Freq: Two times a day (BID) | ORAL | Status: DC
  Administered 2014-12-11 – 2014-12-12 (×3): 5 mg via ORAL
  Filled 2014-12-11 (×4): qty 1

## 2014-12-11 MED ORDER — SODIUM CHLORIDE 0.9 % IV BOLUS (SEPSIS)
500.0000 mL | Freq: Once | INTRAVENOUS | Status: AC
  Administered 2014-12-11: 500 mL via INTRAVENOUS

## 2014-12-11 MED ORDER — METOPROLOL TARTRATE 25 MG PO TABS
25.0000 mg | ORAL_TABLET | Freq: Two times a day (BID) | ORAL | Status: DC
  Administered 2014-12-11 – 2014-12-12 (×3): 25 mg via ORAL
  Filled 2014-12-11 (×5): qty 1

## 2014-12-11 MED ORDER — SODIUM CHLORIDE 0.9 % IV BOLUS (SEPSIS)
250.0000 mL | Freq: Once | INTRAVENOUS | Status: AC
Start: 2014-12-11 — End: 2014-12-11
  Administered 2014-12-11: 250 mL via INTRAVENOUS

## 2014-12-11 NOTE — Progress Notes (Signed)
Subjective:  Feeling clinically improved after rate control with IV----> PO dilt (30 mg PO q 6 hrs)  Objective:  Temp:  [97.7 F (36.5 C)-98.3 F (36.8 C)] 97.7 F (36.5 C) (12/27 0432) Pulse Rate:  [64-177] 71 (12/27 0958) Resp:  [14-23] 15 (12/27 0432) BP: (80-135)/(49-74) 86/61 mmHg (12/27 0958) SpO2:  [92 %-96 %] 96 % (12/27 0432) Weight:  [158 lb 11.7 oz (72 kg)] 158 lb 11.7 oz (72 kg) (12/27 0432) Weight change:   Intake/Output from previous day:    Intake/Output from this shift:    Physical Exam: General appearance: alert and no distress Neck: no adenopathy, no carotid bruit, no JVD, supple, symmetrical, trachea midline and thyroid not enlarged, symmetric, no tenderness/mass/nodules Lungs: clear to auscultation bilaterally Heart: irregularly irregular rhythm Extremities: extremities normal, atraumatic, no cyanosis or edema  Lab Results: Results for orders placed or performed during the hospital encounter of 12/10/14 (from the past 48 hour(s))  CBC     Status: Abnormal   Collection Time: 12/10/14  7:39 PM  Result Value Ref Range   WBC 11.4 (H) 4.0 - 10.5 K/uL   RBC 5.40 4.22 - 5.81 MIL/uL   Hemoglobin 16.1 13.0 - 17.0 g/dL   HCT 47.3 39.0 - 52.0 %   MCV 87.6 78.0 - 100.0 fL   MCH 29.8 26.0 - 34.0 pg   MCHC 34.0 30.0 - 36.0 g/dL   RDW 13.1 11.5 - 15.5 %   Platelets 256 150 - 400 K/uL  Basic metabolic panel     Status: Abnormal   Collection Time: 12/10/14  7:39 PM  Result Value Ref Range   Sodium 138 135 - 145 mmol/L    Comment: Please note change in reference range.   Potassium 4.3 3.5 - 5.1 mmol/L    Comment: Please note change in reference range.   Chloride 105 96 - 112 mEq/L   CO2 23 19 - 32 mmol/L   Glucose, Bld 111 (H) 70 - 99 mg/dL   BUN 15 6 - 23 mg/dL   Creatinine, Ser 0.92 0.50 - 1.35 mg/dL   Calcium 9.6 8.4 - 10.5 mg/dL   GFR calc non Af Amer >90 >90 mL/min   GFR calc Af Amer >90 >90 mL/min    Comment: (NOTE) The eGFR has been  calculated using the CKD EPI equation. This calculation has not been validated in all clinical situations. eGFR's persistently <90 mL/min signify possible Chronic Kidney Disease.    Anion gap 10 5 - 15  I-stat troponin, ED (not at [facility])     Status: None   Collection Time: 12/10/14  7:51 PM  Result Value Ref Range   Troponin i, poc 0.00 0.00 - 0.08 ng/mL   Comment 3            Comment: Due to the release kinetics of cTnI, a negative result within the first hours of the onset of symptoms does not rule out myocardial infarction with certainty. If myocardial infarction is still suspected, repeat the test at appropriate intervals.   APTT     Status: Abnormal   Collection Time: 12/10/14  7:52 PM  Result Value Ref Range   aPTT 22 (L) 24 - 37 seconds  Protime-INR     Status: None   Collection Time: 12/10/14  7:52 PM  Result Value Ref Range   Prothrombin Time 13.7 11.6 - 15.2 seconds   INR 1.04 0.00 - 1.49  I-stat troponin, ED (0, 3, 6) - do  not order at [facility]     Status: None   Collection Time: 12/10/14 11:16 PM  Result Value Ref Range   Troponin i, poc 0.00 0.00 - 0.08 ng/mL   Comment 3            Comment: Due to the release kinetics of cTnI, a negative result within the first hours of the onset of symptoms does not rule out myocardial infarction with certainty. If myocardial infarction is still suspected, repeat the test at appropriate intervals.   I-stat troponin, ED (0, 3, 6) - do not order at [facility]     Status: None   Collection Time: 12/11/14  2:27 AM  Result Value Ref Range   Troponin i, poc 0.00 0.00 - 0.08 ng/mL   Comment 3            Comment: Due to the release kinetics of cTnI, a negative result within the first hours of the onset of symptoms does not rule out myocardial infarction with certainty. If myocardial infarction is still suspected, repeat the test at appropriate intervals.     Imaging: Imaging results have been reviewed  Assessment/Plan:   1. Active  Problems: 2.   Atrial fibrillation with RVR 3. CAD s/p remote OM stent and recent Prox LAD DES 4/14 (Jordan).   Time Spent Directly with Patient:  20 minutes  Length of Stay:  LOS: 1 day   Admitted with AFIB with RVR. Rate improved now on IV----> PO Dilt. Nl EF. Recent Prox LAD DES 4/15 (Jordan). No CP/SOB. Started on Eliquis. Will DC ASA since he will be on Effient and Elliquis. BP soft. Consolidate Diltiazem to long acting Cardizem. Follow up with PJ after DC. Will prob need DCCV vs EP eval for possible re ablation. Dr. Taylor aware.   Mark [name],Mark [name] 12/11/2014, 10:46 AM 

## 2014-12-11 NOTE — Discharge Instructions (Signed)

## 2014-12-11 NOTE — ED Notes (Signed)
Cardiologist at  Bedside. 

## 2014-12-11 NOTE — Progress Notes (Signed)
12/11/2014 5:46 AM Cardizem drip was stopped 2 hours after PO Cardizem was given to the patient per MD's instructions.  Will continue to monitor the patient. [name], [name]

## 2014-12-11 NOTE — H&P (Signed)
PCP:   RAMACHANDRAN,AJITH, MD  Primary cardiologist: Dr. SwazilandJordan   Chief Complaint:  Palpitations   HPI: This is a 53 year old Caucasian gentleman with past medical history of coronary artery disease, status post myocardial infarction and to stent placement, hyperlipidemia, hypertension, supraventricular tachycardia status post ablation 2008 who presented with palpitations that started around 6:30 PM on 12/10/2014. patient stated that he didn't have any chest pain for so breath associate with palpitations but he had feeling that something wasn't right.  Because of his previous cardiac history he decided to go to the emergency department for further evaluation. In the emergency department he was found to have A. fib with RVR and heart rate around 150 bpm He was started on diltiazem drip initially at 15 mg an hour that was subsequently weaned down to 10 mg an hour resulting in good rate control.  patient denied any alcohol intake, drug use, dehydration, fevers or chills. Patient received 1 L of normal saline that was ordered by the ED and 500 mL were ordered by me  Patient denied active symptoms of chest pain, shortness of breath, PND orthopnea, lower extremity edema, lower extremity edema, nausea vomiting.  Review of Systems:  12 systems were reviewed and were negative except mentioned in the history of present illness   Past Medical History: Past Medical History  Diagnosis Date  . CAD (coronary artery disease)   . Hyperlipidemia   . Hypertension   . Tobacco abuse   . SVT (supraventricular tachycardia)   . STEMI (ST elevation myocardial infarction) 04/23/11  . S/P coronary artery stent placement 04/23/11    BMS to the 3rd OM; has residual disease   Past Surgical History  Procedure Laterality Date  . Svt ablation   2008    Dr. Ladona Ridgelaylor  . Coronary stent placement  04/23/11    BMS to the 3rd OM  . Left heart catheterization with coronary angiogram N/A 04/13/2014    Procedure: LEFT HEART  CATHETERIZATION WITH CORONARY ANGIOGRAM;  Surgeon: Peter M SwazilandJordan, MD;  [facility];  Service: Cardiovascular;  Laterality: N/A;  . Percutaneous coronary stent intervention (pci-s) Right 04/13/2014    Procedure: PERCUTANEOUS CORONARY STENT INTERVENTION (PCI-S);  Surgeon: Peter M SwazilandJordan, MD;  [facility];  Service: Cardiovascular;  Laterality: Right;    Medications: Prior to Admission medications   Medication Sig Start Date End Date Taking? Authorizing Provider  aspirin 81 MG tablet Take 81 mg by mouth daily.    Yes Historical Provider, MD  atorvastatin (LIPITOR) 80 MG tablet Take 0.5 tablets (40 mg total) by mouth daily. 11/21/14  Yes Peter M SwazilandJordan, MD  metoprolol tartrate (LOPRESSOR) 25 MG tablet TAKE 1 TABLET BY MOUTH TWICE DAILY 06/27/14  Yes Peter M SwazilandJordan, MD  NITROSTAT 0.4 MG SL tablet TAKE 1 TABLET BY MOUTH EVERY 5 MINS AN FOR UP TO 3 DOSES 01/15/14  Yes Cassell Clementhomas Brackbill, MD  omeprazole (PRILOSEC) 40 MG capsule Take 40 mg by mouth 2 (two) times daily.    Yes Historical Provider, MD  prasugrel (EFFIENT) 10 MG TABS tablet Take 1 tablet (10 mg total) by mouth daily. 06/24/14  Yes Peter M SwazilandJordan, MD    Allergies:   Allergies  Allergen Reactions  . Penicillins     Passed out and got weak    Social History:  reports that he quit smoking about 3 years ago. He quit smokeless tobacco use about 3 years ago. He reports that he does not drink alcohol or  use illicit drugs.  Family History: Family History  Problem Relation Age of Onset  . Hypertension Mother     PHYSICAL EXAM:  Filed Vitals:   12/11/14 0145 12/11/14 0219 12/11/14 0245 12/11/14 0300  BP: 86/60 92/56 92/60  100/65  Pulse: 64 69 104 71  Temp:      TempSrc:      Resp: 15 16 14 17   SpO2: 94% 94% 95% 96%   General:  Well appearing. No respiratory difficulty HEENT: normal Neck: supple. no JVD. Carotids 2+ bilat; no bruits. No lymphadenopathy or thryomegaly appreciated. Cor: PMI nondirregularly  irregular rate and rhythm, rate controlled rubs, gallops or murmurs. Lungs: clear Abdomen: soft, nontender, nondistended. No hepatosplenomegaly. No bruits or masses. Good bowel sounds. Extremities: no cyanosis, clubbing, rash, edema Neuro: alert & oriented x 3, cranial nerves grossly intact. moves all 4 extremities w/o difficulty. Affect pleasant.  Labs on Admission:   Recent Labs  12/10/14 1939  NA 138  K 4.3  CL 105  CO2 23  GLUCOSE 111*  BUN 15  CREATININE 0.92  CALCIUM 9.6   No results for input(s): AST, ALT, ALKPHOS, BILITOT, PROT, ALBUMIN in the last 72 hours. No results for input(s): LIPASE, AMYLASE in the last 72 hours.  Recent Labs  12/10/14 1939  WBC 11.4*  HGB 16.1  HCT 47.3  MCV 87.6  PLT 256   No results for input(s): CKTOTAL, CKMB, CKMBINDEX, TROPONINI in the last 72 hours. No results for input(s): TSH, T4TOTAL, T3FREE, THYROIDAB in the last 72 hours.  Invalid input(s): FREET3 No results for input(s): VITAMINB12, FOLATE, FERRITIN, TIBC, IRON, RETICCTPCT in the last 72 hours.  Radiological Exams on Admission (all images were personally reviewed and interpreted by me, radiology reports were reviewed as well): [facility]  12/10/2014   CLINICAL DATA:  Chest pain and tachycardia  EXAM: PORTABLE CHEST - 1 VIEW  COMPARISON:  04/11/2014  FINDINGS: Cardiac shadow is stable. Mild scarring is noted in the left lung base. No focal infiltrate is seen. No bony abnormality is noted.  IMPRESSION: No acute abnormality seen.   Electronically Signed   By: Alcide CleverMark  Lukens M.D.   On: 12/10/2014 20:22    EKG personally reviewed and interpreted by me:  A. fib with RVR 150 beats per minutes, incomplete right bundle branch block, diffuse minimal ST depressions likely secondary to subendocardial ischemia caused by tachycardia   Assessment/Plan Present on Admission:  . Atrial fibrillation with RVR Coronary artery disease and hyperlipidemia -  Chronic  - will not proceed  with cardioversion as patient apparently had few episodes that might of been atrial fibrillation over the last month or so - Give first dose of metoprolol 25 mg twice a day and start Cardizem 30 mg every 6 hours first dose now -Discontinue Cardizem drip 2 hours after by mouth medications given -Started apixaban 5 mg twice a day, will need to decide if patient will benefit from triple antithrombotic therapy or aspirin can be discontinued, deferr this to the dating - Continue aspirin, Effient, Lipitor for CAD and hyperlipidemia    Mark [name] 12/11/2014, 3:35 AM

## 2014-12-11 NOTE — ED Notes (Signed)
Updated Patient on wait. Spoke to Cardiologist, said patient was next to be seen.

## 2014-12-11 NOTE — Progress Notes (Signed)
12/11/2014 11:46 PM Central Telemetry called to inform that the patient had converted to sinus rhythm.  The patient is now in sinus rhythm with a heart rate of 82 on the monitor. Will continue to monitor the patient. [name], Dody Smartt E

## 2014-12-11 NOTE — Progress Notes (Signed)
UR Completed.  [phone]  

## 2014-12-12 ENCOUNTER — Other Ambulatory Visit: Payer: Self-pay | Admitting: Physician Assistant

## 2014-12-12 DIAGNOSIS — I2510 Atherosclerotic heart disease of native coronary artery without angina pectoris: Secondary | ICD-10-CM

## 2014-12-12 DIAGNOSIS — I10 Essential (primary) hypertension: Secondary | ICD-10-CM

## 2014-12-12 MED ORDER — PRASUGREL HCL 10 MG PO TABS: 10.0000 mg | ORAL_TABLET | Freq: Every day | ORAL | Status: DC

## 2014-12-12 MED ORDER — DILTIAZEM HCL ER COATED BEADS 120 MG PO CP24: 120.0000 mg | ORAL_CAPSULE | Freq: Every day | ORAL | Status: DC

## 2014-12-12 MED ORDER — CLOPIDOGREL BISULFATE 75 MG PO TABS: 75.0000 mg | ORAL_TABLET | Freq: Every day | ORAL | Status: DC

## 2014-12-12 MED ORDER — CLOPIDOGREL BISULFATE 75 MG PO TABS
75.0000 mg | ORAL_TABLET | Freq: Every day | ORAL | Status: DC
  Administered 2014-12-12: 75 mg via ORAL
  Filled 2014-12-12: qty 1

## 2014-12-12 MED ORDER — OFF THE BEAT BOOK
Freq: Once | Status: AC
  Administered 2014-12-12: 10:00:00
  Filled 2014-12-12: qty 1

## 2014-12-12 MED ORDER — APIXABAN 5 MG PO TABS: 5.0000 mg | ORAL_TABLET | Freq: Two times a day (BID) | ORAL | Status: DC

## 2014-12-12 NOTE — Progress Notes (Signed)
Discharge education completed by RN. Pt and spouse received a copy of discharge paperwork and confirm understanding of follow up appointments and discharge medications. Both deny any questions at this time. IV removed, site is within normal limits. Pt will discharge from the unit via wheelchair. 

## 2014-12-12 NOTE — Discharge Summary (Signed)
CARDIOLOGY DISCHARGE SUMMARY   Patient ID: Mark [name] MRN: [id number] DOB/AGE: 07/09/1961 53 y.o.  Admit date: 12/10/2014 Discharge date: 12/12/2014  PCP: Georgianne FickAMACHANDRAN,AJITH, MD Primary Cardiologist: Dr. SwazilandJordan  Primary Discharge Diagnosis: Atrial fibrillation with rapid ventricular response - s/p spontaneous conversion to sinus rhythm   Secondary Discharge Diagnosis:    CAD (coronary artery disease)   HTN (hypertension)  Procedures: CXR  Hospital Course: Mark [name] is a 53 y.o. male with  history of CAD. He has been doing well but starting 6:30 PM on 12/26, he felt something wasn't right. He did not have chest pain or true palpitations but his chest felt different and he was aware something was abnormal.  In the emergency room, he was in atrial fibrillation with rapid ventricular response, heart rate 150 or more. His heart rate control improved with IV diltiazem. He also had 1.5 L of IV fluid for blood pressure support and hydration. He was admitted for further evaluation and treatment.  He was continued on the IV diltiazem as his blood pressure would allow. His heart rate control improved. He was continued on his home dose of metoprolol. Early on 12/28, he spontaneously converted to sinus rhythm.   Because his duration of atrial fibrillation was possibly more than 48 hours, he was started on Eliquis for anticoagulation. Because of the Eliquis, his aspirin was discontinued and the Effient was changed to Plavix.  Since he converted spontaneously and has no known history of atrial fibrillation, it was felt that he did not need full anticoagulation for more than 30 days. Therefore, he will take Plavix and Eliquis for 30 days, then switch back to aspirin and Effient.  Mark [name] is competent that if he has additional atrial fibrillation he will be aware of it, so an event monitor was not felt needed at this time.  A TSH was checked as well as a chest x-ray, both within  normal limits.  On 12/28, he was seen by Dr. Anne FuSkains and all data were reviewed. He was ambulating without chest pain or shortness of breath. No further inpatient workup was indicated and he is considered stable for discharge, to follow up as an outpatient with Dr. SwazilandJordan as previously arranged.  Labs:   Lab Results  Component Value Date   WBC 11.4* 12/10/2014   HGB 16.1 12/10/2014   HCT 47.3 12/10/2014   MCV 87.6 12/10/2014   PLT 256 12/10/2014     Recent Labs Lab 12/10/14 1939  NA 138  K 4.3  CL 105  CO2 23  BUN 15  CREATININE 0.92  CALCIUM 9.6  GLUCOSE 111*    Recent Labs  12/10/14 1952  INR 1.04   Lab Results  Component Value Date   TSH 1.458 12/11/2014      Radiology: Dg Chest Port 1 View  12/10/2014   CLINICAL DATA:  Chest pain and tachycardia  EXAM: PORTABLE CHEST - 1 VIEW  COMPARISON:  04/11/2014  FINDINGS: Cardiac shadow is stable. Mild scarring is noted in the left lung base. No focal infiltrate is seen. No bony abnormality is noted.  IMPRESSION: No acute abnormality seen.   Electronically Signed   By: Alcide CleverMark  Lukens M.D.   On: 12/10/2014 20:22   EKG: 12/10/2016 Atrial fibrillation, RVR Rate 170  FOLLOW UP PLANS AND APPOINTMENTS Allergies  Allergen Reactions  . Penicillins     Passed out and got weak     Medication List    STOP taking these medications  aspirin 81 MG tablet      TAKE these medications        apixaban 5 MG Tabs tablet  Commonly known as:  ELIQUIS  Take 1 tablet (5 mg total) by mouth 2 (two) times daily.     atorvastatin 80 MG tablet  Commonly known as:  LIPITOR  Take 0.5 tablets (40 mg total) by mouth daily.     clopidogrel 75 MG tablet  Commonly known as:  PLAVIX  Take 1 tablet (75 mg total) by mouth daily.     diltiazem 120 MG 24 hr capsule  Commonly known as:  CARDIZEM CD  Take 1 capsule (120 mg total) by mouth daily.     metoprolol tartrate 25 MG tablet  Commonly known as:  LOPRESSOR  TAKE 1 TABLET BY  MOUTH TWICE DAILY     NITROSTAT 0.4 MG SL tablet  Generic drug:  nitroGLYCERIN  TAKE 1 TABLET BY MOUTH EVERY 5 MINS AN FOR UP TO 3 DOSES     omeprazole 40 MG capsule  Commonly known as:  PRILOSEC  Take 40 mg by mouth 2 (two) times daily.     prasugrel 10 MG Tabs tablet  Commonly known as:  EFFIENT  Take 1 tablet (10 mg total) by mouth daily. HOLD for 30 days while on Plavix and Elliquis, then STOP Plavix, Elliquis and resume Effient  Start taking on:  01/12/2015        Discharge Instructions    Diet - low sodium heart healthy    Complete by:  As directed      Increase activity slowly    Complete by:  As directed           Follow-up Information    Follow up with Peter SwazilandJordan, MD On 12/23/2014.   Specialty:  Cardiology   Why:  at 8:00 am   Contact information:   [address] [place] [id number] [phone]       BRING ALL MEDICATIONS WITH YOU TO FOLLOW UP APPOINTMENTS  Time spent with patient to include physician time: 36 min Signed: Theodore Demarkhonda Barrett, PA-C 12/12/2014, 3:22 PM Co-Sign MD  Personally seen and examined, agree with above. Continue with anticoagulation for 1 month, NOAC. Because of NOAC changing Effient to Plavix Holding aspirin  After one month, stop NOAC and resume Effient/aspirin.  Donato SchultzSKAINS, Shanti Eichel, MD

## 2014-12-12 NOTE — Progress Notes (Addendum)
Patient Name: Gwyneth SproutJeffery Vanegas Date of Encounter: 12/12/2014  Principal Problem:   Atrial fibrillation with rapid ventricular response Active Problems:   CAD (coronary artery disease)   HTN (hypertension)   Primary Cardiologist: Dr. SwazilandJordan  Patient Profile: 53 yo male w/ hx CAD/MI, HLD, HTN, SVT w/ ablation, admitted 12/26 w/ palpitations, atrial fib, RVR.   SUBJECTIVE: No chest pain or SOB, felt abnormal in afib, even when HR was controlled.  OBJECTIVE Filed Vitals:   12/11/14 2111 12/11/14 2356 12/12/14 0548 12/12/14 1023  BP: 99/49 93/63 109/68 112/74  Pulse: 85 75 74 82  Temp: 98.4 F (36.9 C)  97.9 F (36.6 C)   TempSrc: Oral  Oral   Resp: 16  16   Height:      Weight:      SpO2: 94%  94%     Intake/Output Summary (Last 24 hours) at 12/12/14 1104 Last data filed at 12/11/14 1700  Gross per 24 hour  Intake    240 ml  Output      0 ml  Net    240 ml   Filed Weights   12/11/14 0432  Weight: 158 lb 11.7 oz (72 kg)    PHYSICAL EXAM General: Well developed, well nourished, male in no acute distress. Head: Normocephalic, atraumatic.  Neck: Supple without bruits, JVD not elevated Lungs:  Resp regular and unlabored, CTA. Heart: RRR, S1, S2, no S3, S4, or murmur; no rub. Abdomen: Soft, non-tender, non-distended, BS + x 4.  Extremities: No clubbing, cyanosis, no edema.  Neuro: Alert and oriented X 3. Moves all extremities spontaneously. Psych: Normal affect.  LABS: CBC:  Recent Labs  12/10/14 1939  WBC 11.4*  HGB 16.1  HCT 47.3  MCV 87.6  PLT 256   INR:  Recent Labs  12/10/14 1952  INR 1.04   Basic Metabolic Panel:  Recent Labs  47/82/9512/26/15 1939 12/11/14 0938  NA 138  --   K 4.3  --   CL 105  --   CO2 23  --   GLUCOSE 111*  --   BUN 15  --   CREATININE 0.92  --   CALCIUM 9.6  --   MG  --  2.1    Recent Labs  12/10/14 2316 12/11/14 0227  TROPIPOC 0.00 0.00   Thyroid Function Tests:  Recent Labs  12/11/14 0938  TSH 1.458     TELE: Atrial fib --> SR last pm       Radiology/Studies: Dg Chest Port 1 View  12/10/2014   CLINICAL DATA:  Chest pain and tachycardia  EXAM: PORTABLE CHEST - 1 VIEW  COMPARISON:  04/11/2014  FINDINGS: Cardiac shadow is stable. Mild scarring is noted in the left lung base. No focal infiltrate is seen. No bony abnormality is noted.  IMPRESSION: No acute abnormality seen.   Electronically Signed   By: Alcide CleverMark  Lukens M.D.   On: 12/10/2014 20:22     Current Medications:  . apixaban  5 mg Oral BID  . atorvastatin  40 mg Oral Daily  . diltiazem  30 mg Oral QID  . metoprolol tartrate  25 mg Oral BID  . pantoprazole  40 mg Oral Daily  . prasugrel  10 mg Oral Daily   . diltiazem (CARDIZEM) infusion Stopped (12/11/14 0539)    ASSESSMENT AND PLAN: Principal Problem:   Atrial fibrillation with rapid ventricular response - s/p spontaneous conversion to SR, Dilt is new, can change to 120 mg daily. Metoprolol held  due to low BP yesterday, but got both meds today, follow. MD advise if event monitor needed.  Active Problems:   CAD (coronary artery disease) - no acute ischemic symptoms, continue BB, Effient, statin    HTN (hypertension) - good control on current rx, BP was a little low when in afib.    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   SignedTheodore Demark, Rhonda Barrett , PA-C 11:04 AM 12/12/2014  Personally seen and examined. Agree with above. No need for monitor. Agree with dilt 120 CD QD and metoprolol/   CAD-post LAD DES prox in 04/13/2014.  On Eliquis. Change to Plavix. (stop effient)  CHADS-VAS is 1 (HTN). Continue Eliquis for 4 weeks post autoconversion (highest embolic risk up to 2 weeks post autoconversion). May stop after that and resume ASA and Effient. (Stop Plavix at that time). Gained opinion from EP partner who does not object with anticoagulation  plan.   OK for DC. Has appt with Dr. SwazilandJordan in 7 days.   Donato SchultzSKAINS, Kathy Wares, MD

## 2014-12-13 NOTE — Telephone Encounter (Signed)
Peter M SwazilandJordan, MD at 06/24/2014 8:12 AM  metoprolol tartrate (LOPRESSOR) 25 MG tablet  Take 1 tablet (25 mg total) by mouth 2 (two) times daily  Patient Instructions:   Continue your current therapy  We will check lab work today  I will see you in 6 months

## 2014-12-23 ENCOUNTER — Encounter: Payer: Self-pay | Admitting: Cardiology

## 2014-12-23 ENCOUNTER — Ambulatory Visit (INDEPENDENT_AMBULATORY_CARE_PROVIDER_SITE_OTHER): Payer: Federal, State, Local not specified - PPO | Admitting: Cardiology

## 2014-12-23 VITALS — BP 130/82 | HR 69 | Ht 67.0 in | Wt 160.0 lb

## 2014-12-23 DIAGNOSIS — I2510 Atherosclerotic heart disease of native coronary artery without angina pectoris: Secondary | ICD-10-CM

## 2014-12-23 DIAGNOSIS — I10 Essential (primary) hypertension: Secondary | ICD-10-CM | POA: Diagnosis not present

## 2014-12-23 DIAGNOSIS — I480 Paroxysmal atrial fibrillation: Secondary | ICD-10-CM | POA: Diagnosis not present

## 2014-12-23 DIAGNOSIS — Z720 Tobacco use: Secondary | ICD-10-CM

## 2014-12-23 DIAGNOSIS — I4891 Unspecified atrial fibrillation: Secondary | ICD-10-CM | POA: Insufficient documentation

## 2014-12-23 DIAGNOSIS — E785 Hyperlipidemia, unspecified: Secondary | ICD-10-CM

## 2014-12-23 NOTE — Patient Instructions (Signed)
Continue your current therapy  If you have no further atrial fibrillation we will stop Eliquis and Plavix at the end of January and resume ASA 81 mg daily and Effient 10 mg daily  Avoid caffeine  We will schedule you for an Echocardiogram  I will see you in 3 months.

## 2014-12-23 NOTE — Progress Notes (Signed)
Mark SproutJeffery [name] Date of Birth: 11-19-1961 Medical Record #[id number]  History of Present Illness: Mark [name] is seen back today for follow up hospital visit. He has HLD, CAD, HTN, SVT with remote ablation in 2008 per Dr. Ladona Ridgelaylor and prior history of tobacco abuse. Had STEMI in May of 2012 and was treated with BMS to the 3rd OM with residual disease noted on cath - follow up Myoview in June of 2012 was satisfactory. In April 2015 Myoview was intermediate risk. He underwent cardiac cath which showed severe stenosis in the proximal LAD. He had successful DES of the LAD. He was intolerant of Brilinta due to dyspnea and was switched to Effient.  He is not smoking. He denies any chest pain or SOB.  On December 26th he was admitted with new atrial fibrillation with RVR. Labs were normal. He converted spontaneously. He was placed on Eliquis and Plavix and ASA was stopped. He was started on diltiazem. Since then no recurrent symptoms. No clear triggers although he has had increased in stress at work. Drinks 2 cups of coffee daily.  Current Outpatient Prescriptions  Medication Sig Dispense Refill  . apixaban (ELIQUIS) 5 MG TABS tablet Take 1 tablet (5 mg total) by mouth 2 (two) times daily. 60 tablet 0  . atorvastatin (LIPITOR) 80 MG tablet Take 0.5 tablets (40 mg total) by mouth daily. 45 tablet 0  . clopidogrel (PLAVIX) 75 MG tablet Take 1 tablet (75 mg total) by mouth daily. 30 tablet 0  . diltiazem (CARDIZEM CD) 120 MG 24 hr capsule Take 1 capsule (120 mg total) by mouth daily. 30 capsule 11  . metoprolol tartrate (LOPRESSOR) 25 MG tablet TAKE 1 TABLET BY MOUTH TWICE DAILY 60 tablet 5  . NITROSTAT 0.4 MG SL tablet TAKE 1 TABLET BY MOUTH EVERY 5 MINS AN FOR UP TO 3 DOSES 25 tablet 6  . omeprazole (PRILOSEC) 40 MG capsule Take 40 mg by mouth 2 (two) times daily.      No current facility-administered medications for this visit.    Allergies  Allergen Reactions  . Penicillins     Passed out and got weak     Past Medical History  Diagnosis Date  . CAD (coronary artery disease)   . Hyperlipidemia   . Hypertension   . Tobacco abuse   . SVT (supraventricular tachycardia)   . STEMI (ST elevation myocardial infarction) 04/23/11  . S/P coronary artery stent placement 04/23/11    BMS to the 3rd OM; has residual disease  . Atrial fibrillation     Past Surgical History  Procedure Laterality Date  . Svt ablation   2008    Dr. Ladona Ridgelaylor  . Coronary stent placement  04/23/11    BMS to the 3rd OM  . Left heart catheterization with coronary angiogram N/A 04/13/2014    Procedure: LEFT HEART CATHETERIZATION WITH CORONARY ANGIOGRAM;  Surgeon: Kerissa Coia M SwazilandJordan, MD;  [facility];  Service: Cardiovascular;  Laterality: N/A;  . Percutaneous coronary stent intervention (pci-s) Right 04/13/2014    Procedure: PERCUTANEOUS CORONARY STENT INTERVENTION (PCI-S);  Surgeon: Cesia Orf M SwazilandJordan, MD;  [facility];  Service: Cardiovascular;  Laterality: Right;    History  Smoking status  . Former Smoker  . Quit date: 04/23/2011  Smokeless tobacco  . Former NeurosurgeonUser  . Quit date: 04/23/2011    History  Alcohol Use No    Family History  Problem Relation Age of Onset  . Hypertension Mother     Review of  Systems: The review of systems is per the HPI.  All other systems were reviewed and are negative.  Physical Exam: BP 130/82 mmHg  Pulse 69  Ht  (1.702 m)  Wt 160 lb (72.576 kg)  BMI 25.05 kg/m2 Patient is very pleasant and in no acute distress. Skin is warm and dry. Color is normal.  HEENT is unremarkable. Normocephalic/atraumatic. PERRL. Sclera are nonicteric. Neck is supple. No masses. No JVD. Lungs are clear. Cardiac exam shows a regular rate and rhythm. Normal S1-2 without gallop or murmur. Abdomen is soft. Extremities are without edema. Radial site is without hematoma. Gait and ROM are intact. No gross neurologic deficits noted.  LABORATORY DATA: Lab Results  Component Value Date   WBC  11.4* 12/10/2014   HGB 16.1 12/10/2014   HCT 47.3 12/10/2014   PLT 256 12/10/2014   GLUCOSE 111* 12/10/2014   CHOL 110 06/24/2014   TRIG 83 06/24/2014   HDL 32* 06/24/2014   LDLCALC 61 06/24/2014   ALT 19 06/24/2014   AST 14 06/24/2014   NA 138 12/10/2014   K 4.3 12/10/2014   CL 105 12/10/2014   CREATININE 0.92 12/10/2014   BUN 15 12/10/2014   CO2 23 12/10/2014   TSH 1.458 12/11/2014   INR 1.04 12/10/2014   HGBA1C * 04/23/2011    6.0 (NOTE)                                                                       According to the ADA Clinical Practice Recommendations for 2011, when HbA1c is used as a screening test:   >=6.5%   Diagnostic of Diabetes Mellitus           (if abnormal result  is confirmed)  5.7-6.4%   Increased risk of developing Diabetes Mellitus  References:Diagnosis and Classification of Diabetes Mellitus,Diabetes Care,2011,34(Suppl 1):S62-S69 and Standards of Medical Care in         Diabetes - 2011,Diabetes Care,2011,34  (Suppl 1):S11-S61.   Ecg today. NSR with normal Ecg. I have personally reviewed and interpreted this study.  Assessment / Plan: 1. CAD - remote BMS of the OM- still patent. S/p DES of LAD 04/13/14. Will resume ASA and Effient at the end of January if no recurrent Afib. Continue Plavix only for now.   2. Atrial fibrillation. New onset. Paroxysmal. Now converted to NSR. Continue Eliquis for one month then stop. check Echo. I will follow up in 3 months.  3. HLD - on high dose statin therapy. Stressed importance of dietary modification.   4. Tobacco abuse - now quit.  5. HTN- controlled on medication.

## 2014-12-29 ENCOUNTER — Ambulatory Visit (HOSPITAL_COMMUNITY)
Admission: RE | Admit: 2014-12-29 | Discharge: 2014-12-29 | Disposition: A | Discharge status: Home / Self Care | Payer: Federal, State, Local not specified - PPO | Source: Ambulatory Visit | Attending: Cardiology | Admitting: Cardiology

## 2014-12-29 DIAGNOSIS — I480 Paroxysmal atrial fibrillation: Secondary | ICD-10-CM

## 2014-12-29 DIAGNOSIS — I10 Essential (primary) hypertension: Secondary | ICD-10-CM | POA: Diagnosis not present

## 2014-12-29 DIAGNOSIS — E785 Hyperlipidemia, unspecified: Secondary | ICD-10-CM | POA: Diagnosis not present

## 2014-12-29 DIAGNOSIS — Z720 Tobacco use: Secondary | ICD-10-CM

## 2014-12-29 DIAGNOSIS — Z87891 Personal history of nicotine dependence: Secondary | ICD-10-CM | POA: Diagnosis not present

## 2014-12-29 DIAGNOSIS — I2510 Atherosclerotic heart disease of native coronary artery without angina pectoris: Secondary | ICD-10-CM

## 2014-12-29 NOTE — Progress Notes (Signed)
2D Echocardiogram Complete.  12/29/2014   Tenita Cue, RDCS  

## 2015-01-05 ENCOUNTER — Telehealth: Payer: Self-pay | Admitting: Cardiology

## 2015-01-05 NOTE — Telephone Encounter (Signed)
Returned call to patient's wife Dr.Jordan reviewed 12/29/14 echo which was normal.Follow up appointment scheduled with Dr.Jordan 03/24/15 at 8:00 am.Advised if no further atrial fib stop eliquis and plavix the end of 12/2014.Then resume aspirin 81 mg daily and effient 10 mg daily.

## 2015-01-05 NOTE — Telephone Encounter (Signed)
Would like his ultrasound test results from last Thursday please.

## 2015-01-08 ENCOUNTER — Other Ambulatory Visit: Payer: Self-pay | Admitting: Physician Assistant

## 2015-01-09 MED ORDER — DILTIAZEM HCL ER COATED BEADS 120 MG PO CP24: 120.0000 mg | ORAL_CAPSULE | Freq: Every day | ORAL | Status: DC

## 2015-01-09 NOTE — Telephone Encounter (Signed)
Spoke to patient's wife she stated husband did not need a refill for plavix.Stated he will be stopping the end of this month.Stated he needed a refill for diltiazem.Refill sent to pharmacy.

## 2015-01-09 NOTE — Telephone Encounter (Signed)
Is the patient going to continue this? Per Dr Illa LevelJordans last note, patient may be able to stop this at the end of January. Please advise. Thanks, MI

## 2015-02-17 ENCOUNTER — Other Ambulatory Visit: Payer: Self-pay | Admitting: *Deleted

## 2015-02-17 MED ORDER — ATORVASTATIN CALCIUM 80 MG PO TABS: 40.0000 mg | ORAL_TABLET | Freq: Every day | ORAL | Status: DC

## 2015-02-22 ENCOUNTER — Other Ambulatory Visit: Payer: Self-pay

## 2015-02-22 MED ORDER — ATORVASTATIN CALCIUM 80 MG PO TABS: 40.0000 mg | ORAL_TABLET | Freq: Every day | ORAL | Status: DC

## 2015-03-24 ENCOUNTER — Ambulatory Visit (INDEPENDENT_AMBULATORY_CARE_PROVIDER_SITE_OTHER): Payer: Federal, State, Local not specified - PPO | Admitting: Cardiology

## 2015-03-24 ENCOUNTER — Encounter: Payer: Self-pay | Admitting: Cardiology

## 2015-03-24 VITALS — BP 120/82 | HR 64 | Ht 67.0 in | Wt 164.0 lb

## 2015-03-24 DIAGNOSIS — I10 Essential (primary) hypertension: Secondary | ICD-10-CM | POA: Diagnosis not present

## 2015-03-24 DIAGNOSIS — I480 Paroxysmal atrial fibrillation: Secondary | ICD-10-CM | POA: Diagnosis not present

## 2015-03-24 DIAGNOSIS — I2510 Atherosclerotic heart disease of native coronary artery without angina pectoris: Secondary | ICD-10-CM | POA: Diagnosis not present

## 2015-03-24 DIAGNOSIS — E785 Hyperlipidemia, unspecified: Secondary | ICD-10-CM | POA: Diagnosis not present

## 2015-03-24 LAB — HEPATIC FUNCTION PANEL
ALBUMIN: 4.5 g/dL (ref 3.5–5.2)
ALT: 36 U/L (ref 0–53)
AST: 22 U/L (ref 0–37)
Alkaline Phosphatase: 79 U/L (ref 39–117)
Bilirubin, Direct: 0.1 mg/dL (ref 0.0–0.3)
Indirect Bilirubin: 0.6 mg/dL (ref 0.2–1.2)
Total Bilirubin: 0.7 mg/dL (ref 0.2–1.2)
Total Protein: 7 g/dL (ref 6.0–8.3)

## 2015-03-24 LAB — LIPID PANEL
CHOLESTEROL: 137 mg/dL (ref 0–200)
HDL: 33 mg/dL — ABNORMAL LOW (ref >=40)
LDL Cholesterol: 80 mg/dL (ref 0–99)
TRIGLYCERIDES: 118 mg/dL (ref <150)
Total CHOL/HDL Ratio: 4.2 Ratio
VLDL: 24 mg/dL (ref 0–40)

## 2015-03-24 LAB — BASIC METABOLIC PANEL
BUN: 12 mg/dL (ref 6–23)
CHLORIDE: 104 meq/L (ref 96–112)
CO2: 24 mEq/L (ref 19–32)
Calcium: 9.4 mg/dL (ref 8.4–10.5)
Creat: 0.78 mg/dL (ref 0.50–1.35)
Glucose, Bld: 110 mg/dL — ABNORMAL HIGH (ref 70–99)
Potassium: 5 mEq/L (ref 3.5–5.3)
SODIUM: 139 meq/L (ref 135–145)

## 2015-03-24 NOTE — Progress Notes (Signed)
Mark [name] Date of Birth: [birth year] Medical Record #[id number]  History of Present Illness: Mark [name] is seen for follow up Atrial fibrillation and CAD. He has HLD, CAD, HTN, SVT with remote ablation in 2008 per Dr. Ladona Ridgel and prior history of tobacco abuse. Had STEMI in May of 2012 and was treated with BMS to the 3rd OM with residual disease noted on cath - follow up Myoview in June of 2012 was satisfactory. In April 2015 Myoview was intermediate risk. He underwent cardiac cath which showed severe stenosis in the proximal LAD. He had successful DES of the LAD. He was intolerant of Brilinta due to dyspnea and was switched to Effient.   In December 2015 he was admitted with new atrial fibrillation with RVR. Labs were normal. He converted spontaneously. He was placed on Eliquis and Plavix and ASA was stopped. He was started on diltiazem. Since then no recurrent symptoms. Rare "blip" in rhythm. No chest pain or SOB. He stopped Eliquis at the end of January. Now on ASA and Effient. He is active.   Current Outpatient Prescriptions  Medication Sig Dispense Refill  . aspirin 81 MG tablet Take 81 mg by mouth daily.    Marland Kitchen atorvastatin (LIPITOR) 80 MG tablet Take 0.5 tablets (40 mg total) by mouth daily. 45 tablet 3  . metoprolol tartrate (LOPRESSOR) 25 MG tablet TAKE 1 TABLET BY MOUTH TWICE DAILY 60 tablet 5  . NITROSTAT 0.4 MG SL tablet TAKE 1 TABLET BY MOUTH EVERY 5 MINS AN FOR UP TO 3 DOSES 25 tablet 6  . omeprazole (PRILOSEC) 20 MG capsule Take 20 mg by mouth daily.     No current facility-administered medications for this visit.    Allergies  Allergen Reactions  . Penicillins     Passed out and got weak    Past Medical History  Diagnosis Date  . CAD (coronary artery disease)   . Hyperlipidemia   . Hypertension   . Tobacco abuse   . SVT (supraventricular tachycardia)   . STEMI (ST elevation myocardial infarction) 04/23/11  . S/P coronary artery stent placement 04/23/11    BMS to the 3rd OM;  has residual disease  . Atrial fibrillation     Past Surgical History  Procedure Laterality Date  . Svt ablation   2008    Dr. Ladona Ridgel  . Coronary stent placement  04/23/11    BMS to the 3rd OM  . Left heart catheterization with coronary angiogram N/A 04/13/2014    Procedure: LEFT HEART CATHETERIZATION WITH CORONARY ANGIOGRAM;  Surgeon: Peter M Swaziland, MD;  Location: [facility] CATH LAB;  Service: Cardiovascular;  Laterality: N/A;  . Percutaneous coronary stent intervention (pci-s) Right 04/13/2014    Procedure: PERCUTANEOUS CORONARY STENT INTERVENTION (PCI-S);  Surgeon: Peter M [place], MD;  Location: [facility] CATH LAB;  Service: Cardiovascular;  Laterality: Right;    History  Smoking status  . Former Smoker  . Quit date: 04/23/2011  Smokeless tobacco  . Former Neurosurgeon  . Quit date: 04/23/2011    History  Alcohol Use No    Family History  Problem Relation Age of Onset  . Hypertension Mother     Review of Systems: The review of systems is per the HPI.  All other systems were reviewed and are negative.  Physical Exam: BP 120/82 mmHg  Pulse 64  Ht  (1.702 m)  Wt 164 lb (74.39 kg)  BMI 25.68 kg/m2 Patient is very pleasant and in no acute distress. Skin is warm and dry.  Color is normal.  HEENT is unremarkable. Normocephalic/atraumatic. PERRL. Sclera are nonicteric. Neck is supple. No masses. No JVD. Lungs are clear. Cardiac exam shows a regular rate and rhythm. Normal S1-2 without gallop or murmur. Abdomen is soft. Extremities are without edema.  Gait and ROM are intact. No gross neurologic deficits noted.  LABORATORY DATA: Lab Results  Component Value Date   WBC 11.4* 12/10/2014   HGB 16.1 12/10/2014   HCT 47.3 12/10/2014   PLT 256 12/10/2014   GLUCOSE 111* 12/10/2014   CHOL 110 06/24/2014   TRIG 83 06/24/2014   HDL 32* 06/24/2014   LDLCALC 61 06/24/2014   ALT 19 06/24/2014   AST 14 06/24/2014   NA 138 12/10/2014   K 4.3 12/10/2014   CL 105 12/10/2014   CREATININE 0.92  12/10/2014   BUN 15 12/10/2014   CO2 23 12/10/2014   TSH 1.458 12/11/2014   INR 1.04 12/10/2014   HGBA1C * 04/23/2011    6.0 (NOTE)                                                                       According to the ADA Clinical Practice Recommendations for 2011, when HbA1c is used as a screening test:   >=6.5%   Diagnostic of Diabetes Mellitus           (if abnormal result  is confirmed)  5.7-6.4%   Increased risk of developing Diabetes Mellitus  References:Diagnosis and Classification of Diabetes Mellitus,Diabetes Care,2011,34(Suppl 1):S62-S69 and Standards of Medical Care in         Diabetes - 2011,Diabetes Care,2011,34  (Suppl 1):S11-S61.     Assessment / Plan: 1. CAD - remote BMS of the OM- still patent. S/p DES of LAD 04/13/14. Will continue ASA. He may stop Effient now.   2. Atrial fibrillation. New onset. Paroxysmal. Converted  to NSR spontaneously. Echo unremarkable. For now will monitor for recurrence. If he does have recurrence will need to consider long term anticoagulation.   3. HLD - on high dose statin therapy. Stressed importance of dietary modification. Will check fasting lab work today.   4. Tobacco abuse - now quit.  5. HTN- controlled on medication.  I will follow up in 6 months.

## 2015-03-24 NOTE — Patient Instructions (Signed)
Stop taking Effient.   Continue your other therapy  We will check fasting lab work today  I will see you in 6 months.

## 2015-06-15 ENCOUNTER — Other Ambulatory Visit: Payer: Self-pay

## 2015-06-15 MED ORDER — METOPROLOL TARTRATE 25 MG PO TABS: 25.0000 mg | ORAL_TABLET | Freq: Two times a day (BID) | ORAL | Status: DC

## 2015-07-31 ENCOUNTER — Other Ambulatory Visit: Payer: Self-pay | Admitting: *Deleted

## 2015-07-31 MED ORDER — DILTIAZEM HCL ER COATED BEADS 120 MG PO CP24: 120.0000 mg | ORAL_CAPSULE | Freq: Every day | ORAL | Status: DC

## 2015-08-01 ENCOUNTER — Other Ambulatory Visit: Payer: Self-pay | Admitting: *Deleted

## 2015-08-17 ENCOUNTER — Other Ambulatory Visit: Payer: Self-pay | Admitting: *Deleted

## 2015-08-17 MED ORDER — METOPROLOL TARTRATE 25 MG PO TABS: 25.0000 mg | ORAL_TABLET | Freq: Two times a day (BID) | ORAL | Status: DC

## 2015-08-17 MED ORDER — DILTIAZEM HCL ER COATED BEADS 120 MG PO CP24: 120.0000 mg | ORAL_CAPSULE | Freq: Every day | ORAL | Status: DC

## 2015-10-27 ENCOUNTER — Encounter: Payer: Self-pay | Admitting: Cardiology

## 2015-10-27 ENCOUNTER — Ambulatory Visit (INDEPENDENT_AMBULATORY_CARE_PROVIDER_SITE_OTHER): Payer: Federal, State, Local not specified - PPO | Admitting: Cardiology

## 2015-10-27 VITALS — BP 130/84 | HR 68 | Ht 67.0 in | Wt 164.2 lb

## 2015-10-27 DIAGNOSIS — I480 Paroxysmal atrial fibrillation: Secondary | ICD-10-CM | POA: Diagnosis not present

## 2015-10-27 DIAGNOSIS — E785 Hyperlipidemia, unspecified: Secondary | ICD-10-CM

## 2015-10-27 DIAGNOSIS — I2510 Atherosclerotic heart disease of native coronary artery without angina pectoris: Secondary | ICD-10-CM | POA: Diagnosis not present

## 2015-10-27 DIAGNOSIS — I10 Essential (primary) hypertension: Secondary | ICD-10-CM

## 2015-10-27 MED ORDER — APIXABAN 5 MG PO TABS: 5.0000 mg | ORAL_TABLET | Freq: Two times a day (BID) | ORAL | Status: DC

## 2015-10-27 NOTE — Patient Instructions (Signed)
Continue your current therapy  Start Eliquis 5 mg twice a day  We will have you wear an event monitor to document your rhythm.  If we document Atrial fibrillation we will arrange follow up with EP

## 2015-10-27 NOTE — Progress Notes (Signed)
Mark [name] Date of Birth: Nov 07, 1961 Medical Record #[id number]  History of Present Illness: Mark [name] is seen for follow up Atrial fibrillation and CAD. He has HLD, CAD, HTN, SVT with remote ablation in 2008 per Dr. Ladona Ridgel and prior history of tobacco abuse. Had STEMI in May of 2012 and was treated with BMS to the 3rd OM.  Follow up Myoview in June of 2012 was satisfactory. In April 2015 Myoview was intermediate risk. He underwent cardiac cath which showed severe stenosis in the proximal LAD. He had successful DES of the LAD. He was intolerant of Brilinta due to dyspnea and was switched to Effient.   In December 2015 he was admitted with new atrial fibrillation with RVR. Labs were normal. He converted spontaneously. Echo  Was normal. He was placed on Eliquis and Plavix and ASA was stopped. He was started on diltiazem. On follow up in April he was doing well and was asymptomatic.  On follow up today he reports increased symptoms of palpitations over the past month. He feels a fluttering sensation lasting seconds to 30 minutes. Denies any chest pain, SOB, or dizziness. Just doesn't feel well. No clear triggers.   Current Outpatient Prescriptions  Medication Sig Dispense Refill  . aspirin 81 MG tablet Take 81 mg by mouth daily.    Marland Kitchen atorvastatin (LIPITOR) 80 MG tablet Take 0.5 tablets (40 mg total) by mouth daily. 45 tablet 3  . diltiazem (CARDIZEM CD) 120 MG 24 hr capsule Take 1 capsule (120 mg total) by mouth daily. 90 capsule 2  . metoprolol tartrate (LOPRESSOR) 25 MG tablet Take 1 tablet (25 mg total) by mouth 2 (two) times daily. 60 tablet 3  . NITROSTAT 0.4 MG SL tablet TAKE 1 TABLET BY MOUTH EVERY 5 MINS AN FOR UP TO 3 DOSES 25 tablet 6  . omeprazole (PRILOSEC) 20 MG capsule Take 20 mg by mouth daily.    Marland Kitchen apixaban (ELIQUIS) 5 MG TABS tablet Take 1 tablet (5 mg total) by mouth 2 (two) times daily. 60 tablet 11   No current facility-administered medications for this visit.    Allergies   Allergen Reactions  . Penicillins     Passed out and got weak    Past Medical History  Diagnosis Date  . CAD (coronary artery disease)   . Hyperlipidemia   . Hypertension   . Tobacco abuse   . SVT (supraventricular tachycardia) (HCC)   . STEMI (ST elevation myocardial infarction) (HCC) 04/23/11  . S/P coronary artery stent placement 04/23/11    BMS to the 3rd OM; has residual disease  . Atrial fibrillation [facility])     Past Surgical History  Procedure Laterality Date  . Svt ablation   2008    Dr. Ladona Ridgel  . Coronary stent placement  04/23/11    BMS to the 3rd OM  . Left heart catheterization with coronary angiogram N/A 04/13/2014    Procedure: LEFT HEART CATHETERIZATION WITH CORONARY ANGIOGRAM;  Surgeon: Peter M Swaziland, MD;  Location: [facility] CATH LAB;  Service: Cardiovascular;  Laterality: N/A;  . Percutaneous coronary stent intervention (pci-s) Right 04/13/2014    Procedure: PERCUTANEOUS CORONARY STENT INTERVENTION (PCI-S);  Surgeon: Peter M Swaziland, MD;  Location: [facility] C[facility];  Service: Cardiovascular;  Laterality: Right;    History  Smoking status  . Former Smoker  . Quit date: 04/23/2011  Smokeless tobacco  . Former Neurosurgeon  . Quit date: 04/23/2011    History  Alcohol Use No    Family History  Problem Relation Age of Onset  . Hypertension Mother     Review of Systems: The review of systems is per the HPI.  All other systems were reviewed and are negative.  Physical Exam: BP 130/84 mmHg  Pulse 68  Ht  (1.702 m)  Wt 74.503 kg (164 lb 4 oz)  BMI 25.72 kg/m2 Patient is very pleasant and in no acute distress. Skin is warm and dry. Color is normal.  HEENT is unremarkable. Normocephalic/atraumatic. PERRL. Sclera are nonicteric. Neck is supple. No masses. No JVD. Lungs are clear. Cardiac exam shows a regular rate and rhythm. Normal S1-2 without gallop or murmur. Abdomen is soft. Extremities are without edema.  Gait and ROM are intact. No gross neurologic deficits  noted.  LABORATORY DATA: Lab Results  Component Value Date   WBC 11.4* 12/10/2014   HGB 16.1 12/10/2014   HCT 47.3 12/10/2014   PLT 256 12/10/2014   GLUCOSE 110* 03/24/2015   CHOL 137 03/24/2015   TRIG 118 03/24/2015   HDL 33* 03/24/2015   LDLCALC 80 03/24/2015   ALT 36 03/24/2015   AST 22 03/24/2015   NA 139 03/24/2015   K 5.0 03/24/2015   CL 104 03/24/2015   CREATININE 0.78 03/24/2015   BUN 12 03/24/2015   CO2 24 03/24/2015   TSH 1.458 12/11/2014   INR 1.04 12/10/2014   HGBA1C * 04/23/2011    6.0 (NOTE)                                                                       According to the ADA Clinical Practice Recommendations for 2011, when HbA1c is used as a screening test:   >=6.5%   Diagnostic of Diabetes Mellitus           (if abnormal result  is confirmed)  5.7-6.4%   Increased risk of developing Diabetes Mellitus  References:Diagnosis and Classification of Diabetes Mellitus,Diabetes Care,2011,34(Suppl 1):S62-S69 and Standards of Medical Care in         Diabetes - 2011,Diabetes Care,2011,34  (Suppl 1):S11-S61.   Echo: 12/29/14:Study Conclusions  - Left ventricle: The cavity size was normal. Systolic function was normal. The estimated ejection fraction was in the range of 55% to 60%. Wall motion was normal; there were no regional wall motion abnormalities. Left ventricular diastolic function parameters were normal.   Assessment / Plan: 1. CAD - remote BMS of the OM- still patent. S/p DES of LAD 04/13/14. Will continue baby ASA.   2. Atrial fibrillation. Paroxysmal. His history is consistent with recurrent episodes. Recommend resuming anticoagulation with Eliquis 5 mg bid. Avoid NSAIDs. Given significant CAD/stents I would favor continuing baby ASA for now. Will have him wear an event monitor to document rhythm during these episodes. (Holter is inadequate with sporadic episodes). If Afib is documented will refer to Dr. Johney Frame for consideration of ablation versus  long term anti-arrhythmic therapy. Given his age and normal Echo I would think he would be a good candidate for ablation. He is not a candidate for IC antiarrhythmic therapy with CAD.   3. HLD - on high dose statin therapy. Stressed importance of dietary modification. Lipids satisfactory.  4. Tobacco abuse - he has quit  5. HTN- controlled on medication.  I  will follow up in 3 months.

## 2015-10-30 ENCOUNTER — Ambulatory Visit (INDEPENDENT_AMBULATORY_CARE_PROVIDER_SITE_OTHER): Payer: Federal, State, Local not specified - PPO

## 2015-10-30 DIAGNOSIS — I480 Paroxysmal atrial fibrillation: Secondary | ICD-10-CM | POA: Diagnosis not present

## 2015-10-30 DIAGNOSIS — I2510 Atherosclerotic heart disease of native coronary artery without angina pectoris: Secondary | ICD-10-CM | POA: Diagnosis not present

## 2015-10-30 DIAGNOSIS — E785 Hyperlipidemia, unspecified: Secondary | ICD-10-CM

## 2015-10-30 DIAGNOSIS — I10 Essential (primary) hypertension: Secondary | ICD-10-CM | POA: Diagnosis not present

## 2015-11-02 ENCOUNTER — Telehealth: Payer: Self-pay | Admitting: Cardiology

## 2015-11-02 NOTE — Telephone Encounter (Signed)
Please schedule the appt with the doctor who will do the the ablation.Please call her.

## 2015-11-02 NOTE — Telephone Encounter (Signed)
Pt's wife called in wanting to speak with Elnita MaxwellCheryl about if the pt needs to continue his heart monitor since he will be going to see Dr. Elberta Fortisamnitz on 11/30. Please f/u with her  Thanks

## 2015-11-02 NOTE — Telephone Encounter (Signed)
Returned call to patient's wife, advised patient needs to continue to wear monitor.[facility] [address] [facility]

## 2015-11-02 NOTE — Telephone Encounter (Signed)
Returned call to patient's wife.Dr.Jordan advised event monitor revealed recurrent AFib.He advised to schedule appointment with Dr.Allred or Dr.Camintz for consideration of ablation versus antiarrhythmic drug therapy.EP scheduler Melissa called.I left her a message to schedule.

## 2015-11-02 NOTE — Telephone Encounter (Signed)
[facility] to [facility] [address] [facility]

## 2015-11-15 ENCOUNTER — Ambulatory Visit (INDEPENDENT_AMBULATORY_CARE_PROVIDER_SITE_OTHER): Payer: Federal, State, Local not specified - PPO | Admitting: Cardiology

## 2015-11-15 ENCOUNTER — Encounter: Payer: Self-pay | Admitting: Cardiology

## 2015-11-15 VITALS — BP 132/84 | HR 77 | Ht 67.0 in | Wt 162.6 lb

## 2015-11-15 DIAGNOSIS — I481 Persistent atrial fibrillation: Secondary | ICD-10-CM | POA: Diagnosis not present

## 2015-11-15 DIAGNOSIS — I4819 Other persistent atrial fibrillation: Secondary | ICD-10-CM

## 2015-11-15 MED ORDER — METOPROLOL TARTRATE 50 MG PO TABS: 50.0000 mg | ORAL_TABLET | Freq: Two times a day (BID) | ORAL | Status: DC

## 2015-11-15 NOTE — Addendum Note (Signed)
Addended by: Baird LyonsPRICE, Ridley Schewe L on: 11/15/2015 04:44 PM   Modules accepted: Orders

## 2015-11-15 NOTE — Progress Notes (Signed)
Electrophysiology Office Note   Date:  11/15/2015   ID:  Mark [name], DOB May 19, 1961, MRN [id number]  PCP:  Georgianne Fick, MD  Cardiologist:  Peter Swaziland Primary Electrophysiologist:  Jeaninne Lodico [name], MD    Chief Complaint  Patient presents with  . Advice Only  . Atrial Fibrillation     History of Present Illness: Mark [name] is a 54 y.o. male who presents today for electrophysiology evaluation.    He has a history of atrial fibrillation, coronary artery disease , hyperlipidemia, hypertension, ablation in 2008 for possible PVCs by Dr. Ladona Ridgel , tobacco abuse. He had a STEMI in May 2012 was treated with a bare metal stent to the third OM. He  Had a cardiac cath in 2015 which showed severe stenosis of the proximal LAD for which he received a bare metal stent. In December 2015 he was admitted with new-onset atrial fibrillation. He converted spontaneously. Echo at that time was normal. He is placed on Eliquis and Plavix aspirin was stopped. He was started on diltiazem at that time.   Today he comes in to clinic complaining of palpitations. They have gotten worse and more often over the last few months to where he is having daily palpitations. They are not associated with shortness of breath or chest pain. The last up to 30 minutes. He has not noticed any clear triggers.   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   Today, he denies symptoms of chest pain, shortness of breath, orthopnea, PND, lower extremity edema, claudication, dizziness, presyncope, syncope, bleeding, or neurologic sequela. The patient is tolerating medications without difficulties and is otherwise without complaint today.    Past Medical History  Diagnosis Date  . CAD (coronary artery disease)   . Hyperlipidemia   . Hypertension   . Tobacco abuse   . SVT (supraventricular tachycardia) (HCC)   . STEMI (ST elevation myocardial  infarction) (HCC) 04/23/11  . S/P coronary artery stent placement 04/23/11    BMS to the 3rd OM; has residual disease  . Atrial fibrillation [facility])    Past Surgical History  Procedure Laterality Date  . Svt ablation   2008    Dr. Ladona Ridgel  . Coronary stent placement  04/23/11    BMS to the 3rd OM  . Left heart catheterization with coronary angiogram N/A 04/13/2014    Procedure: LEFT HEART CATHETERIZATION WITH CORONARY ANGIOGRAM;  Surgeon: Peter M Swaziland, MD;  Location: [facility] CATH LAB;  Service: Cardiovascular;  Laterality: N/A;  . Percutaneous coronary stent intervention (pci-s) Right 04/13/2014    Procedure: PERCUTANEOUS CORONARY STENT INTERVENTION (PCI-S);  Surgeon: Peter M Swaziland, MD;  Location: [facility] CATH LAB;  Service: Cardiovascular;  Laterality: Right;     Current Outpatient Prescriptions  Medication Sig Dispense Refill  . apixaban (ELIQUIS) 5 MG TABS tablet Take 1 tablet (5 mg total) by mouth 2 (two) times daily. 60 tablet 11  . aspirin 81 MG tablet Take 81 mg by mouth daily.    Marland Kitchen atorvastatin (LIPITOR) 80 MG tablet Take 0.5 tablets (40 mg total) by mouth daily. 45 tablet 3  . diltiazem (CARDIZEM CD) 120 MG 24 hr capsule Take 1 capsule (120 mg total) by mouth daily. 90 capsule 2  . metoprolol tartrate (LOPRESSOR) 25 MG tablet Take 1 tablet (25 mg total) by mouth 2 (two) times daily. 60 tablet 3  . NITROSTAT 0.4 MG SL tablet TAKE 1 TABLET BY MOUTH EVERY 5  MINS AN FOR UP TO 3 DOSES 25 tablet 6  . omeprazole (PRILOSEC) 20 MG capsule Take 20 mg by mouth every other day.     No current facility-administered medications for this visit.    Allergies:   Penicillins   Social History:  The patient  reports that he quit smoking about 4 years ago. He quit smokeless tobacco use about 4 years ago. He reports that he does not drink alcohol or use illicit drugs.   Family History:  The patient's family history includes Hypertension in his mother.    ROS:  Please see the history of present illness.    Otherwise, review of systems is positive for palpitations, snoring.   All other systems are reviewed and negative.    PHYSICAL EXAM: VS:  BP 132/84 mmHg  Pulse 77  Ht  (1.702 m)  Wt 162 lb 9.6 oz (73.755 kg)  BMI 25.46 kg/m2 , BMI Body mass index is 25.46 kg/(m^2). GEN: Well nourished, well developed, in no acute distress HEENT: normal Neck: no JVD, carotid bruits, or masses Cardiac: RRR; no murmurs, rubs, or gallops,no edema  Respiratory:  clear to auscultation bilaterally, normal work of breathing GI: soft, nontender, nondistended, + BS MS: no deformity or atrophy Skin: warm and dry Neuro:  Strength and sensation are intact Psych: euthymic mood, full affect  EKG:  EKG  ordered today. The ekg ordered today shows  Sinus rhythm , rate 77, incomplete right bundle branch block  Recent Labs: 12/10/2014: Hemoglobin 16.1; Platelets 256 12/11/2014: Magnesium 2.1; TSH 1.458 03/24/2015: ALT 36; BUN 12; Creat 0.78; Potassium 5.0; Sodium 139    Lipid Panel     Component Value Date/Time   CHOL 137 03/24/2015 0825   TRIG 118 03/24/2015 0825   HDL 33* 03/24/2015 0825   CHOLHDL 4.2 03/24/2015 0825   VLDL 24 03/24/2015 0825   LDLCALC 80 03/24/2015 0825     Wt Readings from Last 3 Encounters:  11/15/15 162 lb 9.6 oz (73.755 kg)  10/27/15 164 lb 4 oz (74.503 kg)  03/24/15 164 lb (74.39 kg)      Other studies Reviewed: Additional studies/ records that were reviewed today include: TTE 2015 Review of the above records today demonstrates:  - Left ventricle: The cavity size was normal. Systolic function was normal. The estimated ejection fraction was in the range of 55% to 60%. Wall motion was normal; there were no regional wall motion abnormalities. Left ventricular diastolic function parameters were normal.  ASSESSMENT AND PLAN:  1.  Atrial fibrillation:  Discussed advantages and disadvantages of ablation versus medical management with antiarrhythmic drugs. Success  rates of ablation are probably between 75 and 80% whereas with antiarrhythmics probably 65%. He is currently on Eliquis and Sharniece Gibbon continue on this medication. I discussed the risks of ablation including bleeding tamponade and stroke. We Jaquanna Ballentine schedule him for ablation as soon as possible.  This patients CHA2DS2-VASc Score and unadjusted Ischemic Stroke Rate (% per year) is equal to 2.2 % stroke rate/year from a score of 2  Above score calculated as 1 point each if present [CHF, HTN, DM, Vascular=MI/PAD/Aortic Plaque, Age if 65-74, or Male] Above score calculated as 2 points each if present [Age > 75, or Stroke/TIA/TE]  2.  SVT:  Has evidence of SVT on his monitor. 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 During the ablation, we Genifer Lazenby plan on EP study to determine if he has inducible arrhythmia.     Current  medicines are reviewed at length with the patient today.   The patient does not have concerns regarding his medicines.  The following changes were made today:  none  Labs/ tests ordered today include:  Orders Placed This Encounter  Procedures  . EKG 12-Lead     Disposition:   FU with Kylyn Sookram post ablation  Signed, Aelyn Stanaland Jorja LoaMartin Camillo Quadros, MD  11/15/2015 4:24 PM     [facility] [address] [place] [address] [phone] (office) [phone] (fax)

## 2015-11-15 NOTE — Patient Instructions (Addendum)
Medication Instructions:  Your physician has recommended you make the following change in your medication:  1) INCREASE Metoprolol to 50 mg twice per day.  Labwork: None ordered  Testing/Procedures: Your physician has recommended that you have an ablation. Catheter ablation is a medical procedure used to treat some cardiac arrhythmias (irregular heartbeats). During catheter ablation, a long, thin, flexible tube is put into a blood vessel in your groin (upper thigh), or neck. This tube is called an ablation catheter. It is then guided to your heart through the blood vessel. Radio frequency waves destroy small areas of heart tissue where abnormal heartbeats may cause an arrhythmia to start.   Available dates (these are subject):  2/9, 2/23  Samuella Rasool, RN will call you to arrange this procedure.  Follow-Up: To be determined once procedure is scheduled.  Thank you for choosing [facility]!!   Dory HornSherri Cathleen Yagi, RN [phone]  Cardiac Ablation Cardiac ablation is a procedure to disable a small amount of heart tissue in very specific places. The heart has many electrical connections. Sometimes these connections are abnormal and can cause the heart to beat very fast or irregularly. By disabling some of the problem areas, heart rhythm can be improved or made normal. Ablation is done for people who:   Have Wolff-Parkinson-White syndrome.   Have other fast heart rhythms (tachycardia).   Have taken medicines for an abnormal heart rhythm (arrhythmia) that resulted in:   No success.   Side effects.   May have a high-risk heartbeat that could result in death.  LET [facility] PROVIDER KNOW ABOUT:   Any allergies you have or any previous reactions you have had to X-ray dye, food (such as seafood), medicine, or tape.   All medicines you are taking, including vitamins, herbs, eye drops, creams, and over-the-counter medicines.   Previous problems you or members of your family have  had with the use of anesthetics.   Any blood disorders you have.   Previous surgeries or procedures (such as a kidney transplant) you have had.   Medical conditions you have (such as kidney failure).  RISKS AND COMPLICATIONS Generally, cardiac ablation is a safe procedure. However, problems can occur and include:   Increased risk of cancer. Depending on how long it takes to do the ablation, the dose of radiation can be high.  Bruising and bleeding where a thin, flexible tube (catheter) was inserted during the procedure.   Bleeding into the chest, especially into the sac that surrounds the heart (serious).  Need for a permanent pacemaker if the normal electrical system is damaged.   The procedure may not be fully effective, and this may not be recognized for months. Repeat ablation procedures are sometimes required. BEFORE THE PROCEDURE   Follow any instructions from your health care provider regarding eating and drinking before the procedure.   Take your medicines as directed at regular times with water, unless instructed otherwise by your health care provider. If you are taking diabetes medicine, including insulin, ask how you are to take it and if there are any special instructions you should follow. It is common to adjust insulin dosing the day of the ablation.  PROCEDURE  An ablation is usually performed in a catheterization laboratory with the guidance of fluoroscopy. Fluoroscopy is a type of X-ray that helps your health care provider see images of your heart during the procedure.   An ablation is a minimally invasive procedure. This means a small cut (incision) is made in either your  neck or groin. Your health care provider will decide where to make the incision based on your medical history and physical exam.  An IV tube will be started before the procedure begins. You will be given an anesthetic or medicine to help you relax (sedative).  The skin on your neck or  groin will be numbed. A needle will be inserted into a large vein in your neck or groin and catheters will be threaded to your heart.  A special dye that shows up on fluoroscopy pictures may be injected through the catheter. The dye helps your health care provider see the area of the heart that needs treatment.  The catheter has electrodes on the tip. When the area of heart tissue that is causing the arrhythmia is found, the catheter tip will send an electrical current to the area and "scar" the tissue. Three types of energy can be used to ablate the heart tissue:   Heat (radiofrequency energy).   Laser energy.   Extreme cold (cryoablation).   When the area of the heart has been ablated, the catheter will be taken out. Pressure will be held on the insertion site. This will help the insertion site clot and keep it from bleeding. A bandage will be placed on the insertion site.  AFTER THE PROCEDURE   After the procedure, you will be taken to a recovery area where your vital signs (blood pressure, heart rate, and breathing) will be monitored. The insertion site will also be monitored for bleeding.   You will need to lie still for 4-6 hours. This is to ensure you do not bleed from the catheter insertion site.    This information is not intended to replace advice given to you by your health care provider. Make sure you discuss any questions you have with your health care provider.   Document Released: 04/20/2009 Document Revised: 12/23/2014 Document Reviewed: 04/26/2013 Elsevier Interactive Patient Education [employer]

## 2015-11-17 ENCOUNTER — Telehealth: Payer: Self-pay | Admitting: *Deleted

## 2015-11-17 NOTE — Telephone Encounter (Signed)
Ok to speak with wife. She states he is still taking ASA.  Reviewed w/ Dr. SwazilandJordan who ordered to stop ASA. She stated she will inform patient. Eliquis samples left at front desk per request. Informed her that I would call next week to discuss Afib ablation time/date/instructions. Patient's wife verbalized understanding and agreeable to plan.

## 2015-11-23 ENCOUNTER — Encounter: Payer: Self-pay | Admitting: *Deleted

## 2015-11-23 ENCOUNTER — Telehealth: Payer: Self-pay | Admitting: *Deleted

## 2015-11-23 ENCOUNTER — Encounter: Payer: Self-pay | Admitting: Cardiology

## 2015-11-23 DIAGNOSIS — Z01812 Encounter for preprocedural laboratory examination: Secondary | ICD-10-CM

## 2015-11-23 DIAGNOSIS — I4819 Other persistent atrial fibrillation: Secondary | ICD-10-CM

## 2015-11-23 NOTE — Addendum Note (Signed)
Addended by: Baird LyonsPRICE, Tsuyako Jolley L on: 11/23/2015 09:20 AM   Modules accepted: Orders, Medications

## 2015-11-23 NOTE — Telephone Encounter (Signed)
Spoke with patient's wife (per his ok) to review AFib ablation instructions. Ablation scheduled for 01/25/16. Pre procedure labs on 01/19/16. Letter of instructions reviewed and mailed to home address. She is aware scheduler will call them to arrange post ablation f/u. Patient's wife verbalized understanding and agreeable to plan.

## 2016-01-11 ENCOUNTER — Other Ambulatory Visit: Payer: Self-pay | Admitting: *Deleted

## 2016-01-11 ENCOUNTER — Encounter: Payer: Self-pay | Admitting: Cardiology

## 2016-01-11 DIAGNOSIS — I4819 Other persistent atrial fibrillation: Secondary | ICD-10-CM

## 2016-01-16 ENCOUNTER — Encounter (HOSPITAL_COMMUNITY): Payer: Self-pay | Admitting: Nurse Practitioner

## 2016-01-16 ENCOUNTER — Ambulatory Visit (HOSPITAL_COMMUNITY)
Admission: RE | Admit: 2016-01-16 | Discharge: 2016-01-16 | Disposition: A | Discharge status: Home / Self Care | Payer: Federal, State, Local not specified - PPO | Source: Ambulatory Visit | Attending: Nurse Practitioner | Admitting: Nurse Practitioner

## 2016-01-16 VITALS — BP 122/86 | HR 76 | Ht 67.0 in | Wt 161.4 lb

## 2016-01-16 DIAGNOSIS — I481 Persistent atrial fibrillation: Secondary | ICD-10-CM | POA: Insufficient documentation

## 2016-01-16 DIAGNOSIS — I2510 Atherosclerotic heart disease of native coronary artery without angina pectoris: Secondary | ICD-10-CM | POA: Insufficient documentation

## 2016-01-16 DIAGNOSIS — I10 Essential (primary) hypertension: Secondary | ICD-10-CM

## 2016-01-16 DIAGNOSIS — I4819 Other persistent atrial fibrillation: Secondary | ICD-10-CM

## 2016-01-16 LAB — BASIC METABOLIC PANEL
ANION GAP: 8 (ref 5–15)
BUN: 14 mg/dL (ref 6–20)
CHLORIDE: 107 mmol/L (ref 101–111)
CO2: 29 mmol/L (ref 22–32)
Calcium: 9.5 mg/dL (ref 8.9–10.3)
Creatinine, Ser: 0.84 mg/dL (ref 0.61–1.24)
GFR calc Af Amer: >60 mL/min (ref >60)
Glucose, Bld: 94 mg/dL (ref 65–99)
POTASSIUM: 4.7 mmol/L (ref 3.5–5.1)
SODIUM: 144 mmol/L (ref 135–145)

## 2016-01-16 LAB — CBC
HCT: 47.4 % (ref 39.0–52.0)
HEMOGLOBIN: 16 g/dL (ref 13.0–17.0)
MCH: 29.9 pg (ref 26.0–34.0)
MCHC: 33.8 g/dL (ref 30.0–36.0)
MCV: 88.6 fL (ref 78.0–100.0)
Platelets: 221 10*3/uL (ref 150–400)
RBC: 5.35 MIL/uL (ref 4.22–5.81)
RDW: 13.4 % (ref 11.5–15.5)
WBC: 5.8 10*3/uL (ref 4.0–10.5)

## 2016-01-16 NOTE — Progress Notes (Signed)
Patient ID: Mark [name], male   DOB: 1961/10/24, 55 y.o.   MRN: [id number]     Primary Care Physician: Georgianne Fick, MD Referring Physician: Dr. Elisabeth Cara is a 55 y.o. male with a h/o afib, CAD, HLD, tobacco abuse,ablation in 2008 for possible PVC's by Dr. Ladona Ridgel that was seen by Dr. Elberta Fortis 11/30 to be considered for ablation. 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 Ablation procedure was discussed with the pt but he was undecided. Recently called back to the office and wanted to schedule. He is being seen as part of the rescheduling process. He was consented by Dr. Elberta Fortis and  procedural information was reviewed with the pt.  He is having chest CT tomorrow as part of the pre procedure work up with the ablation scheduled for 2/9. Labs will be drawn today.   Today, he denies symptoms of palpitations, chest pain, shortness of breath, orthopnea, PND, lower extremity edema, dizziness, presyncope, syncope, or neurologic sequela. The patient is tolerating medications without difficulties and is otherwise without complaint today.   Past Medical History  Diagnosis Date  . CAD (coronary artery disease)   . Hyperlipidemia   . Hypertension   . Tobacco abuse   . SVT (supraventricular tachycardia) (HCC)   . STEMI (ST elevation myocardial infarction) (HCC) 04/23/11  . S/P coronary artery stent placement 04/23/11    BMS to the 3rd OM; has residual disease  . Atrial fibrillation [facility])    Past Surgical History  Procedure Laterality Date  . Svt ablation   2008    Dr. Ladona Ridgel  . Coronary stent placement  04/23/11    BMS to the 3rd OM  . Left heart catheterization with coronary angiogram N/A 04/13/2014    Procedure: LEFT HEART CATHETERIZATION WITH CORONARY ANGIOGRAM;  Surgeon: Peter M Swaziland, MD;  Location: [facility] CATH LAB;  Service: Cardiovascular;  Laterality: N/A;  . Percutaneous coronary stent intervention (pci-s) Right 04/13/2014    Procedure: PERCUTANEOUS  CORONARY STENT INTERVENTION (PCI-S);  Surgeon: Peter M Swaziland, MD;  Location: [facility] CATH LAB;  Service: Cardiovascular;  Laterality: Right;    Current Outpatient Prescriptions  Medication Sig Dispense Refill  . apixaban (ELIQUIS) 5 MG TABS tablet Take 1 tablet (5 mg total) by mouth 2 (two) times daily. 60 tablet 11  . atorvastatin (LIPITOR) 80 MG tablet Take 0.5 tablets (40 mg total) by mouth daily. 45 tablet 3  . diltiazem (CARDIZEM CD) 120 MG 24 hr capsule Take 1 capsule (120 mg total) by mouth daily. 90 capsule 2  . metoprolol tartrate (LOPRESSOR) 50 MG tablet Take 1 tablet (50 mg total) by mouth 2 (two) times daily. 180 tablet 1  . omeprazole (PRILOSEC) 20 MG capsule Take 40 mg by mouth 2 (two) times daily.     Marland Kitchen NITROSTAT 0.4 MG SL tablet TAKE 1 TABLET BY MOUTH EVERY 5 MINS AN FOR UP TO 3 DOSES (Patient not taking: Reported on 01/16/2016) 25 tablet 6   No current facility-administered medications for this encounter.    Allergies  Allergen Reactions  . Penicillins     Passed out and got weak    Social History   Social History  . Marital Status: Married    Spouse Name: N/A  . Number of Children: 2  . Years of Education: N/A   Occupational History  . [employer]    Social History Main Topics  . Smoking status: Former Smoker    Quit date: 04/23/2011  . Smokeless tobacco:  Former Neurosurgeon    Quit date: 04/23/2011  . Alcohol Use: No  . Drug Use: No  . Sexual Activity: Yes   Other Topics Concern  . Not on file   Social History Narrative    Family History  Problem Relation Age of Onset  . Hypertension Mother     ROS- All systems are reviewed and negative except as per the HPI above  Physical Exam: Filed Vitals:   01/16/16 1331  BP: 122/86  Pulse: 76  Height:  (1.702 m)  Weight: 161 lb 6.4 oz (73.211 kg)    GEN- The patient is well appearing, alert and oriented x 3 today.   Head- normocephalic, atraumatic Eyes-  Sclera clear, conjunctiva pink Ears- hearing  intact Oropharynx- clear Neck- supple, no JVP Lymph- no cervical lymphadenopathy Lungs- Clear to ausculation bilaterally, normal work of breathing Heart- Regular rate and rhythm, no murmurs, rubs or gallops, PMI not laterally displaced GI- soft, NT, ND, + BS Extremities- no clubbing, cyanosis, or edema MS- no significant deformity or atrophy Skin- no rash or lesion Psych- euthymic mood, full affect Neuro- strength and sensation are intact  EKG- NSR normal EKG, pr int 124 ms, qrs int 90 ms, qtc 423 ms.  Epic records reviewed  Assessment and Plan: 1. 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 Elberta Fortis 11/30,  reviewed with pt and he wants to proceed Labs drawn today CT of chest  pending  tomorrow Pre procedure instruction sheet copied and given to pt  2.HTN Stable  3. CAD Stable  F/u with afib clinic post ablation as scheduled 3/10  Lupita Leash C. Matthew Folks [facility] [address] [place], [place] [address] [phone]

## 2016-01-17 ENCOUNTER — Ambulatory Visit (HOSPITAL_COMMUNITY)
Admission: RE | Admit: 2016-01-17 | Discharge: 2016-01-17 | Disposition: A | Discharge status: Home / Self Care | Payer: Federal, State, Local not specified - PPO | Source: Ambulatory Visit | Attending: Cardiology | Admitting: Cardiology

## 2016-01-17 ENCOUNTER — Encounter: Payer: Self-pay | Admitting: Cardiology

## 2016-01-17 DIAGNOSIS — I4819 Other persistent atrial fibrillation: Secondary | ICD-10-CM

## 2016-01-17 DIAGNOSIS — I481 Persistent atrial fibrillation: Secondary | ICD-10-CM | POA: Diagnosis not present

## 2016-01-17 MED ORDER — METOPROLOL TARTRATE 1 MG/ML IV SOLN
INTRAVENOUS | Status: AC
  Filled 2016-01-17: qty 5

## 2016-01-17 MED ORDER — METOPROLOL TARTRATE 1 MG/ML IV SOLN
5.0000 mg | Freq: Once | INTRAVENOUS | Status: AC
  Administered 2016-01-17: 5 mg via INTRAVENOUS

## 2016-01-17 MED ORDER — IOHEXOL 350 MG/ML SOLN
80.0000 mL | Freq: Once | INTRAVENOUS | Status: AC | PRN
  Administered 2016-01-17: 80 mL via INTRAVENOUS

## 2016-01-17 NOTE — Telephone Encounter (Signed)
This encounter was created in error - please disregard.

## 2016-01-17 NOTE — Telephone Encounter (Signed)
New problem ° ° °Pt's wife returning your call. °

## 2016-01-19 ENCOUNTER — Other Ambulatory Visit: Payer: Federal, State, Local not specified - PPO

## 2016-01-25 ENCOUNTER — Ambulatory Visit (HOSPITAL_COMMUNITY)
Admission: RE | Admit: 2016-01-25 | Discharge: 2016-01-26 | Disposition: A | Discharge status: Home / Self Care | Payer: Federal, State, Local not specified - PPO | Source: Ambulatory Visit | Attending: Cardiology | Admitting: Cardiology

## 2016-01-25 ENCOUNTER — Encounter (HOSPITAL_COMMUNITY): Payer: Self-pay | Admitting: Cardiology

## 2016-01-25 ENCOUNTER — Ambulatory Visit (HOSPITAL_COMMUNITY): Payer: Federal, State, Local not specified - PPO | Admitting: Anesthesiology

## 2016-01-25 ENCOUNTER — Encounter (HOSPITAL_COMMUNITY)
Admission: RE | Disposition: A | Discharge status: Home / Self Care | Payer: Self-pay | Source: Ambulatory Visit | Attending: Cardiology

## 2016-01-25 DIAGNOSIS — E785 Hyperlipidemia, unspecified: Secondary | ICD-10-CM | POA: Diagnosis not present

## 2016-01-25 DIAGNOSIS — Z87891 Personal history of nicotine dependence: Secondary | ICD-10-CM | POA: Diagnosis not present

## 2016-01-25 DIAGNOSIS — I252 Old myocardial infarction: Secondary | ICD-10-CM | POA: Insufficient documentation

## 2016-01-25 DIAGNOSIS — I10 Essential (primary) hypertension: Secondary | ICD-10-CM | POA: Diagnosis not present

## 2016-01-25 DIAGNOSIS — I2510 Atherosclerotic heart disease of native coronary artery without angina pectoris: Secondary | ICD-10-CM | POA: Diagnosis not present

## 2016-01-25 DIAGNOSIS — I480 Paroxysmal atrial fibrillation: Secondary | ICD-10-CM | POA: Diagnosis not present

## 2016-01-25 DIAGNOSIS — I481 Persistent atrial fibrillation: Secondary | ICD-10-CM | POA: Diagnosis not present

## 2016-01-25 DIAGNOSIS — Z7901 Long term (current) use of anticoagulants: Secondary | ICD-10-CM | POA: Insufficient documentation

## 2016-01-25 DIAGNOSIS — Z955 Presence of coronary angioplasty implant and graft: Secondary | ICD-10-CM | POA: Diagnosis not present

## 2016-01-25 DIAGNOSIS — I4891 Unspecified atrial fibrillation: Secondary | ICD-10-CM | POA: Diagnosis present

## 2016-01-25 HISTORY — PX: ELECTROPHYSIOLOGIC STUDY: SHX172A

## 2016-01-25 LAB — POCT ACTIVATED CLOTTING TIME
ACTIVATED CLOTTING TIME: 281 s
ACTIVATED CLOTTING TIME: 301 s
ACTIVATED CLOTTING TIME: 348 s
Activated Clotting Time: 142 seconds

## 2016-01-25 LAB — MRSA PCR SCREENING: MRSA by PCR: NEGATIVE

## 2016-01-25 SURGERY — ATRIAL FIBRILLATION ABLATION
Anesthesia: Monitor Anesthesia Care

## 2016-01-25 MED ORDER — HEPARIN (PORCINE) IN NACL 2-0.9 UNIT/ML-% IJ SOLN
INTRAMUSCULAR | Status: AC
  Filled 2016-01-25: qty 500

## 2016-01-25 MED ORDER — PROPOFOL 10 MG/ML IV BOLUS
INTRAVENOUS | Status: DC | PRN
Start: 2016-01-25 — End: 2016-01-25
  Administered 2016-01-25: 30 mg via INTRAVENOUS
  Administered 2016-01-25 (×2): 20 mg via INTRAVENOUS
  Administered 2016-01-25: 10 mg via INTRAVENOUS

## 2016-01-25 MED ORDER — PHENYLEPHRINE HCL 10 MG/ML IJ SOLN
10.0000 mg | INTRAVENOUS | Status: DC | PRN
  Administered 2016-01-25: 15 ug/min via INTRAVENOUS

## 2016-01-25 MED ORDER — ADENOSINE 6 MG/2ML IV SOLN
INTRAVENOUS | Status: AC
  Filled 2016-01-25: qty 2

## 2016-01-25 MED ORDER — SODIUM CHLORIDE 0.9% FLUSH: 3.0000 mL | INTRAVENOUS | Status: DC | PRN

## 2016-01-25 MED ORDER — HEPARIN SODIUM (PORCINE) 1000 UNIT/ML IJ SOLN
INTRAMUSCULAR | Status: AC
  Filled 2016-01-25: qty 1

## 2016-01-25 MED ORDER — SODIUM CHLORIDE 0.9 % IV SOLN
INTRAVENOUS | Status: DC | PRN
  Administered 2016-01-25: 10:00:00 via INTRAVENOUS

## 2016-01-25 MED ORDER — SODIUM CHLORIDE 0.9 % IV SOLN: 250.0000 mL | INTRAVENOUS | Status: DC | PRN

## 2016-01-25 MED ORDER — PROPOFOL 500 MG/50ML IV EMUL
INTRAVENOUS | Status: DC | PRN
  Administered 2016-01-25: 50 ug/kg/min via INTRAVENOUS

## 2016-01-25 MED ORDER — ONDANSETRON HCL 4 MG/2ML IJ SOLN: 4.0000 mg | Freq: Four times a day (QID) | INTRAMUSCULAR | Status: DC | PRN

## 2016-01-25 MED ORDER — HEPARIN SODIUM (PORCINE) 1000 UNIT/ML IJ SOLN
INTRAMUSCULAR | Status: DC | PRN
  Administered 2016-01-25: 1000 [IU] via INTRAVENOUS

## 2016-01-25 MED ORDER — NITROGLYCERIN 0.3 MG SL SUBL
0.3000 mg | SUBLINGUAL_TABLET | SUBLINGUAL | Status: DC | PRN
  Filled 2016-01-25: qty 100

## 2016-01-25 MED ORDER — APIXABAN 5 MG PO TABS
5.0000 mg | ORAL_TABLET | Freq: Two times a day (BID) | ORAL | Status: DC
Start: 2016-01-25 — End: 2016-01-26
  Administered 2016-01-25 – 2016-01-26 (×2): 5 mg via ORAL
  Filled 2016-01-25 (×2): qty 1

## 2016-01-25 MED ORDER — PHENYLEPHRINE HCL 10 MG/ML IJ SOLN
INTRAMUSCULAR | Status: DC | PRN
  Administered 2016-01-25: 80 ug via INTRAVENOUS

## 2016-01-25 MED ORDER — ONDANSETRON HCL 4 MG/2ML IJ SOLN
INTRAMUSCULAR | Status: DC | PRN
  Administered 2016-01-25: 4 mg via INTRAVENOUS

## 2016-01-25 MED ORDER — ACETAMINOPHEN 325 MG PO TABS
650.0000 mg | ORAL_TABLET | ORAL | Status: DC | PRN
  Administered 2016-01-25 – 2016-01-26 (×2): 650 mg via ORAL
  Filled 2016-01-25 (×2): qty 2

## 2016-01-25 MED ORDER — ADENOSINE 6 MG/2ML IV SOLN
INTRAVENOUS | Status: DC | PRN
  Administered 2016-01-25 (×2): 12 mg via INTRAVENOUS

## 2016-01-25 MED ORDER — FENTANYL CITRATE (PF) 100 MCG/2ML IJ SOLN
INTRAMUSCULAR | Status: DC | PRN
  Administered 2016-01-25 (×4): 50 ug via INTRAVENOUS

## 2016-01-25 MED ORDER — LACTATED RINGERS IV SOLN
INTRAVENOUS | Status: DC | PRN
  Administered 2016-01-25: 08:00:00 via INTRAVENOUS

## 2016-01-25 MED ORDER — MIDAZOLAM HCL 5 MG/5ML IJ SOLN
INTRAMUSCULAR | Status: DC | PRN
  Administered 2016-01-25: 2 mg via INTRAVENOUS

## 2016-01-25 MED ORDER — HEPARIN SODIUM (PORCINE) 1000 UNIT/ML IJ SOLN
INTRAMUSCULAR | Status: DC | PRN
  Administered 2016-01-25: 12000 [IU] via INTRAVENOUS
  Administered 2016-01-25: 4000 [IU] via INTRAVENOUS

## 2016-01-25 MED ORDER — NITROGLYCERIN 0.4 MG SL SUBL: 0.4000 mg | SUBLINGUAL_TABLET | SUBLINGUAL | Status: DC | PRN

## 2016-01-25 MED ORDER — SODIUM CHLORIDE 0.9% FLUSH
3.0000 mL | Freq: Two times a day (BID) | INTRAVENOUS | Status: DC
  Administered 2016-01-25: 3 mL via INTRAVENOUS

## 2016-01-25 MED ORDER — PROTAMINE SULFATE 10 MG/ML IV SOLN
INTRAVENOUS | Status: DC | PRN
  Administered 2016-01-25: 40 mg via INTRAVENOUS

## 2016-01-25 MED ORDER — HEPARIN (PORCINE) IN NACL 2-0.9 UNIT/ML-% IJ SOLN
INTRAMUSCULAR | Status: DC | PRN
  Administered 2016-01-25: 08:00:00

## 2016-01-25 MED ORDER — ATORVASTATIN CALCIUM 40 MG PO TABS
40.0000 mg | ORAL_TABLET | Freq: Every day | ORAL | Status: DC
  Administered 2016-01-25: 40 mg via ORAL
  Filled 2016-01-25: qty 1

## 2016-01-25 MED ORDER — METOPROLOL TARTRATE 50 MG PO TABS
50.0000 mg | ORAL_TABLET | Freq: Two times a day (BID) | ORAL | Status: DC
Start: 2016-01-25 — End: 2016-01-26
  Administered 2016-01-25 – 2016-01-26 (×3): 50 mg via ORAL
  Filled 2016-01-25 (×3): qty 1

## 2016-01-25 MED ORDER — PANTOPRAZOLE SODIUM 40 MG PO TBEC
80.0000 mg | DELAYED_RELEASE_TABLET | Freq: Every day | ORAL | Status: DC
  Administered 2016-01-25: 80 mg via ORAL
  Filled 2016-01-25: qty 2

## 2016-01-25 MED ORDER — BUPIVACAINE HCL (PF) 0.25 % IJ SOLN
INTRAMUSCULAR | Status: AC
  Filled 2016-01-25: qty 60

## 2016-01-25 MED ORDER — BUPIVACAINE HCL (PF) 0.25 % IJ SOLN
INTRAMUSCULAR | Status: DC | PRN
  Administered 2016-01-25: 31 mL

## 2016-01-25 SURGICAL SUPPLY — 22 items
BAG SNAP BAND KOVER 36X36 (MISCELLANEOUS) ×3 IMPLANT
BLANKET WARM UNDERBOD FULL ACC (MISCELLANEOUS) ×3 IMPLANT
CATH NAVISTAR SMARTTOUCH DF (ABLATOR) ×3 IMPLANT
CATH SOUNDSTAR ECO REPROCESSED (CATHETERS) ×2 IMPLANT
CATH VARIABLE LASSO NAV 2515 (CATHETERS) ×2 IMPLANT
CATH WEBSTER BI DIR CS D-F CRV (CATHETERS) ×3 IMPLANT
COVER SWIFTLINK CONNECTOR (BAG) ×3 IMPLANT
LINE ARTERIAL PRESSURE 36 MF (TUBING) ×4 IMPLANT
NDL TRANSSEPTAL BRK 98CM (NEEDLE) IMPLANT
NEEDLE TRANSSEPTAL BRK 98CM (NEEDLE) ×3 IMPLANT
PACK EP LATEX FREE (CUSTOM PROCEDURE TRAY) ×3
PACK EP LF (CUSTOM PROCEDURE TRAY) ×1 IMPLANT
PAD DEFIB LIFELINK (PAD) ×3 IMPLANT
PATCH CARTO3 (PAD) ×3 IMPLANT
SHEATH AGILIS NXT 8.5F 71CM (SHEATH) ×4 IMPLANT
SHEATH AVANTI 11F 11CM (SHEATH) ×3 IMPLANT
SHEATH PINNACLE 7F 10CM (SHEATH) ×3 IMPLANT
SHEATH PINNACLE 8F 10CM (SHEATH) ×5 IMPLANT
SHEATH PINNACLE 9F 10CM (SHEATH) ×5 IMPLANT
SHIELD RADPAD SCOOP 12X17 (MISCELLANEOUS) ×3 IMPLANT
TRANSDUCER W/STOPCOCK (MISCELLANEOUS) ×4 IMPLANT
TUBING SMART ABLATE COOLFLOW (TUBING) ×3 IMPLANT

## 2016-01-25 NOTE — Transfer of Care (Signed)
Immediate Anesthesia Transfer of Care Note  Patient: Mark [name]  Procedure(s) Performed: Procedure(s): Atrial Fibrillation Ablation (N/A)  Patient Location: PACU and Cath Lab  Anesthesia Type:MAC  Level of Consciousness: awake, alert , oriented and sedated  Airway & Oxygen Therapy: Patient Spontanous Breathing and Patient connected to face mask oxygen  Post-op Assessment: Report given to RN, Post -op Vital signs reviewed and stable and Patient moving all extremities  Post vital signs: Reviewed and stable  Last Vitals:  Filed Vitals:   01/25/16 0538  BP: 131/91  Pulse: 72  Temp: 36.6 C  Resp: 18    Complications: No apparent anesthesia complications

## 2016-01-25 NOTE — Discharge Instructions (Signed)
No driving for 4 days. No lifting over 5 lbs for 1 week. No sexual activity for 1 week. You may return to work in 1 week. Keep procedure site clean & dry. If you notice increased pain, swelling, bleeding or pus, call/return!  You may shower, but no soaking baths/hot tubs/pools for 1 week.  ° ° °You have an appointment set up with the Atrial Fibrillation Clinic.  Multiple studies have shown that being followed by a dedicated atrial fibrillation clinic in addition to the standard care you receive from your other physicians improves health. We believe that enrollment in the atrial fibrillation clinic will allow us to better care for you.  ° °The phone number to the Atrial Fibrillation Clinic is [phone]. The clinic is staffed Monday through Friday from 8:30am to 5pm. ° °Parking Directions: The clinic is located in the Heart and Vascular Building connected to [facility]. °1)From [address] turn on to [address] and go to the 3rd entrance  (Heart and Vascular entrance) on the right. °2)Look to the right for Heart &Vascular Parking Garage. °3)A code for the entrance is required please call the clinic to receive this.   °4)Take the elevators to the 1st floor. Registration is in the room with the glass walls at the end of the hallway. ° °If you have any trouble parking or locating the clinic, please don’t hesitate to call [phone]. ° ° °

## 2016-01-25 NOTE — Progress Notes (Signed)
ELECTROPHYSIOLOGY PROCEDURE DISCHARGE SUMMARY    Patient ID: Mark [name],  MRN: [id number], DOB/AGE: 56-04-1961 56 y.o.  Admit date: 01/25/2016 Discharge date: 01/26/2016  Primary Care Physician: Georgianne Fick, MD Primary Cardiologist: [place] Electrophysiologist: Elberta Fortis  Primary Discharge Diagnosis:  Persistent atrial fibrillation s/p ablation this admission  Secondary Discharge Diagnosis:  1.  CAD 2.  Hypertension 3.  Hyperlipidemia  Procedures This Admission:  1.  Electrophysiology study and radiofrequency catheter ablation on 01/25/16 by Dr Elberta Fortis.  This study demonstrated sinus rhythm upon presentation; successful electrical isolation and anatomical encircling of all four pulmonary veins with radiofrequency current; no inducible arrhythmias following ablation; no early apparent complications.    Brief HPI: Mark [name] is a 55 y.o. male with a history of persistent atrial fibrillation and AVNRT.  They have failed medical therapy with Diltiazem. Risks, benefits, and alternatives to catheter ablation of atrial fibrillation were reviewed with the patient who wished to proceed.  The patient underwent cardiac CT prior to the procedure which demonstrated no LAA thrombus.    Hospital Course:  The patient was admitted and underwent EPS/RFCA of atrial fibrillation with details as outlined above.  They were monitored on telemetry overnight which demonstrated sinus rhythm.  Groin was without complication on the day of discharge.  The patient was examined and considered to be stable for discharge.  Wound care and restrictions were reviewed with the patient.  The patient Mark be seen back by Rudi Coco, NP in 4 weeks and Dr Elberta Fortis in 12 weeks for post ablation follow up.   This patients CHA2DS2-VASc Score and unadjusted Ischemic Stroke Rate (% per year) is equal to 2.2 % stroke rate/year from a score of 2 Above score calculated as 1 point each if present [CHF, HTN, DM,  Vascular=MI/PAD/Aortic Plaque, Age if 65-74, or Male] Above score calculated as 2 points each if present [Age > 75, or Stroke/TIA/TE]   Physical Exam: Filed Vitals:   01/25/16 2000 01/25/16 2109 01/25/16 2329 01/26/16 0348  BP: 119/88 109/78 103/70 102/66  Pulse: 91     Temp:   98.4 F (36.9 C) 97.8 F (36.6 C)  TempSrc:   Oral Oral  Resp: Height:      Weight:    162 lb 14.7 oz (73.9 kg)  SpO2: 95%  97% 98%    GEN- The patient is well appearing, alert and oriented x 3 today.   HEENT: normocephalic, atraumatic; sclera clear, conjunctiva pink; hearing intact; oropharynx clear; neck supple  Lungs- Clear to ausculation bilaterally, normal work of breathing.  No wheezes, rales, rhonchi Heart- Regular rate and rhythm  GI- soft, non-tender, non-distended, bowel sounds present  Extremities- no clubbing, cyanosis, or edema; DP/PT/radial pulses 2+ bilaterally, groin without hematoma/bruit MS- no significant deformity or atrophy Skin- warm and dry, no rash or lesion Psych- euthymic mood, full affect Neuro- strength and sensation are intact   Labs:   Lab Results  Component Value Date   WBC 5.8 01/16/2016   HGB 16.0 01/16/2016   HCT 47.4 01/16/2016   MCV 88.6 01/16/2016   PLT 221 01/16/2016   No results for input(s): NA, K, CL, CO2, BUN, CREATININE, CALCIUM, PROT, BILITOT, ALKPHOS, ALT, AST, GLUCOSE in the last 168 hours.  Invalid input(s): LABALBU   Discharge Medications:    Medication List    TAKE these medications        apixaban 5 MG Tabs tablet  Commonly known as:  ELIQUIS  Take 1 tablet (5 mg  total) by mouth 2 (two) times daily.     atorvastatin 80 MG tablet  Commonly known as:  LIPITOR  Take 0.5 tablets (40 mg total) by mouth daily.     diltiazem 120 MG 24 hr capsule  Commonly known as:  CARDIZEM CD  Take 1 capsule (120 mg total) by mouth daily.     metoprolol 50 MG tablet  Commonly known as:  LOPRESSOR  Take 1 tablet (50 mg total) by mouth 2  (two) times daily.     NITROSTAT 0.4 MG SL tablet  Generic drug:  nitroGLYCERIN  TAKE 1 TABLET BY MOUTH EVERY 5 MINS AN FOR UP TO 3 DOSES     omeprazole 40 MG capsule  Commonly known as:  PRILOSEC  Take 40 mg by mouth 2 (two) times daily.        Disposition:  Discharge Instructions    Diet - low sodium heart healthy    Complete by:  As directed      Increase activity slowly    Complete by:  As directed           Follow-up Information    Follow up with [facility] On 02/23/2016.   Specialty:  Cardiology   Why:  at [facility] information:   [address] [id number] Mark [name] [id number] [phone]      Follow up with Mark [name], MD On 04/30/2016.   Specialty:  Cardiology   Why:  at 9:45AM    Contact information:   [address] [place] [address] [phone]       Duration of Discharge Encounter: Greater than 30 minutes including physician time.  Signed, Gypsy Balsam, NP 01/26/2016 7:36 AM  I have seen and examined this patient with Gypsy Balsam.  Agree with above, note added to reflect my findings.  On exam, regular rhythm, no murmurs, lungs clear.  Had ablation done for atrial fibrillation.  Went into AF during the procedure and required external cardioversion.  Plan for discharge today with follow up in EP clinic in 4 weeks.    Mark M. Camnitz MD 01/26/2016 12:40 PM

## 2016-01-25 NOTE — Progress Notes (Signed)
Bilateral sheath removal post procedure. Manual compression applied to right groin for 15 minutes by Jenelle Mages, RN. 9Fr. Sheath removed intact without complication. Right groin stable with no oozing, bruising or hematoma present. Bp: 116/75, HR: 93, SP02 97%. Sterile dressing appl;ied to right groin. Site dry and intact.  Bilateral Sheaths removed without complication 9Fr. and 11Fr. Removed intact. Manual compression applied for 20 minutes. Left groin stable with no oozing, brusing or hematoma present. Bp: 109 / 76, HR: 85, SP02: 98. Steriel dressing applied to site. Dressing dry and intact.

## 2016-01-25 NOTE — Anesthesia Preprocedure Evaluation (Addendum)
Anesthesia Evaluation  Patient identified by MRN, date of birth, ID band Patient awake    Reviewed: Allergy & Precautions, NPO status , Patient's Chart, lab work & pertinent test results  History of Anesthesia Complications Negative for: history of anesthetic complications  Airway Mallampati: II  TM Distance: >3 FB Neck ROM: Full    Dental  (+) Teeth Intact   Pulmonary neg shortness of breath, neg sleep apnea, neg COPD, neg recent URI, former smoker, neg PE   breath sounds clear to auscultation       Cardiovascular hypertension, Pt. on medications and Pt. on home beta blockers (-) angina+ CAD, + Past MI and + Cardiac Stents   Rhythm:Irregular  Left ventricle: The cavity size was normal. Systolic function was normal. The estimated ejection fraction was in the range of 55% to 60%. Wall motion was normal; there were no regional wall motion abnormalities. Left ventricular diastolic function parameters were normal.   Neuro/Psych negative neurological ROS  negative psych ROS   GI/Hepatic negative GI ROS, Neg liver ROS,   Endo/Other  negative endocrine ROS  Renal/GU negative Renal ROS     Musculoskeletal   Abdominal   Peds  Hematology negative hematology ROS (+)   Anesthesia Other Findings 2012 MI s/p BMS x 2, on antiplatelet therapy, denies chest pain  Reproductive/Obstetrics                            Anesthesia Physical Anesthesia Plan  ASA: III  Anesthesia Plan: MAC   Post-op Pain Management:    Induction: Intravenous  Airway Management Planned: Natural Airway, Nasal Cannula and Simple Face Mask  Additional Equipment: None  Intra-op Plan:   Post-operative Plan:   Informed Consent: I have reviewed the patients History and Physical, chart, labs and discussed the procedure including the risks, benefits and alternatives for the proposed anesthesia with the patient or  authorized representative who has indicated his/her understanding and acceptance.   Dental advisory given  Plan Discussed with: CRNA and Surgeon  Anesthesia Plan Comments:         Anesthesia Quick Evaluation

## 2016-01-25 NOTE — Progress Notes (Signed)
Pt arrived to 2H25 NAD VSS BL groin sites soft with no s/s of hematoma.

## 2016-01-25 NOTE — H&P (Signed)
Primary Care Physician: Georgianne Fick, MD Referring Physician: Dr. Elisabeth Cara is a 55 y.o. male with a h/o afib, CAD, HLD, tobacco abuse,ablation in 2008 for possible PVC's by Dr. Ladona Ridgel that was seen by Dr. Elberta Fortis 11/30 to be considered for ablation. 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 Ablation procedure was discussed with the pt but he was undecided. Recently called back to the office and wanted to schedule. He is being seen as part of the rescheduling process. He was consented by Dr. Elberta Fortis and procedural information was reviewed with the pt. He is having chest CT tomorrow as part of the pre procedure work up with the ablation scheduled for 2/9. Labs Asako Saliba be drawn today.   Today, he denies symptoms of palpitations, chest pain, shortness of breath, orthopnea, PND, lower extremity edema, dizziness, presyncope, syncope, or neurologic sequela. The patient is tolerating medications without difficulties and is otherwise without complaint today.   Past Medical History  Diagnosis Date  . CAD (coronary artery disease)   . Hyperlipidemia   . Hypertension   . Tobacco abuse   . SVT (supraventricular tachycardia) (HCC)   . STEMI (ST elevation myocardial infarction) (HCC) 04/23/11  . S/P coronary artery stent placement 04/23/11    BMS to the 3rd OM; has residual disease  . Atrial fibrillation [facility])    Past Surgical History  Procedure Laterality Date  . Svt ablation   2008    Dr. Ladona Ridgel  . Coronary stent placement  04/23/11    BMS to the 3rd OM  . Left heart catheterization with coronary angiogram N/A 04/13/2014    Procedure: LEFT HEART CATHETERIZATION WITH CORONARY ANGIOGRAM; Surgeon: Peter M [place], MD; Location: [facility] CATH LAB; Service: Cardiovascular; Laterality: N/A;  . Percutaneous coronary stent intervention (pci-s) Right 04/13/2014    Procedure: PERCUTANEOUS CORONARY STENT  INTERVENTION (PCI-S); Surgeon: Peter M [place], MD; Location: [facility] CATH LAB; Service: Cardiovascular; Laterality: Right;    Current Outpatient Prescriptions  Medication Sig Dispense Refill  . apixaban (ELIQUIS) 5 MG TABS tablet Take 1 tablet (5 mg total) by mouth 2 (two) times daily. 60 tablet 11  . atorvastatin (LIPITOR) 80 MG tablet Take 0.5 tablets (40 mg total) by mouth daily. 45 tablet 3  . diltiazem (CARDIZEM CD) 120 MG 24 hr capsule Take 1 capsule (120 mg total) by mouth daily. 90 capsule 2  . metoprolol tartrate (LOPRESSOR) 50 MG tablet Take 1 tablet (50 mg total) by mouth 2 (two) times daily. 180 tablet 1  . omeprazole (PRILOSEC) 20 MG capsule Take 40 mg by mouth 2 (two) times daily.     Marland Kitchen NITROSTAT 0.4 MG SL tablet TAKE 1 TABLET BY MOUTH EVERY 5 MINS AN FOR UP TO 3 DOSES (Patient not taking: Reported on 01/16/2016) 25 tablet 6   No current facility-administered medications for this encounter.    Allergies  Allergen Reactions  . Penicillins     Passed out and got weak    Social History   Social History  . Marital Status: Married    Spouse Name: N/A  . Number of Children: 2  . Years of Education: N/A   Occupational History  . [employer]    Social History Main Topics  . Smoking status: Former Smoker    Quit date: 04/23/2011  . Smokeless tobacco: Former Neurosurgeon    Quit date: 04/23/2011  . Alcohol Use: No  . Drug Use: No  . Sexual Activity: Yes   Other Topics Concern  .  Not on file   Social History Narrative    Family History  Problem Relation Age of Onset  . Hypertension Mother     ROS- All systems are reviewed and negative except as per the HPI above  Physical Exam: Filed Vitals:   01/16/16 1331  BP: 122/86  Pulse: 76  Height:  (1.702 m)  Weight: 161 lb 6.4 oz (73.211 kg)    GEN- The patient is well appearing, alert  and oriented x 3 today.  Head- normocephalic, atraumatic Eyes- Sclera clear, conjunctiva pink Ears- hearing intact Oropharynx- clear Neck- supple, no JVP Lymph- no cervical lymphadenopathy Lungs- Clear to ausculation bilaterally, normal work of breathing Heart- Regular rate and rhythm, no murmurs, rubs or gallops, PMI not laterally displaced GI- soft, NT, ND, + BS Extremities- no clubbing, cyanosis, or edema MS- no significant deformity or atrophy Skin- no rash or lesion Psych- euthymic mood, full affect Neuro- strength and sensation are intact  EKG- NSR normal EKG, pr int 124 ms, qrs int 90 ms, qtc 423 ms.  Epic records reviewed  Assessment and Plan: 1. 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 Elberta Fortis 11/30, reviewed with pt and he wants to proceed Labs drawn today CT of chest pending tomorrow Pre procedure instruction sheet copied and given to pt  2.HTN Stable  3. CAD Stable  F/u with afib clinic post ablation as scheduled 3/10  Lupita Leash C. Matthew Folks [facility] [address] [place], [place] [address] [phone]       Patient presents today for ablation of AF.  Regular rhythm, no murmurs, lungs clear.  Also had narrow complex tachycardia on monitor which Jamiyah Dingley be evaluated.  Discussed risks and benefits of the procedure.  Risks include bleeding, infection, tamponade, stroke, heart block.  Patient understands these risks and wishes to proceed with ablation.  Shizuo Biskup plan on ablation today and follow up in AF clinic.

## 2016-01-26 ENCOUNTER — Telehealth: Payer: Self-pay

## 2016-01-26 DIAGNOSIS — I2510 Atherosclerotic heart disease of native coronary artery without angina pectoris: Secondary | ICD-10-CM | POA: Diagnosis not present

## 2016-01-26 DIAGNOSIS — I480 Paroxysmal atrial fibrillation: Secondary | ICD-10-CM | POA: Diagnosis not present

## 2016-01-26 DIAGNOSIS — E785 Hyperlipidemia, unspecified: Secondary | ICD-10-CM | POA: Diagnosis not present

## 2016-01-26 DIAGNOSIS — I481 Persistent atrial fibrillation: Secondary | ICD-10-CM | POA: Diagnosis not present

## 2016-01-26 NOTE — Telephone Encounter (Signed)
Patient called spoke to wife.I received a message from Dr.Jordan ok to cancel appointment with him 2/15 and reschedule in 3 months since he will be followed in AFib clinic.Wife stated husband is doing good since his AFib ablation 2/9.Stated she already called and rescheduled appointment with Dr.Jordan this morning.He has appointment in AFib Clinic 02/23/16 and appointment with Dr.Jordan 05/17/16.Advised to call sooner if needed.

## 2016-01-28 NOTE — Anesthesia Postprocedure Evaluation (Signed)
Anesthesia Post Note  Patient: Mark [name]  Procedure(s) Performed: Procedure(s) (LRB): Atrial Fibrillation Ablation (N/A)  Patient location during evaluation: PACU Anesthesia Type: MAC Level of consciousness: awake Pain management: pain level controlled Vital Signs Assessment: post-procedure vital signs reviewed and stable Respiratory status: spontaneous breathing Cardiovascular status: stable Postop Assessment: no signs of nausea or vomiting Anesthetic complications: no    Last Vitals:  Filed Vitals:   01/26/16 0736 01/26/16 0907  BP: 113/75 113/78  Pulse: 87 88  Temp: 36.6 C   Resp: 13     Last Pain:  Filed Vitals:   01/26/16 0907  PainSc: 0-No pain                 Almando Brawley

## 2016-01-30 ENCOUNTER — Encounter (HOSPITAL_COMMUNITY): Payer: Self-pay | Admitting: Cardiology

## 2016-01-31 ENCOUNTER — Ambulatory Visit: Payer: Federal, State, Local not specified - PPO | Admitting: Cardiology

## 2016-02-23 ENCOUNTER — Ambulatory Visit (HOSPITAL_COMMUNITY)
Admission: RE | Admit: 2016-02-23 | Discharge: 2016-02-23 | Disposition: A | Discharge status: Home / Self Care | Payer: Federal, State, Local not specified - PPO | Source: Ambulatory Visit | Attending: Nurse Practitioner | Admitting: Nurse Practitioner

## 2016-02-23 ENCOUNTER — Encounter (HOSPITAL_COMMUNITY): Payer: Self-pay | Admitting: Nurse Practitioner

## 2016-02-23 VITALS — BP 122/82 | HR 69 | Ht 67.0 in | Wt 162.4 lb

## 2016-02-23 DIAGNOSIS — I480 Paroxysmal atrial fibrillation: Secondary | ICD-10-CM

## 2016-02-23 DIAGNOSIS — I4891 Unspecified atrial fibrillation: Secondary | ICD-10-CM | POA: Diagnosis present

## 2016-02-23 NOTE — Progress Notes (Signed)
Patient ID: Mark [name], male   DOB: December 07, 1961, 55 y.o.   MRN: [id number]     Primary Care Physician: Georgianne Fick, MD Referring Physician: Dr. Alyce Pagan Mark [name] is a 55 y.o. male with a h/o afib that underwent afib ablation 2/9. He denies any complications with the procedure, no rt groin pain or swallowing difficulties. He continues with apixaban, no bleeding issues. He has not noticed any afib at all,he feels well.  Today, he denies symptoms of palpitations, chest pain, shortness of breath, orthopnea, PND, lower extremity edema, dizziness, presyncope, syncope, or neurologic sequela. The patient is tolerating medications without difficulties and is otherwise without complaint today.   Past Medical History  Diagnosis Date  . CAD (coronary artery disease)   . Hyperlipidemia   . Hypertension   . Tobacco abuse   . SVT (supraventricular tachycardia) (HCC)   . STEMI (ST elevation myocardial infarction) (HCC) 04/23/11  . S/P coronary artery stent placement 04/23/11    BMS to the 3rd OM; has residual disease  . Atrial fibrillation [facility])    Past Surgical History  Procedure Laterality Date  . Svt ablation   2008    Dr. Ladona Ridgel  . Coronary stent placement  04/23/11    BMS to the 3rd OM  . Left heart catheterization with coronary angiogram N/A 04/13/2014    Procedure: LEFT HEART CATHETERIZATION WITH CORONARY ANGIOGRAM;  Surgeon: Peter M [place], MD;  Location: [facility] CATH LAB;  Service: Cardiovascular;  Laterality: N/A;  . Percutaneous coronary stent intervention (pci-s) Right 04/13/2014    Procedure: PERCUTANEOUS CORONARY STENT INTERVENTION (PCI-S);  Surgeon: Peter M [place]azil[place], MD;  Location: [facility] CATH LAB;  Service: Cardiovascular;  Laterality: Right;  . Electrophysiologic study N/A 01/25/2016    Procedure: Atrial Fibrillation Ablation;  Surgeon: Will [name], MD;  Location: [facility] INVASIVE CV LAB;  Service: Cardiovascular;  Laterality: N/A;    Current Outpatient Prescriptions  Medication  Sig Dispense Refill  . apixaban (ELIQUIS) 5 MG TABS tablet Take 1 tablet (5 mg total) by mouth 2 (two) times daily. 60 tablet 11  . atorvastatin (LIPITOR) 80 MG tablet Take 0.5 tablets (40 mg total) by mouth daily. 45 tablet 3  . diltiazem (CARDIZEM CD) 120 MG 24 hr capsule Take 1 capsule (120 mg total) by mouth daily. 90 capsule 2  . metoprolol tartrate (LOPRESSOR) 50 MG tablet Take 1 tablet (50 mg total) by mouth 2 (two) times daily. 180 tablet 1  . NITROSTAT 0.4 MG SL tablet TAKE 1 TABLET BY MOUTH EVERY 5 MINS AN FOR UP TO 3 DOSES 25 tablet 6  . omeprazole (PRILOSEC) 40 MG capsule Take 40 mg by mouth 2 (two) times daily.  6   No current facility-administered medications for this encounter.    Allergies  Allergen Reactions  . Penicillins     Passed out and got weak  Has patient had a PCN reaction causing immediate rash, facial/tongue/throat swelling, SOB or lightheadedness with hypotension: Yes Has patient had a PCN reaction causing severe rash involving mucus membranes or skin necrosis: No Has patient had a PCN reaction that required hospitalization No Has patient had a PCN reaction occurring within the last 10 years: No If all of the above answers are "NO", then may proceed with Cephalosporin use.    Social History   Social History  . Marital Status: Married    Spouse Name: N/A  . Number of Children: 2  . Years of Education: N/A   Occupational History  . [employer]    Social History Main Topics  . Smoking status: Former Smoker    Quit date: 04/23/2011  . Smokeless tobacco: Former NeurosurgeonUser    Quit date: 04/23/2011  . Alcohol Use: No  . Drug Use: No  . Sexual Activity: Yes   Other Topics Concern  . Not on file   Social History Narrative    Family History  Problem Relation Age of Onset  . Hypertension Mother     ROS- All systems are reviewed and negative except as per the HPI above  Physical Exam: Filed Vitals:   02/23/16 1001  BP: 122/82  Pulse: 69  Height:  5\' 7"  (1.702 m)  Weight: 162 lb 6.4 oz (73.664 kg)    GEN- The patient is well appearing, alert and oriented x 3 today.   Head- normocephalic, atraumatic Eyes-  Sclera clear, conjunctiva pink Ears- hearing intact Oropharynx- clear Neck- supple, no JVP Lymph- no cervical lymphadenopathy Lungs- Clear to ausculation bilaterally, normal work of breathing Heart- Regular rate and rhythm, no murmurs, rubs or gallops, PMI not laterally displaced GI- soft, NT, ND, + BS Extremities- no clubbing, cyanosis, or edema MS- no significant deformity or atrophy Skin- no rash or lesion Psych- euthymic mood, full affect Neuro- strength and sensation are intact  EKG- NSR, v rate 69 bpm, pr int 126 ms, qrs int 94 bpm, qtc 377 ms Epic records reviewed  Assessment and Plan: 1. Afib S/p ablation and is doing well No afib occurrence Is back to his normal routine Continue apixaban Continue diltiazem/metoprolol  F/u with Dr. Elberta Fortisamnitz 5/16 F/u with Dr. [place] 6/2  Elvina Sidleonna C. Matthew Folksarroll, ANP-C Afib Clinic [facility] [address] [place], [place] [address] [phone]

## 2016-04-29 NOTE — Progress Notes (Signed)
Electrophysiology Office Note   Date:  04/30/2016   ID:  Mark [name], DOB 1961-05-30, MRN [id number]  PCP:  Mark Fick, MD  Cardiologist:  Mark [name] Primary Electrophysiologist:  Mark Lemming, MD    Chief Complaint  Patient presents with  . Follow-up    post afib ablation     History of Present Illness: Mark [name] is a 55 y.o. male who presents today for electrophysiology evaluation.    He has a history of atrial fibrillation, coronary artery disease , hyperlipidemia, hypertension, ablation in 2008 for possible PVCs by Dr. Ladona [name] , tobacco abuse. He had a STEMI in May 2012 was treated with a bare metal stent to the third OM. He  Had a cardiac cath in 2015 which showed severe stenosis of the proximal LAD for which he received a bare metal stent. In December 2015 he was admitted with new-onset atrial fibrillation. He converted spontaneously. Echo at that time was normal. He is placed on Eliquis and Plavix aspirin was stopped. He was started on diltiazem at that time.  He had AF ablation on 01/25/16.  At that time, and EP study was done and no other arrhythmia was found.Since that time, he is felt well. He has plenty of energy, and has not had any further palpitations.   Today, he denies symptoms of chest pain, shortness of breath, orthopnea, PND, lower extremity edema, claudication, dizziness, presyncope, syncope, bleeding, or neurologic sequela. The patient is tolerating medications without difficulties and is otherwise without complaint today.    Past Medical History  Diagnosis Date  . CAD (coronary artery disease)   . Hyperlipidemia   . Hypertension   . Tobacco abuse   . SVT (supraventricular tachycardia) (HCC)   . STEMI (ST elevation myocardial infarction) (HCC) 04/23/11  . S/P coronary artery stent placement 04/23/11    BMS to the 3rd OM; has residual disease  . Atrial fibrillation [facility])    Past Surgical History  Procedure Laterality Date  . Svt ablation    2008    Dr. Ladona [name]  . Coronary stent placement  04/23/11    BMS to the 3rd OM  . Left heart catheterization with coronary angiogram N/A 04/13/2014    Procedure: LEFT HEART CATHETERIZATION WITH CORONARY ANGIOGRAM;  Surgeon: Mark M Swaziland, MD;  Location: [facility] CATH LAB;  Service: Cardiovascular;  Laterality: N/A;  . Percutaneous coronary stent intervention (pci-s) Right 04/13/2014    Procedure: PERCUTANEOUS CORONARY STENT INTERVENTION (PCI-S);  Surgeon: Mark M Swaziland, MD;  Location: [facility] CATH LAB;  Service: Cardiovascular;  Laterality: Right;  . Electrophysiologic study N/A 01/25/2016    Procedure: Atrial Fibrillation Ablation;  Surgeon: Mark Heinrich [name], MD;  Location: [facility] INVASIVE CV LAB;  Service: Cardiovascular;  Laterality: N/A;     Current Outpatient Prescriptions  Medication Sig Dispense Refill  . apixaban (ELIQUIS) 5 MG TABS tablet Take 1 tablet (5 mg total) by mouth 2 (two) times daily. 60 tablet 11  . atorvastatin (LIPITOR) 80 MG tablet Take 0.5 tablets (40 mg total) by mouth daily. 45 tablet 3  . diltiazem (CARDIZEM CD) 120 MG 24 hr capsule Take 1 capsule (120 mg total) by mouth daily. 90 capsule 2  . metoprolol tartrate (LOPRESSOR) 50 MG tablet Take 1 tablet (50 mg total) by mouth 2 (two) times daily. 180 tablet 1  . NITROSTAT 0.4 MG SL tablet TAKE 1 TABLET BY MOUTH EVERY 5 MINS AN FOR UP TO 3 DOSES 25 tablet 6  . omeprazole (PRILOSEC) 40 MG capsule  Take 40 mg by mouth daily.   6   No current facility-administered medications for this visit.    Allergies:   Penicillins   Social History:  The patient  reports that he quit smoking about 5 years ago. He quit smokeless tobacco use about 5 years ago. He reports that he does not drink alcohol or use illicit drugs.   Family History:  The patient's family history includes Hypertension in his mother.    ROS:  Please see the history of present illness.   Otherwise, review of systems is positive for none.   All other systems are reviewed  and negative.    PHYSICAL EXAM: VS:  BP 122/88 mmHg  Pulse 79  Ht 5\' 7"  (1.702 m)  Wt 159 lb 3.2 oz (72.213 kg)  BMI 24.93 kg/m2 , BMI Body mass index is 24.93 kg/(m^2). GEN: Well nourished, well developed, in no acute distress HEENT: normal Neck: no JVD, carotid bruits, or masses Cardiac: RRR; no murmurs, rubs, or gallops,no edema  Respiratory:  clear to auscultation bilaterally, normal work of breathing GI: soft, nontender, nondistended, + BS MS: no deformity or atrophy Skin: warm and dry Neuro:  Strength and sensation are intact Psych: euthymic mood, full affect  EKG:  EKG  ordered today. The ekg ordered today shows  Sinus rhythm , rate 79  Recent Labs: 01/16/2016: BUN 14; Creatinine, Ser 0.84; Hemoglobin 16.0; Platelets 221; Potassium 4.7; Sodium 144    Lipid Panel     Component Value Date/Time   CHOL 137 03/24/2015 0825   TRIG 118 03/24/2015 0825   HDL 33* 03/24/2015 0825   CHOLHDL 4.2 03/24/2015 0825   VLDL 24 03/24/2015 0825   LDLCALC 80 03/24/2015 0825     Wt Readings from Last 3 Encounters:  04/30/16 159 lb 3.2 oz (72.213 kg)  02/23/16 162 lb 6.4 oz (73.664 kg)  01/26/16 162 lb 14.7 oz (73.9 kg)      Other studies Reviewed: Additional studies/ records that were reviewed today include: TTE 2015 Review of the above records today demonstrates:  - Left ventricle: The cavity size was normal. Systolic function was normal. The estimated ejection fraction was in the range of 55% to 60%. Wall motion was normal; there were no regional wall motion abnormalities. Left ventricular diastolic function parameters were normal.  ASSESSMENT AND PLAN:  1.  Atrial fibrillation:  Had ablation on 01/25/16 without issues.  He is feeling well currently. He is tolerating his anticoagulation well. At this time, [name] continue his Eliquis. Since he is feeling well we [name] schedule him for follow-up in one year.  This patients CHA2DS2-VASc Score and unadjusted Ischemic  Stroke Rate (% per year) is equal to 2.2 % stroke rate/year from a score of 2  Above score calculated as 1 point each if present [CHF, HTN, DM, Vascular=MI/PAD/Aortic Plaque, Age if 65-74, or Male] Above score calculated as 2 points each if present [Age > 75, or Stroke/TIA/TE]  2.  SVT:  Was seen on monitor.  EP study with general anesthseia was negavie. No further palpitations that would suggest SVT. We'll continue to monitor.     Current medicines are reviewed at length with the patient today.   The patient does not have concerns regarding his medicines.  The following changes were made today:  none  Labs/ tests ordered today include:  No orders of the defined types were placed in this encounter.     Disposition:   FU with Nyari Olsson 1 year  Signed, Otha Rickles [name], MD  04/30/2016 9:51 AM     [facility] [address] [place] [address] [phone] (office) [phone] (fax)

## 2016-04-30 ENCOUNTER — Ambulatory Visit (INDEPENDENT_AMBULATORY_CARE_PROVIDER_SITE_OTHER): Payer: Federal, State, Local not specified - PPO | Admitting: Cardiology

## 2016-04-30 ENCOUNTER — Encounter: Payer: Self-pay | Admitting: Cardiology

## 2016-04-30 VITALS — BP 122/88 | HR 79 | Ht 67.0 in | Wt 159.2 lb

## 2016-04-30 DIAGNOSIS — I480 Paroxysmal atrial fibrillation: Secondary | ICD-10-CM | POA: Diagnosis not present

## 2016-04-30 NOTE — Patient Instructions (Signed)
Medication Instructions:  Your physician recommends that you continue on your current medications as directed. Please refer to the Current Medication list given to you today.  Labwork: None ordered  Testing/Procedures: None ordered  Follow-Up: Your physician wants you to follow-up in: 1 year with Dr. Camnitz.  You will receive a reminder letter in the mail two months in advance. If you don't receive a letter, please call our office to schedule the follow-up appointment.  If you need a refill on your cardiac medications before your next appointment, please call your pharmacy.  Thank you for choosing [facility]!!   Charnette Younkin, RN [phone]      

## 2016-05-09 ENCOUNTER — Other Ambulatory Visit: Payer: Self-pay | Admitting: Cardiology

## 2016-05-10 DIAGNOSIS — Z86010 Personal history of colonic polyps: Secondary | ICD-10-CM | POA: Diagnosis not present

## 2016-05-10 DIAGNOSIS — K219 Gastro-esophageal reflux disease without esophagitis: Secondary | ICD-10-CM | POA: Diagnosis not present

## 2016-05-17 ENCOUNTER — Ambulatory Visit (INDEPENDENT_AMBULATORY_CARE_PROVIDER_SITE_OTHER): Payer: Federal, State, Local not specified - PPO | Admitting: Cardiology

## 2016-05-17 ENCOUNTER — Encounter: Payer: Self-pay | Admitting: Cardiology

## 2016-05-17 VITALS — BP 112/70 | HR 67 | Ht 67.0 in | Wt 161.0 lb

## 2016-05-17 DIAGNOSIS — I10 Essential (primary) hypertension: Secondary | ICD-10-CM | POA: Diagnosis not present

## 2016-05-17 DIAGNOSIS — I2510 Atherosclerotic heart disease of native coronary artery without angina pectoris: Secondary | ICD-10-CM

## 2016-05-17 DIAGNOSIS — I480 Paroxysmal atrial fibrillation: Secondary | ICD-10-CM | POA: Diagnosis not present

## 2016-05-17 DIAGNOSIS — E785 Hyperlipidemia, unspecified: Secondary | ICD-10-CM

## 2016-05-17 MED ORDER — ATORVASTATIN CALCIUM 80 MG PO TABS: 40.0000 mg | ORAL_TABLET | Freq: Every day | ORAL | Status: DC

## 2016-05-17 NOTE — Patient Instructions (Signed)
Continue your current therapy  I will see you in 6 months.   

## 2016-05-17 NOTE — Progress Notes (Signed)
Mark [name] Date of Birth: January 31, 1961 Medical Record #[id number]  History of Present Illness: Mark [name] is seen for follow up Atrial fibrillation and CAD. He has HLD, CAD, HTN, SVT with remote ablation in 2008 per Dr. Ladona Ridgel and prior history of tobacco abuse. Had STEMI in May of 2012 and was treated with BMS to the 3rd OM.  Follow up Myoview in June of 2012 was satisfactory. In April 2015 Myoview was intermediate risk. He underwent cardiac cath which showed severe stenosis in the proximal LAD. He had successful DES of the LAD. He was intolerant of Brilinta due to dyspnea and was switched to Effient.   In December 2015 he was admitted with new atrial fibrillation with RVR. Labs were normal. He converted spontaneously. Echo was normal. He was placed on Eliquis and Plavix and ASA were stopped. He was started on diltiazem. He later developed increased symptoms of palpitations with recurrent Afib. In February he underwent AFib ablation with Dr. Elberta Fortis with excellent results.  On follow up today he reports no palpitations over the past month.  Denies any chest pain, SOB, or dizziness.   Current Outpatient Prescriptions  Medication Sig Dispense Refill  . apixaban (ELIQUIS) 5 MG TABS tablet Take 1 tablet (5 mg total) by mouth 2 (two) times daily. 60 tablet 11  . atorvastatin (LIPITOR) 80 MG tablet Take 0.5 tablets (40 mg total) by mouth daily. 45 tablet 3  . diltiazem (CARDIZEM CD) 120 MG 24 hr capsule Take 1 capsule (120 mg total) by mouth daily. 90 capsule 2  . metoprolol (LOPRESSOR) 50 MG tablet Take 1 tablet (50 mg total) by mouth 2 (two) times daily. 180 tablet 3  . NITROSTAT 0.4 MG SL tablet TAKE 1 TABLET BY MOUTH EVERY 5 MINS AN FOR UP TO 3 DOSES 25 tablet 6  . omeprazole (PRILOSEC) 40 MG capsule Take 40 mg by mouth daily.   6   No current facility-administered medications for this visit.    Allergies  Allergen Reactions  . Penicillins     Passed out and got weak  Has patient had a PCN  reaction causing immediate rash, facial/tongue/throat swelling, SOB or lightheadedness with hypotension: Yes Has patient had a PCN reaction causing severe rash involving mucus membranes or skin necrosis: No Has patient had a PCN reaction that required hospitalization No Has patient had a PCN reaction occurring within the last 10 years: No If all of the above answers are "NO", then may proceed with Cephalosporin use.    Past Medical History  Diagnosis Date  . CAD (coronary artery disease)   . Hyperlipidemia   . Hypertension   . Tobacco abuse   . SVT (supraventricular tachycardia) (HCC)   . STEMI (ST elevation myocardial infarction) (HCC) 04/23/11  . S/P coronary artery stent placement 04/23/11    BMS to the 3rd OM; has residual disease  . Atrial fibrillation [facility])     Past Surgical History  Procedure Laterality Date  . Svt ablation   2008    Dr. Ladona Ridgel  . Coronary stent placement  04/23/11    BMS to the 3rd OM  . Left heart catheterization with coronary angiogram N/A 04/13/2014    Procedure: LEFT HEART CATHETERIZATION WITH CORONARY ANGIOGRAM;  Surgeon: Rashada Klontz M Swaziland, MD;  Location: [facility] CATH LAB;  Service: Cardiovascular;  Laterality: N/A;  . Percutaneous coronary stent intervention (pci-s) Right 04/13/2014    Procedure: PERCUTANEOUS CORONARY STENT INTERVENTION (PCI-S);  Surgeon: Jillienne Egner M Swaziland, MD;  Location: [facility] CATH LAB;  Service: Cardiovascular;  Laterality: Right;  . Electrophysiologic study N/A 01/25/2016    Procedure: Atrial Fibrillation Ablation;  Surgeon: Will Jorja LoaMartin Camnitz, MD;  Location: [facility] INVASIVE CV LAB;  Service: Cardiovascular;  Laterality: N/A;    History  Smoking status  . Former Smoker  . Quit date: 04/23/2011  Smokeless tobacco  . Former NeurosurgeonUser  . Quit date: 04/23/2011    History  Alcohol Use No    Family History  Problem Relation Age of Onset  . Hypertension Mother     Review of Systems: The review of systems is per the HPI.  All other systems were reviewed  and are negative.  Physical Exam: BP 112/70 mmHg  Pulse 67  Ht 5\' 7"  (1.702 m)  Wt 73.029 kg (161 lb)  BMI 25.21 kg/m2 Patient is very pleasant and in no acute distress. Skin is warm and dry. Color is normal.  HEENT is unremarkable. Normocephalic/atraumatic. PERRL. Sclera are nonicteric. Neck is supple. No masses. No JVD. Lungs are clear. Cardiac exam shows a regular rate and rhythm. Normal S1-2 without gallop or murmur. Abdomen is soft. Extremities are without edema.  Gait and ROM are intact. No gross neurologic deficits noted.  LABORATORY DATA: Lab Results  Component Value Date   WBC 5.8 01/16/2016   HGB 16.0 01/16/2016   HCT 47.4 01/16/2016   PLT 221 01/16/2016   GLUCOSE 94 01/16/2016   CHOL 137 03/24/2015   TRIG 118 03/24/2015   HDL 33* 03/24/2015   LDLCALC 80 03/24/2015   ALT 36 03/24/2015   AST 22 03/24/2015   NA 144 01/16/2016   K 4.7 01/16/2016   CL 107 01/16/2016   CREATININE 0.84 01/16/2016   BUN 14 01/16/2016   CO2 29 01/16/2016   TSH 1.458 12/11/2014   INR 1.04 12/10/2014   HGBA1C * 04/23/2011    6.0 (NOTE)                                                                       According to the ADA Clinical Practice Recommendations for 2011, when HbA1c is used as a screening test:   >=6.5%   Diagnostic of Diabetes Mellitus           (if abnormal result  is confirmed)  5.7-6.4%   Increased risk of developing Diabetes Mellitus  References:Diagnosis and Classification of Diabetes Mellitus,Diabetes Care,2011,34(Suppl 1):S62-S69 and Standards of Medical Care in         Diabetes - 2011,Diabetes Care,2011,34  (Suppl 1):S11-S61.      Assessment / Plan: 1. CAD - remote BMS of the OM- still patent. S/p DES of LAD 04/13/14. Asymptomatic.   2. Atrial fibrillation. Paroxysmal. S/p successful ablation in February 2017. Continue eliquis and diltiazem/metoprolol.  3. HLD - on high dose statin therapy. Stressed importance of dietary modification. Lipids satisfactory.  4.  Tobacco abuse - he has quit  5. HTN- controlled on medication.  I will follow up in 6 months.

## 2016-07-31 ENCOUNTER — Other Ambulatory Visit: Payer: Self-pay | Admitting: Cardiology

## 2016-07-31 NOTE — Telephone Encounter (Signed)
Rx(s) sent to pharmacy electronically.  

## 2016-08-27 ENCOUNTER — Other Ambulatory Visit: Payer: Self-pay | Admitting: Cardiology

## 2016-08-29 MED ORDER — METOPROLOL TARTRATE 50 MG PO TABS: 50.0000 mg | ORAL_TABLET | Freq: Two times a day (BID) | ORAL | 6 refills | Status: DC

## 2016-09-18 ENCOUNTER — Other Ambulatory Visit: Payer: Self-pay | Admitting: *Deleted

## 2016-09-18 MED ORDER — NITROGLYCERIN 0.4 MG SL SUBL: 0.4000 mg | SUBLINGUAL_TABLET | SUBLINGUAL | 3 refills | Status: DC | PRN

## 2016-10-13 ENCOUNTER — Other Ambulatory Visit: Payer: Self-pay | Admitting: Cardiology

## 2016-12-09 NOTE — Progress Notes (Signed)
Mark SproutJeffery Brunson Date of Birth: August 09, 1961 Medical Record #[id number]  History of Present Illness: Mark [name] is seen for follow up Atrial fibrillation and CAD. He has HLD, CAD, HTN, SVT with remote ablation in 2008 per Dr. Ladona Ridgelaylor and prior history of tobacco abuse. Had STEMI in May of 2012 and was treated with BMS to the 3rd OM.  Follow up Myoview in June of 2012 was satisfactory. In April 2015 Myoview was intermediate risk. He underwent cardiac cath which showed severe stenosis in the proximal LAD. He had successful DES of the LAD. He was intolerant of Brilinta due to dyspnea and was switched to Effient.   In December 2015 he was admitted with new atrial fibrillation with RVR. Labs were normal. He converted spontaneously. Echo was normal. He was placed on Eliquis and Plavix and ASA were stopped. He was started on diltiazem. He later developed increased symptoms of palpitations with recurrent Afib. In February 2017 he underwent AFib ablation with Dr. Elberta Fortisamnitz with excellent results.  On follow up today he reports he is doing very well. No palpitations.  Denies any chest pain, SOB, or dizziness. Is working hard and exercising when he can. He is not smoking.  Current Outpatient Prescriptions  Medication Sig Dispense Refill  . atorvastatin (LIPITOR) 80 MG tablet Take 0.5 tablets (40 mg total) by mouth daily. 45 tablet 3  . diltiazem (CARDIZEM CD) 120 MG 24 hr capsule TAKE ONE CAPSULE BY MOUTH EVERY DAY 90 capsule 3  . ELIQUIS 5 MG TABS tablet TAKE 1 TABLET (5 MG TOTAL) BY MOUTH 2 (TWO) TIMES DAILY. 60 tablet 10  . metoprolol (LOPRESSOR) 50 MG tablet Take 1 tablet (50 mg total) by mouth 2 (two) times daily. 60 tablet 6  . nitroGLYCERIN (NITROSTAT) 0.4 MG SL tablet Place 1 tablet (0.4 mg total) under the tongue every 5 (five) minutes as needed for chest pain (On 3rd dose call 911). 25 tablet 3  . omeprazole (PRILOSEC) 40 MG capsule Take 40 mg by mouth daily.   6   No current facility-administered medications  for this visit.     Allergies  Allergen Reactions  . Penicillins     Passed out and got weak  Has patient had a PCN reaction causing immediate rash, facial/tongue/throat swelling, SOB or lightheadedness with hypotension: Yes Has patient had a PCN reaction causing severe rash involving mucus membranes or skin necrosis: No Has patient had a PCN reaction that required hospitalization No Has patient had a PCN reaction occurring within the last 10 years: No If all of the above answers are "NO", then may proceed with Cephalosporin use.    Past Medical History:  Diagnosis Date  . Atrial fibrillation (HCC)   . CAD (coronary artery disease)   . Hyperlipidemia   . Hypertension   . S/P coronary artery stent placement 04/23/11   BMS to the 3rd OM; has residual disease  . STEMI (ST elevation myocardial infarction) (HCC) 04/23/11  . SVT (supraventricular tachycardia) (HCC)   . Tobacco abuse     Past Surgical History:  Procedure Laterality Date  . CORONARY STENT PLACEMENT  04/23/11   BMS to the 3rd OM  . ELECTROPHYSIOLOGIC STUDY N/A 01/25/2016   Procedure: Atrial Fibrillation Ablation;  Surgeon: Will Jorja LoaMartin Camnitz, MD;  Location: [facility] INVASIVE CV LAB;  Service: Cardiovascular;  Laterality: N/A;  . LEFT HEART CATHETERIZATION WITH CORONARY ANGIOGRAM N/A 04/13/2014   Procedure: LEFT HEART CATHETERIZATION WITH CORONARY ANGIOGRAM;  Surgeon: Peter M SwazilandJordan, MD;  [facility];  Service: Cardiovascular;  Laterality: N/A;  . PERCUTANEOUS CORONARY STENT INTERVENTION (PCI-S) Right 04/13/2014   Procedure: PERCUTANEOUS CORONARY STENT INTERVENTION (PCI-S);  Surgeon: Peter M SwazilandJordan, MD;  [facility];  Service: Cardiovascular;  Laterality: Right;  . SVT ABLATION   2008   Dr. Ladona Ridgelaylor    History  Smoking Status  . Former Smoker  . Quit date: 04/23/2011  Smokeless Tobacco  . Former NeurosurgeonUser  . Quit date: 04/23/2011    History  Alcohol Use No    Family History  Problem Relation Age of Onset  .  Hypertension Mother     Review of Systems: The review of systems is per the HPI.  All other systems were reviewed and are negative.  Physical Exam: BP 118/82   Pulse 74   Ht 5\' 7"  (1.702 m)   Wt 163 lb (73.9 kg)   BMI 25.53 kg/m  Patient is very pleasant and in no acute distress. Skin is warm and dry. Color is normal.  HEENT is unremarkable. Normocephalic/atraumatic. PERRL. Sclera are nonicteric. Neck is supple. No masses. No JVD. Lungs are clear. Cardiac exam shows a regular rate and rhythm. Normal S1-2 without gallop or murmur. Abdomen is soft. Extremities are without edema.  Gait and ROM are intact. No gross neurologic deficits noted.  LABORATORY DATA: Lab Results  Component Value Date   WBC 5.8 01/16/2016   HGB 16.0 01/16/2016   HCT 47.4 01/16/2016   PLT 221 01/16/2016   GLUCOSE 94 01/16/2016   CHOL 137 03/24/2015   TRIG 118 03/24/2015   HDL 33 (L) 03/24/2015   LDLCALC 80 03/24/2015   ALT 36 03/24/2015   AST 22 03/24/2015   NA 144 01/16/2016   K 4.7 01/16/2016   CL 107 01/16/2016   CREATININE 0.84 01/16/2016   BUN 14 01/16/2016   CO2 29 01/16/2016   TSH 1.458 12/11/2014   INR 1.04 12/10/2014   HGBA1C (H) 04/23/2011    6.0 (NOTE)                                                                       According to the ADA Clinical Practice Recommendations for 2011, when HbA1c is used as a screening test:   >=6.5%   Diagnostic of Diabetes Mellitus           (if abnormal result  is confirmed)  5.7-6.4%   Increased risk of developing Diabetes Mellitus  References:Diagnosis and Classification of Diabetes Mellitus,Diabetes Care,2011,34(Suppl 1):S62-S69 and Standards of Medical Care in         Diabetes - 2011,Diabetes Care,2011,34  (Suppl 1):S11-S61.      Assessment / Plan: 1. CAD - remote BMS of the OM. S/p DES of LAD 04/13/14. Asymptomatic.   2. Atrial fibrillation. Paroxysmal. S/p successful ablation in February 2017. Maintaining NSR. Continue eliquis and  diltiazem/metoprolol. Follow up with Dr. Elberta Fortisamnitz in early 2018.   3. HLD - on high dose statin therapy. Stressed importance of dietary modification. Will check fasting lab work today.  4. Tobacco abuse - he has quit  5. HTN- controlled on medication.  I will follow up in 6 months.

## 2016-12-13 ENCOUNTER — Encounter: Payer: Self-pay | Admitting: Cardiology

## 2016-12-13 ENCOUNTER — Ambulatory Visit (INDEPENDENT_AMBULATORY_CARE_PROVIDER_SITE_OTHER): Payer: Federal, State, Local not specified - PPO | Admitting: Cardiology

## 2016-12-13 VITALS — BP 118/82 | HR 74 | Ht 67.0 in | Wt 163.0 lb

## 2016-12-13 DIAGNOSIS — I2510 Atherosclerotic heart disease of native coronary artery without angina pectoris: Secondary | ICD-10-CM | POA: Diagnosis not present

## 2016-12-13 DIAGNOSIS — I480 Paroxysmal atrial fibrillation: Secondary | ICD-10-CM

## 2016-12-13 DIAGNOSIS — I10 Essential (primary) hypertension: Secondary | ICD-10-CM

## 2016-12-13 DIAGNOSIS — E7800 Pure hypercholesterolemia, unspecified: Secondary | ICD-10-CM

## 2016-12-13 LAB — LIPID PANEL
CHOL/HDL RATIO: 5.8 ratio — AB (ref <5.0)
Cholesterol: 115 mg/dL (ref <200)
HDL: 20 mg/dL — AB (ref >40)
LDL Cholesterol: 56 mg/dL (ref <100)
Triglycerides: 195 mg/dL — ABNORMAL HIGH (ref <150)
VLDL: 39 mg/dL — AB (ref <30)

## 2016-12-13 LAB — CBC WITH DIFFERENTIAL/PLATELET
BASOS PCT: 0 %
Basophils Absolute: 0 cells/uL (ref 0–200)
EOS PCT: 1 %
Eosinophils Absolute: 87 cells/uL (ref 15–500)
HCT: 43.2 % (ref 38.5–50.0)
Hemoglobin: 15 g/dL (ref 13.2–17.1)
LYMPHS ABS: 1566 {cells}/uL (ref 850–3900)
LYMPHS PCT: 18 %
MCH: 30.9 pg (ref 27.0–33.0)
MCHC: 34.7 g/dL (ref 32.0–36.0)
MCV: 89.1 fL (ref 80.0–100.0)
MONO ABS: 522 {cells}/uL (ref 200–950)
MPV: 10.3 fL (ref 7.5–12.5)
Monocytes Relative: 6 %
NEUTROS PCT: 75 %
Neutro Abs: 6525 cells/uL (ref 1500–7800)
Platelets: 266 10*3/uL (ref 140–400)
RBC: 4.85 MIL/uL (ref 4.20–5.80)
RDW: 13.4 % (ref 11.0–15.0)
WBC: 8.7 10*3/uL (ref 3.8–10.8)

## 2016-12-13 LAB — HEPATIC FUNCTION PANEL
ALBUMIN: 4.1 g/dL (ref 3.6–5.1)
ALT: 48 U/L — ABNORMAL HIGH (ref 9–46)
AST: 26 U/L (ref 10–35)
Alkaline Phosphatase: 79 U/L (ref 40–115)
BILIRUBIN DIRECT: 0.1 mg/dL (ref <=0.2)
BILIRUBIN TOTAL: 0.4 mg/dL (ref 0.2–1.2)
Indirect Bilirubin: 0.3 mg/dL (ref 0.2–1.2)
Total Protein: 6.4 g/dL (ref 6.1–8.1)

## 2016-12-13 LAB — BASIC METABOLIC PANEL
BUN: 13 mg/dL (ref 7–25)
CALCIUM: 8.9 mg/dL (ref 8.6–10.3)
CHLORIDE: 107 mmol/L (ref 98–110)
CO2: 25 mmol/L (ref 20–31)
Creat: 0.73 mg/dL (ref 0.70–1.33)
GLUCOSE: 109 mg/dL — AB (ref 65–99)
POTASSIUM: 4.6 mmol/L (ref 3.5–5.3)
SODIUM: 141 mmol/L (ref 135–146)

## 2016-12-13 NOTE — Patient Instructions (Signed)
Continue your current medication  We will check blood work today  I will see you in 6 months.

## 2017-05-07 DIAGNOSIS — K080 Exfoliation of teeth due to systemic causes: Secondary | ICD-10-CM | POA: Diagnosis not present

## 2017-05-08 ENCOUNTER — Telehealth: Payer: Self-pay | Admitting: Cardiology

## 2017-05-08 NOTE — Telephone Encounter (Signed)
Will route to the physician for further recommendation.

## 2017-05-08 NOTE — Telephone Encounter (Signed)
He can hold Eliquis for 24 hours prior to dental extraction then resume after. Otherwise cleared for procedure.   Peter SwazilandJordan MD, [facility]

## 2017-05-08 NOTE — Telephone Encounter (Signed)
New message  Request for surgical clearance:  1. What type of surgery is being performed? Tooth Extraction   2. When is this surgery scheduled? Not scheduled  3. Are there any medications that need to be held prior to surgery and how long? She would like to know if any medications need to be held. Pt will be given  Articaine for procedure   4. Name of physician performing surgery? Dr. Hart RochesterHollis   5. What is your office phone and fax number? [phone] fax [phone]

## 2017-05-08 NOTE — Telephone Encounter (Signed)
Spoke to GriffinFran with Dr.Borden's office.Dr.Jordan's recommendation given.Note faxed to her at fax # [phone].  Called patient left Dr.Jordan's recommendation on personal voice mail.

## 2017-05-13 DIAGNOSIS — Z86010 Personal history of colonic polyps: Secondary | ICD-10-CM | POA: Diagnosis not present

## 2017-05-13 DIAGNOSIS — K219 Gastro-esophageal reflux disease without esophagitis: Secondary | ICD-10-CM | POA: Diagnosis not present

## 2017-05-26 ENCOUNTER — Other Ambulatory Visit: Payer: Self-pay | Admitting: Cardiology

## 2017-05-27 ENCOUNTER — Other Ambulatory Visit: Payer: Self-pay

## 2017-05-27 MED ORDER — METOPROLOL TARTRATE 50 MG PO TABS: 50.0000 mg | ORAL_TABLET | Freq: Two times a day (BID) | ORAL | 6 refills | Status: DC

## 2017-05-27 NOTE — Telephone Encounter (Signed)
This is Dr. Jordan's pt. °

## 2017-06-04 NOTE — Progress Notes (Signed)
Mark [name] Date of Birth: 1961/07/04 Medical Record #[id number]  History of Present Illness: Mark Mark [name] is seen for follow up Atrial fibrillation and CAD. He has HLD, CAD, HTN, SVT with remote ablation in 2008 per Dr. Ladona Ridgelaylor and prior history of tobacco abuse. Had STEMI in May of 2012 and was treated with BMS to the 3rd OM.  Follow up Myoview in June of 2012 was satisfactory. In April 2015 Myoview was intermediate risk. He underwent cardiac cath which showed severe stenosis in the proximal LAD. He had successful DES of the LAD. He was intolerant of Brilinta due to dyspnea and was switched to Effient.   In December 2015 he was admitted with new atrial fibrillation with RVR. Labs were normal. He converted spontaneously. Echo was normal. He was placed on Eliquis and Plavix and ASA were stopped. He was started on diltiazem. He later developed increased symptoms of palpitations with recurrent Afib. In February 2017 he underwent AFib ablation with Dr. Elberta Fortisamnitz with excellent results.   On follow up today he reports he is doing very well. No palpitations.  Working a lot in the hot sun without difficulty. Denies any chest pain, SOB, or dizziness.  He is not smoking.  Current Outpatient Prescriptions  Medication Sig Dispense Refill  . atorvastatin (LIPITOR) 80 MG tablet Take 0.5 tablets (40 mg total) by mouth daily. 45 tablet 3  . ELIQUIS 5 MG TABS tablet TAKE 1 TABLET (5 MG TOTAL) BY MOUTH 2 (TWO) TIMES DAILY. 60 tablet 10  . metoprolol tartrate (LOPRESSOR) 50 MG tablet Take 1 tablet (50 mg total) by mouth 2 (two) times daily. 60 tablet 6  . nitroGLYCERIN (NITROSTAT) 0.4 MG SL tablet Place 1 tablet (0.4 mg total) under the tongue every 5 (five) minutes as needed for chest pain (On 3rd dose call 911). 25 tablet 3  . omeprazole (PRILOSEC) 40 MG capsule Take 40 mg by mouth daily.   6   No current facility-administered medications for this visit.     Allergies  Allergen Reactions  . Penicillins     Passed  out and got weak  Has patient had a PCN reaction causing immediate rash, facial/tongue/throat swelling, SOB or lightheadedness with hypotension: Yes Has patient had a PCN reaction causing severe rash involving mucus membranes or skin necrosis: No Has patient had a PCN reaction that required hospitalization No Has patient had a PCN reaction occurring within the last 10 years: No If all of the above answers are "NO", then may proceed with Cephalosporin use.    Past Medical History:  Diagnosis Date  . Atrial fibrillation (HCC)   . CAD (coronary artery disease)   . Hyperlipidemia   . Hypertension   . S/P coronary artery stent placement 04/23/11   BMS to the 3rd OM; has residual disease  . STEMI (ST elevation myocardial infarction) (HCC) 04/23/11  . SVT (supraventricular tachycardia) (HCC)   . Tobacco abuse     Past Surgical History:  Procedure Laterality Date  . CORONARY STENT PLACEMENT  04/23/11   BMS to the 3rd OM  . ELECTROPHYSIOLOGIC STUDY N/A 01/25/2016   Procedure: Atrial Fibrillation Ablation;  Surgeon: Will Jorja LoaMartin Camnitz, MD;  Location: [facility];  Service: Cardiovascular;  Laterality: N/A;  . LEFT HEART CATHETERIZATION WITH CORONARY ANGIOGRAM N/A 04/13/2014   Procedure: LEFT HEART CATHETERIZATION WITH CORONARY ANGIOGRAM;  Surgeon: Amiir Heckard M [place]Jordan, MD;  [facility];  Service: Cardiovascular;  Laterality: N/A;  . PERCUTANEOUS CORONARY STENT INTERVENTION (PCI-S) Right 04/13/2014  Procedure: PERCUTANEOUS CORONARY STENT INTERVENTION (PCI-S);  Surgeon: Rhylei Mcquaig M [place], MD;  Location: [facility] CATH LAB;  Service: Cardiovascular;  Laterality: Right;  . SVT ABLATION   2008   Dr. Ladona Ridgel    History  Smoking Status  . Former Smoker  . Quit date: 04/23/2011  Smokeless Tobacco  . Former Neurosurgeon  . Quit date: 04/23/2011    History  Alcohol Use No    Family History  Problem Relation Age of Onset  . Hypertension Mother     Review of Systems: The review of systems is per the  HPI.  All other systems were reviewed and are negative.  Physical Exam: BP 120/78   Pulse 72   Ht 5\' 7"  (1.702 m)   Wt 160 lb (72.6 kg)   BMI 25.06 kg/m  Patient is very pleasant and in no acute distress. Skin is warm and dry. Color is normal.  HEENT is unremarkable. Normocephalic/atraumatic. PERRL. Sclera are nonicteric. Neck is supple. No masses. No JVD. Lungs are clear. Cardiac exam shows a regular rate and rhythm with extrasystoles. Normal S1-2 without gallop or murmur. Abdomen is soft. Extremities are without edema.  Gait and ROM are intact. No gross neurologic deficits noted.  LABORATORY DATA: Lab Results  Component Value Date   WBC 8.7 12/13/2016   HGB 15.0 12/13/2016   HCT 43.2 12/13/2016   PLT 266 12/13/2016   GLUCOSE 109 (H) 12/13/2016   CHOL 115 12/13/2016   TRIG 195 (H) 12/13/2016   HDL 20 (L) 12/13/2016   LDLCALC 56 12/13/2016   ALT 48 (H) 12/13/2016   AST 26 12/13/2016   NA 141 12/13/2016   K 4.6 12/13/2016   CL 107 12/13/2016   CREATININE 0.73 12/13/2016   BUN 13 12/13/2016   CO2 25 12/13/2016   TSH 1.458 12/11/2014   INR 1.04 12/10/2014   HGBA1C (H) 04/23/2011    6.0 (NOTE)                                                                       According to the ADA Clinical Practice Recommendations for 2011, when HbA1c is used as a screening test:   >=6.5%   Diagnostic of Diabetes Mellitus           (if abnormal result  is confirmed)  5.7-6.4%   Increased risk of developing Diabetes Mellitus  References:Diagnosis and Classification of Diabetes Mellitus,Diabetes Care,2011,34(Suppl 1):S62-S69 and Standards of Medical Care in         Diabetes - 2011,Diabetes Care,2011,34  (Suppl 1):S11-S61.    Ecg shows NSR with PACs. Nonspecific T wave abnormality.   Assessment / Plan: 1. CAD - remote BMS of the OM. S/p DES of LAD 04/13/14. Asymptomatic. Continue metoprolol.   2. Atrial fibrillation. Paroxysmal. S/p successful ablation in February 2017. Maintaining NSR.  Continue eliquis and metoprolol. Will discontinue diltiazem at this time.  3. HLD - on high dose statin therapy. LDL 56 in December.   4. Tobacco abuse - he has quit  5. HTN-  Well controlled on medication.  I will follow up in 6 months with fasting labs.

## 2017-06-05 ENCOUNTER — Other Ambulatory Visit: Payer: Self-pay | Admitting: Cardiology

## 2017-06-06 ENCOUNTER — Encounter: Payer: Self-pay | Admitting: Cardiology

## 2017-06-06 ENCOUNTER — Ambulatory Visit (INDEPENDENT_AMBULATORY_CARE_PROVIDER_SITE_OTHER): Payer: Federal, State, Local not specified - PPO | Admitting: Cardiology

## 2017-06-06 ENCOUNTER — Encounter (INDEPENDENT_AMBULATORY_CARE_PROVIDER_SITE_OTHER): Payer: Self-pay

## 2017-06-06 VITALS — BP 120/78 | HR 72 | Ht 67.0 in | Wt 160.0 lb

## 2017-06-06 DIAGNOSIS — I480 Paroxysmal atrial fibrillation: Secondary | ICD-10-CM | POA: Diagnosis not present

## 2017-06-06 DIAGNOSIS — I10 Essential (primary) hypertension: Secondary | ICD-10-CM

## 2017-06-06 DIAGNOSIS — I2510 Atherosclerotic heart disease of native coronary artery without angina pectoris: Secondary | ICD-10-CM

## 2017-06-06 DIAGNOSIS — E7800 Pure hypercholesterolemia, unspecified: Secondary | ICD-10-CM

## 2017-06-06 MED ORDER — METOPROLOL TARTRATE 50 MG PO TABS: 50.0000 mg | ORAL_TABLET | Freq: Two times a day (BID) | ORAL | 6 refills | Status: DC

## 2017-06-06 NOTE — Patient Instructions (Signed)
Stop taking Cardizem.  Continue your other therapy  I will see you in 6 months with lab work

## 2017-06-27 ENCOUNTER — Other Ambulatory Visit: Payer: Self-pay | Admitting: Cardiology

## 2017-06-29 ENCOUNTER — Other Ambulatory Visit: Payer: Self-pay | Admitting: Cardiology

## 2017-06-30 NOTE — Telephone Encounter (Signed)
REFILL 

## 2017-09-17 ENCOUNTER — Other Ambulatory Visit: Payer: Self-pay | Admitting: Cardiology

## 2017-11-13 ENCOUNTER — Ambulatory Visit: Payer: Federal, State, Local not specified - PPO | Admitting: Cardiology

## 2017-12-10 DIAGNOSIS — I471 Supraventricular tachycardia, unspecified: Secondary | ICD-10-CM | POA: Insufficient documentation

## 2017-12-10 DIAGNOSIS — I10 Essential (primary) hypertension: Secondary | ICD-10-CM | POA: Insufficient documentation

## 2017-12-23 DIAGNOSIS — E7800 Pure hypercholesterolemia, unspecified: Secondary | ICD-10-CM | POA: Diagnosis not present

## 2017-12-23 DIAGNOSIS — I2510 Atherosclerotic heart disease of native coronary artery without angina pectoris: Secondary | ICD-10-CM | POA: Diagnosis not present

## 2017-12-23 DIAGNOSIS — I10 Essential (primary) hypertension: Secondary | ICD-10-CM | POA: Diagnosis not present

## 2017-12-23 DIAGNOSIS — I480 Paroxysmal atrial fibrillation: Secondary | ICD-10-CM | POA: Diagnosis not present

## 2017-12-23 LAB — CBC WITH DIFFERENTIAL/PLATELET
BASOS: 0 %
Basophils Absolute: 0 10*3/uL (ref 0.0–0.2)
EOS (ABSOLUTE): 0.1 10*3/uL (ref 0.0–0.4)
EOS: 1 %
HEMATOCRIT: 42.6 % (ref 37.5–51.0)
HEMOGLOBIN: 15 g/dL (ref 13.0–17.7)
IMMATURE GRANULOCYTES: 0 %
Immature Grans (Abs): 0 10*3/uL (ref 0.0–0.1)
Lymphocytes Absolute: 1.7 10*3/uL (ref 0.7–3.1)
Lymphs: 30 %
MCH: 29.9 pg (ref 26.6–33.0)
MCHC: 35.2 g/dL (ref 31.5–35.7)
MCV: 85 fL (ref 79–97)
MONOCYTES: 11 %
Monocytes Absolute: 0.6 10*3/uL (ref 0.1–0.9)
NEUTROS PCT: 58 %
Neutrophils Absolute: 3.2 10*3/uL (ref 1.4–7.0)
Platelets: 261 10*3/uL (ref 150–379)
RBC: 5.01 x10E6/uL (ref 4.14–5.80)
RDW: 13.3 % (ref 12.3–15.4)
WBC: 5.6 10*3/uL (ref 3.4–10.8)

## 2017-12-23 LAB — LIPID PANEL
Chol/HDL Ratio: 5.3 ratio — ABNORMAL HIGH (ref 0.0–5.0)
Cholesterol, Total: 138 mg/dL (ref 100–199)
HDL: 26 mg/dL — ABNORMAL LOW (ref >39)
LDL Calculated: 64 mg/dL (ref 0–99)
Triglycerides: 239 mg/dL — ABNORMAL HIGH (ref 0–149)
VLDL Cholesterol Cal: 48 mg/dL — ABNORMAL HIGH (ref 5–40)

## 2017-12-23 LAB — BASIC METABOLIC PANEL
BUN / CREAT RATIO: 22 — AB (ref 9–20)
BUN: 20 mg/dL (ref 6–24)
CHLORIDE: 104 mmol/L (ref 96–106)
CO2: 23 mmol/L (ref 20–29)
Calcium: 9.2 mg/dL (ref 8.7–10.2)
Creatinine, Ser: 0.92 mg/dL (ref 0.76–1.27)
GFR calc non Af Amer: 92 mL/min/{1.73_m2} (ref >59)
GFR, EST AFRICAN AMERICAN: 106 mL/min/{1.73_m2} (ref >59)
Glucose: 114 mg/dL — ABNORMAL HIGH (ref 65–99)
POTASSIUM: 4.7 mmol/L (ref 3.5–5.2)
Sodium: 141 mmol/L (ref 134–144)

## 2017-12-23 LAB — HEPATIC FUNCTION PANEL
ALBUMIN: 4.2 g/dL (ref 3.5–5.5)
ALT: 30 IU/L (ref 0–44)
AST: 23 IU/L (ref 0–40)
Alkaline Phosphatase: 86 IU/L (ref 39–117)
BILIRUBIN, DIRECT: 0.14 mg/dL (ref 0.00–0.40)
Bilirubin Total: 0.5 mg/dL (ref 0.0–1.2)
TOTAL PROTEIN: 7.3 g/dL (ref 6.0–8.5)

## 2017-12-25 NOTE — Progress Notes (Signed)
Mark [name] Date of Birth: August 19, 1961 Medical Record #[id number]  History of Present Illness: Mark [name] is seen for follow up Atrial fibrillation and CAD. He has HLD, CAD, HTN, SVT with remote ablation in 2008 per Dr. Ladona Ridgel and prior history of tobacco abuse. Had STEMI in May of 2012 and was treated with BMS to the 3rd OM.  Follow up Myoview in June of 2012 was satisfactory. In April 2015 Myoview was intermediate risk. He underwent cardiac cath which showed severe stenosis in the proximal LAD. He had successful DES of the LAD. He was intolerant of Brilinta due to dyspnea and was switched to Effient.   In December 2015 he was admitted with new atrial fibrillation with RVR. Labs were normal. He converted spontaneously. Echo was normal. He was placed on Eliquis and Plavix and ASA were stopped. He was started on diltiazem. He later developed increased symptoms of palpitations with recurrent Afib. In February 2017 he underwent AFib ablation with Dr. Elberta Fortis with excellent results.   On follow up today he reports he is doing very well. No palpitations.  Stays active- now coaching basketball.  Denies any chest pain, SOB, or dizziness.  He is not smoking. Admits his diet could be better.  Current Outpatient Medications  Medication Sig Dispense Refill  . atorvastatin (LIPITOR) 80 MG tablet TAKE 0.5 TABLETS (40 MG TOTAL) BY MOUTH DAILY. 45 tablet 3  . ELIQUIS 5 MG TABS tablet TAKE 1 TABLET (5 MG TOTAL) BY MOUTH 2 (TWO) TIMES DAILY. 60 tablet 8  . metoprolol tartrate (LOPRESSOR) 50 MG tablet Take 1 tablet (50 mg total) by mouth 2 (two) times daily. 60 tablet 6  . nitroGLYCERIN (NITROSTAT) 0.4 MG SL tablet Place 1 tablet (0.4 mg total) under the tongue every 5 (five) minutes as needed for chest pain (On 3rd dose call 911). 25 tablet 3  . Omega-3 Fatty Acids (FISH OIL) 1000 MG CAPS Take 2 to 4 grams daily 100 capsule 6  . omeprazole (PRILOSEC) 40 MG capsule Take 40 mg by mouth daily.   6   No current  facility-administered medications for this visit.     Allergies  Allergen Reactions  . Penicillins     Passed out and got weak  Has patient had a PCN reaction causing immediate rash, facial/tongue/throat swelling, SOB or lightheadedness with hypotension: Yes Has patient had a PCN reaction causing severe rash involving mucus membranes or skin necrosis: No Has patient had a PCN reaction that required hospitalization No Has patient had a PCN reaction occurring within the last 10 years: No If all of the above answers are "NO", then may proceed with Cephalosporin use.    Past Medical History:  Diagnosis Date  . Atrial fibrillation (HCC)   . CAD (coronary artery disease)   . Hyperlipidemia   . Hypertension   . S/P coronary artery stent placement 04/23/11   BMS to the 3rd OM; has residual disease  . STEMI (ST elevation myocardial infarction) (HCC) 04/23/11  . SVT (supraventricular tachycardia) (HCC)   . Tobacco abuse     Past Surgical History:  Procedure Laterality Date  . CORONARY STENT PLACEMENT  04/23/11   BMS to the 3rd OM  . ELECTROPHYSIOLOGIC STUDY N/A 01/25/2016   Procedure: Atrial Fibrillation Ablation;  Surgeon: Will [name], MD;  Location: [facility];  Service: Cardiovascular;  Laterality: N/A;  . LEFT HEART CATHETERIZATION WITH CORONARY ANGIOGRAM N/A 04/13/2014   Procedure: LEFT HEART CATHETERIZATION WITH CORONARY ANGIOGRAM;  Surgeon: Demetria Pore  [place], MD;  [facility];  Service: Cardiovascular;  Laterality: N/A;  . PERCUTANEOUS CORONARY STENT INTERVENTION (PCI-S) Right 04/13/2014   Procedure: PERCUTANEOUS CORONARY STENT INTERVENTION (PCI-S);  Surgeon: Peter M [place]Jordan, MD;  [facility];  Service: Cardiovascular;  Laterality: Right;  . SVT ABLATION   2008   Dr. Ladona Ridgelaylor    Social History   Tobacco Use  Smoking Status Former Smoker  . Last attempt to quit: 04/23/2011  . Years since quitting: 6.6  Smokeless Tobacco Former NeurosurgeonUser  . Quit date: 04/23/2011     Social History   Substance and Sexual Activity  Alcohol Use No    Family History  Problem Relation Age of Onset  . Hypertension Mother     Review of Systems: The review of systems is per the HPI.  All other systems were reviewed and are negative.  Physical Exam: BP 132/82   Pulse 78   Ht 5\' 7"  (1.702 m)   Wt 161 lb 6.4 oz (73.2 kg)   BMI 25.28 kg/m  GENERAL:  Well appearing WM in NAD HEENT:  PERRL, EOMI, sclera are clear. Oropharynx is clear. NECK:  No jugular venous distention, carotid upstroke brisk and symmetric, no bruits, no thyromegaly or adenopathy LUNGS:  Clear to auscultation bilaterally CHEST:  Unremarkable HEART:  RRR,  PMI not displaced or sustained,S1 and S2 within normal limits, no S3, no S4: no clicks, no rubs, no murmurs ABD:  Soft, nontender. BS +, no masses or bruits. No hepatomegaly, no splenomegaly EXT:  2 + pulses throughout, no edema, no cyanosis no clubbing SKIN:  Warm and dry.  No rashes NEURO:  Alert and oriented x 3. Cranial nerves II through XII intact. PSYCH:  Cognitively intact    LABORATORY DATA: Lab Results  Component Value Date   WBC 5.6 12/23/2017   HGB 15.0 12/23/2017   HCT 42.6 12/23/2017   PLT 261 12/23/2017   GLUCOSE 114 (H) 12/23/2017   CHOL 138 12/23/2017   TRIG 239 (H) 12/23/2017   HDL 26 (L) 12/23/2017   LDLCALC 64 12/23/2017   ALT 30 12/23/2017   AST 23 12/23/2017   NA 141 12/23/2017   K 4.7 12/23/2017   CL 104 12/23/2017   CREATININE 0.92 12/23/2017   BUN 20 12/23/2017   CO2 23 12/23/2017   TSH 1.458 12/11/2014   INR 1.04 12/10/2014   HGBA1C (H) 04/23/2011    6.0 (NOTE)                                                                       According to the ADA Clinical Practice Recommendations for 2011, when HbA1c is used as a screening test:   >=6.5%   Diagnostic of Diabetes Mellitus           (if abnormal result  is confirmed)  5.7-6.4%   Increased risk of developing Diabetes Mellitus  References:Diagnosis  and Classification of Diabetes Mellitus,Diabetes Care,2011,34(Suppl 1):S62-S69 and Standards of Medical Care in         Diabetes - 2011,Diabetes Care,2011,34  (Suppl 1):S11-S61.    Assessment / Plan: 1. CAD - remote BMS of the OM. S/p DES of LAD 04/13/14. Asymptomatic. Continue metoprolol.   2. Atrial fibrillation. Paroxysmal. S/p  successful ablation in February 2017. Maintaining NSR. Continue eliquis and metoprolol.   3. HLD - on high dose statin therapy. LDL 64. At goal. Triglycerides are higher. Recommend adding fish oil 2-4 grams per day and eating healthier with reduced saturated fats and sweets.   4. Tobacco abuse - he has quit  5. HTN-  Well controlled on medication.  I will follow up in 6 months

## 2017-12-29 ENCOUNTER — Other Ambulatory Visit: Payer: Self-pay

## 2017-12-30 ENCOUNTER — Encounter: Payer: Self-pay | Admitting: Cardiology

## 2017-12-30 ENCOUNTER — Encounter (INDEPENDENT_AMBULATORY_CARE_PROVIDER_SITE_OTHER): Payer: Self-pay

## 2017-12-30 ENCOUNTER — Ambulatory Visit (INDEPENDENT_AMBULATORY_CARE_PROVIDER_SITE_OTHER): Payer: Federal, State, Local not specified - PPO | Admitting: Cardiology

## 2017-12-30 VITALS — BP 132/82 | HR 78 | Ht 67.0 in | Wt 161.4 lb

## 2017-12-30 DIAGNOSIS — I10 Essential (primary) hypertension: Secondary | ICD-10-CM

## 2017-12-30 DIAGNOSIS — I2510 Atherosclerotic heart disease of native coronary artery without angina pectoris: Secondary | ICD-10-CM | POA: Diagnosis not present

## 2017-12-30 DIAGNOSIS — I480 Paroxysmal atrial fibrillation: Secondary | ICD-10-CM | POA: Diagnosis not present

## 2017-12-30 DIAGNOSIS — E782 Mixed hyperlipidemia: Secondary | ICD-10-CM

## 2017-12-30 NOTE — Patient Instructions (Addendum)
Continue your current therapy with addition of fish oil  I will see you in about 6 months

## 2018-01-10 IMAGING — CT CT HEART MORP W/ CTA COR W/ SCORE W/ CA W/CM &/OR W/O CM
1 of 10 series · 1 of 20 positions shown, 2 images · non-contrast
Comparison: None.

CLINICAL DATA: Pre-atrial fibrillation ablation

EXAM:
Cardiac CTA
MEDICATIONS:
Sub lingual nitro. 4mg and lopressor 5mg IV x 1
TECHNIQUE: The patient was scanned on a [employer] [REDACTED]ice scanner. Gantry
rotation speed was 270 msecs. Collimation was .9mm. A 100 kV
prospective scan was triggered in the descending thoracic aorta at
111 HU's with 5% padding centered around 78% of the R-R interval.
Average HR during the scan was 60 bpm. The 3D data set was
interpreted on a dedicated work station using MPR, MIP and VRT
modes. A total of 80cc of contrast was used.

[Series 300: locator · axial · 0.35mm/px · z∈[-193,-193]mm · 1 of 1 slices shown, 2 images]
[im 1/1  vessel]
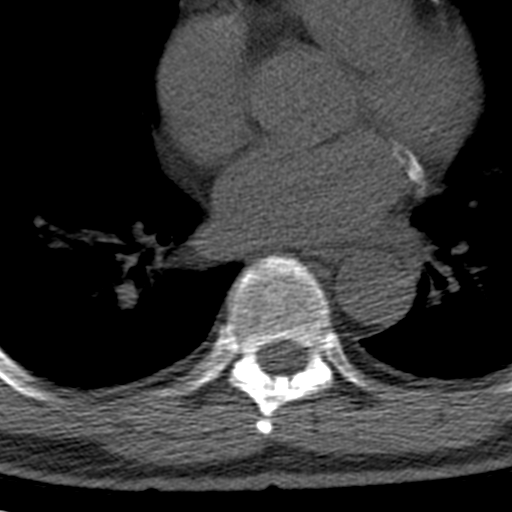
[im 1/1  lung]
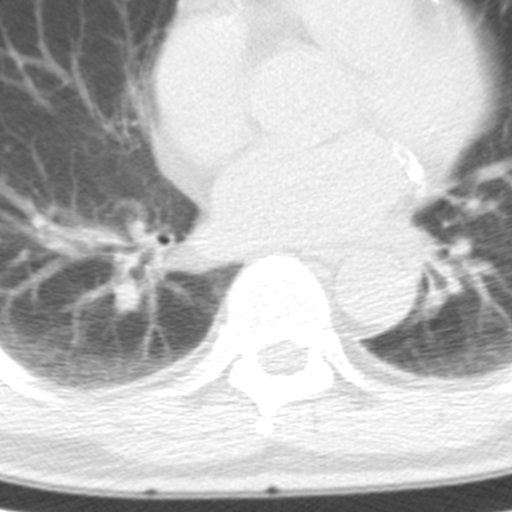

[1 of 20 positions shown; findings below may reference images not displayed]

FINDINGS: Non-cardiac: See separate report from [REDACTED].

The left atrial appendage showed no obvious thrombus.

There were 4 pulmonary veins draining normally to LA, 2 on left and
2 on right.

RUPV 19 x 16 mm

RLPV 19 x 17 mm

LUPV 20 x 13 mm

LLPV 17 x 10 mm

Coronary Arteries: Right dominant with no anomalies

LM:  Short vessel with no significant disease.

LAD system: There was a stent in the proximal LAD. This appeared to
be patent. The mid to distal LAD was small in caliber, suspect
diffuse disease.

Circumflex system: Small OM1 without mixed plaque, no significant
stenosis. Small OM2 with mixed plaque, no significant stenosis.
Large PLOM/OM3 with proximal stent. The stent appeared patent but
the PLOM was very small in caliber post-stent, possible significant
obstruction just distal to stent. The AV LCx was small beyond PLOM.

RCA: There was extensive mixed plaque in the proximal RCA with
possibly moderate stenosis. The PDA was small and poorly visualized.
IMPRESSION: 1.  The pulmonary veins appear normal, measurements above.

2. Extensive coronary artery disease. See description above.
Proximal LAD and proximal PLOM stents appeared patent, but the mid
to distal LAD appears diffusely diseased, there appears to be up to
moderate proximal RCA stenosis, and the distal PLOM appears
diseased. Would consider functional testing.

Ruam Chiodini

EXAM:
OVER-READ INTERPRETATION  CT CHEST

The following report is an over-read performed by radiologist Dr. UNAZI
Jim [REDACTED] on 01/17/2016. This over-read
does not include interpretation of cardiac or coronary anatomy or
pathology. The coronary calcium score/coronary CTA interpretation by
the cardiologist is attached.
FINDINGS: No mediastinal or hilar mass is identified. The aorta is normal in
caliber. Dense coronary artery calcifications are noted. The
esophagus is grossly normal.

Advanced emphysematous changes are noted in the lungs along with
pulmonary scarring. No acute pulmonary findings or worrisome
pulmonary lesions. Bibasilar scarring changes and dependent
subpleural atelectasis.
IMPRESSION: Advanced emphysematous changes but no acute pulmonary findings or
worrisome pulmonary lesions.

## 2018-02-20 ENCOUNTER — Emergency Department (HOSPITAL_BASED_OUTPATIENT_CLINIC_OR_DEPARTMENT_OTHER): Payer: Federal, State, Local not specified - PPO

## 2018-02-20 ENCOUNTER — Emergency Department (HOSPITAL_BASED_OUTPATIENT_CLINIC_OR_DEPARTMENT_OTHER)
Admission: EM | Admit: 2018-02-20 | Discharge: 2018-02-20 | Disposition: A | Discharge status: Home / Self Care | Payer: Federal, State, Local not specified - PPO | Attending: Emergency Medicine | Admitting: Emergency Medicine

## 2018-02-20 ENCOUNTER — Other Ambulatory Visit: Payer: Self-pay

## 2018-02-20 ENCOUNTER — Encounter (HOSPITAL_BASED_OUTPATIENT_CLINIC_OR_DEPARTMENT_OTHER): Payer: Self-pay | Admitting: Emergency Medicine

## 2018-02-20 DIAGNOSIS — Z7901 Long term (current) use of anticoagulants: Secondary | ICD-10-CM | POA: Insufficient documentation

## 2018-02-20 DIAGNOSIS — Z955 Presence of coronary angioplasty implant and graft: Secondary | ICD-10-CM | POA: Insufficient documentation

## 2018-02-20 DIAGNOSIS — Z87891 Personal history of nicotine dependence: Secondary | ICD-10-CM | POA: Insufficient documentation

## 2018-02-20 DIAGNOSIS — I2510 Atherosclerotic heart disease of native coronary artery without angina pectoris: Secondary | ICD-10-CM | POA: Diagnosis not present

## 2018-02-20 DIAGNOSIS — I252 Old myocardial infarction: Secondary | ICD-10-CM | POA: Insufficient documentation

## 2018-02-20 DIAGNOSIS — I10 Essential (primary) hypertension: Secondary | ICD-10-CM | POA: Insufficient documentation

## 2018-02-20 DIAGNOSIS — K529 Noninfective gastroenteritis and colitis, unspecified: Secondary | ICD-10-CM | POA: Diagnosis not present

## 2018-02-20 DIAGNOSIS — K659 Peritonitis, unspecified: Secondary | ICD-10-CM | POA: Diagnosis not present

## 2018-02-20 DIAGNOSIS — Z79899 Other long term (current) drug therapy: Secondary | ICD-10-CM | POA: Diagnosis not present

## 2018-02-20 DIAGNOSIS — K6389 Other specified diseases of intestine: Secondary | ICD-10-CM

## 2018-02-20 DIAGNOSIS — R1032 Left lower quadrant pain: Secondary | ICD-10-CM | POA: Diagnosis not present

## 2018-02-20 LAB — CBC WITH DIFFERENTIAL/PLATELET
BASOS ABS: 0 10*3/uL (ref 0.0–0.1)
Basophils Relative: 0 %
EOS PCT: 2 %
Eosinophils Absolute: 0.2 10*3/uL (ref 0.0–0.7)
HCT: 45 % (ref 39.0–52.0)
Hemoglobin: 15 g/dL (ref 13.0–17.0)
LYMPHS PCT: 19 %
Lymphs Abs: 2.2 10*3/uL (ref 0.7–4.0)
MCH: 29.6 pg (ref 26.0–34.0)
MCHC: 33.3 g/dL (ref 30.0–36.0)
MCV: 88.9 fL (ref 78.0–100.0)
MONO ABS: 1.1 10*3/uL — AB (ref 0.1–1.0)
Monocytes Relative: 9 %
Neutro Abs: 7.9 10*3/uL — ABNORMAL HIGH (ref 1.7–7.7)
Neutrophils Relative %: 70 %
PLATELETS: 290 10*3/uL (ref 150–400)
RBC: 5.06 MIL/uL (ref 4.22–5.81)
RDW: 13.8 % (ref 11.5–15.5)
WBC: 11.4 10*3/uL — ABNORMAL HIGH (ref 4.0–10.5)

## 2018-02-20 LAB — URINALYSIS, ROUTINE W REFLEX MICROSCOPIC
Bilirubin Urine: NEGATIVE
Glucose, UA: NEGATIVE mg/dL
Hgb urine dipstick: NEGATIVE
Ketones, ur: NEGATIVE mg/dL
LEUKOCYTES UA: NEGATIVE
Nitrite: NEGATIVE
Protein, ur: NEGATIVE mg/dL
SPECIFIC GRAVITY, URINE: 1.01 (ref 1.005–1.030)
pH: 8 (ref 5.0–8.0)

## 2018-02-20 LAB — COMPREHENSIVE METABOLIC PANEL
ALT: 34 U/L (ref 17–63)
AST: 30 U/L (ref 15–41)
Albumin: 4.1 g/dL (ref 3.5–5.0)
Alkaline Phosphatase: 82 U/L (ref 38–126)
Anion gap: 9 (ref 5–15)
BUN: 15 mg/dL (ref 6–20)
CHLORIDE: 104 mmol/L (ref 101–111)
CO2: 25 mmol/L (ref 22–32)
Calcium: 8.9 mg/dL (ref 8.9–10.3)
Creatinine, Ser: 0.81 mg/dL (ref 0.61–1.24)
Glucose, Bld: 144 mg/dL — ABNORMAL HIGH (ref 65–99)
POTASSIUM: 4.2 mmol/L (ref 3.5–5.1)
Sodium: 138 mmol/L (ref 135–145)
Total Bilirubin: 0.7 mg/dL (ref 0.3–1.2)
Total Protein: 7.4 g/dL (ref 6.5–8.1)

## 2018-02-20 LAB — LIPASE, BLOOD: LIPASE: 27 U/L (ref 11–51)

## 2018-02-20 MED ORDER — ONDANSETRON 4 MG PO TBDP: 4.0000 mg | ORAL_TABLET | Freq: Three times a day (TID) | ORAL | 0 refills | Status: DC | PRN

## 2018-02-20 MED ORDER — IOPAMIDOL (ISOVUE-300) INJECTION 61%
100.0000 mL | Freq: Once | INTRAVENOUS | Status: AC | PRN
  Administered 2018-02-20: 100 mL via INTRAVENOUS

## 2018-02-20 MED ORDER — SODIUM CHLORIDE 0.9 % IV BOLUS (SEPSIS)
1000.0000 mL | Freq: Once | INTRAVENOUS | Status: AC
  Administered 2018-02-20: 1000 mL via INTRAVENOUS

## 2018-02-20 NOTE — ED Provider Notes (Signed)
TIME SEEN: 4:21 AM  CHIEF COMPLAINT: Abdominal pain  HPI: Patient is a 57 year old male with history of CAD status post stent, hypertension, hyperlipidemia, atrial fibrillation on Eliquis who presents to the emergency department with abdominal pain that started yesterday.  Mostly in the left mid and left lower quadrant he describes as a dull pain.  No radiation.  No fevers, chills, nausea, vomiting, diarrhea, bloody stools, melena, dysuria or hematuria.  States he has had diverticulitis approximately 15 years ago but cannot remember if this was the same.  No history of abdominal surgery.  ROS: See HPI Constitutional: no fever  Eyes: no drainage  ENT: no runny nose   Cardiovascular:  no chest pain  Resp: no SOB  GI: no vomiting GU: no dysuria Integumentary: no rash  Allergy: no hives  Musculoskeletal: no leg swelling  Neurological: no slurred speech ROS otherwise negative  PAST MEDICAL HISTORY/PAST SURGICAL HISTORY:  Past Medical History:  Diagnosis Date  . Atrial fibrillation (HCC)   . CAD (coronary artery disease)   . Hyperlipidemia   . Hypertension   . S/P coronary artery stent placement 04/23/11   BMS to the 3rd OM; has residual disease  . STEMI (ST elevation myocardial infarction) (HCC) 04/23/11  . SVT (supraventricular tachycardia) (HCC)   . Tobacco abuse     MEDICATIONS:  Prior to Admission medications   Medication Sig Start Date End Date Taking? Authorizing Provider  atorvastatin (LIPITOR) 80 MG tablet TAKE 0.5 TABLETS (40 MG TOTAL) BY MOUTH DAILY. 06/30/17  Yes [place], Peter M, MD  ELIQUIS 5 MG TABS tablet TAKE 1 TABLET (5 MG TOTAL) BY MOUTH 2 (TWO) TIMES DAILY. 09/17/17  Yes [place], Peter M, MD  metoprolol tartrate (LOPRESSOR) 50 MG tablet Take 1 tablet (50 mg total) by mouth 2 (two) times daily. 06/06/17  Yes [place], Peter M, MD  nitroGLYCERIN (NITROSTAT) 0.4 MG SL tablet Place 1 tablet (0.4 mg total) under the tongue every 5 (five) minutes as needed for chest pain (On 3rd  dose call 911). 09/18/16  Yes [place], Peter M, MD  Omega-3 Fatty Acids (FISH OIL) 1000 MG CAPS Take 2 to 4 grams daily 12/29/17  Yes [place], Peter M, MD  omeprazole (PRILOSEC) 40 MG capsule Take 40 mg by mouth daily.  12/26/15  Yes [provider]    ALLERGIES:  Allergies  Allergen Reactions  . Penicillins     Passed out and got weak  Has patient had a PCN reaction causing immediate rash, facial/tongue/throat swelling, SOB or lightheadedness with hypotension: Yes Has patient had a PCN reaction causing severe rash involving mucus membranes or skin necrosis: No Has patient had a PCN reaction that required hospitalization No Has patient had a PCN reaction occurring within the last 10 years: No If all of the above answers are "NO", then may proceed with Cephalosporin use.    SOCIAL HISTORY:  Social History   Tobacco Use  . Smoking status: Former Smoker    Last attempt to quit: 04/23/2011    Years since quitting: 6.8  . Smokeless tobacco: Former NeurosurgeonUser    Quit date: 04/23/2011  Substance Use Topics  . Alcohol use: No    FAMILY HISTORY: Family History  Problem Relation Age of Onset  . Hypertension Mother     EXAM: BP (!) 149/87 (BP Location: Right Arm)   Pulse 99   Temp 98.2 F (36.8 C) (Oral)   Resp 14   Ht 5\' 7"  (1.702 m)   Wt 74.4 kg (164  lb)   SpO2 95%   BMI 25.69 kg/m  CONSTITUTIONAL: Alert and oriented and responds appropriately to questions. Well-appearing; well-nourished HEAD: Normocephalic EYES: Conjunctivae clear, pupils appear equal, EOMI ENT: normal nose; moist mucous membranes NECK: Supple, no meningismus, no nuchal rigidity, no LAD  CARD: RRR; S1 and S2 appreciated; no murmurs, no clicks, no rubs, no gallops RESP: Normal chest excursion without splinting or tachypnea; breath sounds clear and equal bilaterally; no wheezes, no rhonchi, no rales, no hypoxia or respiratory distress, speaking full sentences ABD/GI: Normal bowel sounds; non-distended; soft,  tender to palpation in the left mid and lower quadrant, no rebound, no guarding, no peritoneal signs, no hepatosplenomegaly BACK:  The back appears normal and is non-tender to palpation, there is no CVA tenderness EXT: Normal ROM in all joints; non-tender to palpation; no edema; normal capillary refill; no cyanosis, no calf tenderness or swelling    SKIN: Normal color for age and race; warm; no rash NEURO: Moves all extremities equally PSYCH: The patient's mood and manner are appropriate. Grooming and personal hygiene are appropriate.  MEDICAL DECISION MAKING: Patient here with abdominal pain.  Suspect diverticulitis but colitis, UTI, kidney stone, pyelonephritis also in the differential.  Less likely bowel obstruction.  Doubt appendicitis.  Will obtain CT of his abdomen pelvis, labs and urine.  He declines pain or nausea medicine at this time.  ED PROGRESS: Patient's labs show mild leukocytosis with left shift.  Otherwise unremarkable.  Patient has inflamed fat lobule in the left mid abdomen adjacent to the descending colon that may reflect acute epiploic appendagitis versus focal fat necrosis.  Urine shows no acute abnormality.  Discussed these findings with patient.  Have recommended alternating Tylenol and ibuprofen for pain.  At this time I do not feel he needs antibiotics.  He is well-appearing, afebrile, nontoxic.  He has not required any pain medicine here in the emergency department.  I do not feel at this time he needs surgical intervention.  Patient will follow up with his primary care physician as outpatient.  At this time, I do not feel there is any life-threatening condition present. I have reviewed and discussed all results (EKG, imaging, lab, urine as appropriate) and exam findings with patient/family. I have reviewed nursing notes and appropriate previous records.  I feel the patient is safe to be discharged home without further emergent workup and can continue workup as an outpatient as  needed. Discussed usual and customary return precautions. Patient/family verbalize understanding and are comfortable with this plan.  Outpatient follow-up has been provided if needed. All questions have been answered.     Rashonda Warrior, Layla Maw, DO 02/20/18 [phone]

## 2018-02-20 NOTE — ED Notes (Signed)
Patient transported to CT 

## 2018-02-20 NOTE — ED Notes (Signed)
Denies having to urinate, reminded of needing u/a

## 2018-02-20 NOTE — Discharge Instructions (Signed)
You may alternate Tylenol 1000 mg every 6 hours as needed for pain and Ibuprofen 600 mg every 8 hours as needed for pain.  Please take Ibuprofen with food.  You may take ibuprofen for the next 4 days.   ?Epiploic appendagitis, also known as appendicitis epiploica, hemorrhagic epiploitis, epiplopericolitis, or appendagitis, is an ischemic infarction of an epiploic appendage caused by torsion or spontaneous thrombosis of the epiploic appendage central draining vein. ?The incidence of epiploic appendagitis is not known. However, epiploic appendagitis has been reported in 2 to 7 percent of patients who were suspected of having diverticulitis and in 0.3 to 1 percent of patients suspected of having appendicitis. Epiploic appendagitis occurs most commonly in the second to fifth decades of life with a mean age at diagnosis of 40 years. The incidence of epiploic appendagitis is up to four times higher in men as compared with women. Obesity and strenuous exercise may be risk factors for the development of epiploic appendagitis.  ?Epiploic appendagitis is usually caused by acute torsion, which occurs when the appendage is abnormally long and large. Acute torsion causes ischemia and infarction with aseptic fat necrosis and spontaneous venous thrombosis. Gradual torsion of the appendages can result in chronic inflammation with minimal or no symptoms. ?Patients most commonly present with acute or subacute lower abdominal pain. Less frequent symptoms include postprandial fullness, early satiety, vomiting, bloating, diarrhea, and low-grade fever. The white blood count with differential, erythrocyte sedimentation rate, and C-reactive protein are normal or mildly elevated. ?The differential diagnosis of epiploic appendagitis consists of other causes of acute onset of lower abdominal pain. Of these, epiploic appendagitis is most often confused with acute diverticulitis and acute appendicitis. These conditions can usually be  differentiated by the clinical presentation and by abdominal imaging (eg, abdominal computed tomography [CT] scan).  ?Epiploic appendagitis is usually diagnosed incidentally in patients undergoing imaging for acute onset of lower abdominal pain. Abdominal CT scan is diagnostic. The classic finding is a 2 to 3 cm, oval-shaped, fat density, paracolic mass with thickened peritoneal lining and periappendageal fat stranding. A high-attenuated central dot within the inflamed appendage that corresponds to a thrombosed draining appendageal vein is occasionally evident.  ?We recommend initial conservative management rather than surgery in patients with epiploic appendagitis confirmed by abdominal imaging. We typically treat patients with oral anti-inflammatory medications (eg, ibuprofen 600 mg PO every eight hours for four to six days) and if needed a short course of opiates (acetaminophen/codeine 300/30 every six hours) for four to seven days. We reserve surgical management for patients whose symptoms fail to improve with conservative management, those with new or worsening symptoms (eg, high fever, progressive pain, nausea, vomiting, or inability to tolerate an oral diet), or complications (eg, intussusception, bowel obstruction, abscess) that cannot be managed nonoperatively. ?Epiploic appendagitis is a benign and self-limiting condition. Complete resolution without surgical intervention usually occurs between 3 to 14 days. Complications (eg, intussusception, bowel obstruction, abscess) are rare.

## 2018-02-20 NOTE — ED Triage Notes (Signed)
LLQ pain since yesterday, no diarrhea or n/v

## 2018-04-27 ENCOUNTER — Other Ambulatory Visit: Payer: Self-pay | Admitting: Cardiology

## 2018-05-26 DIAGNOSIS — K219 Gastro-esophageal reflux disease without esophagitis: Secondary | ICD-10-CM | POA: Diagnosis not present

## 2018-05-26 DIAGNOSIS — Z86010 Personal history of colonic polyps: Secondary | ICD-10-CM | POA: Diagnosis not present

## 2018-06-11 ENCOUNTER — Other Ambulatory Visit: Payer: Self-pay | Admitting: Cardiology

## 2018-08-07 ENCOUNTER — Other Ambulatory Visit: Payer: Self-pay | Admitting: Cardiology

## 2018-10-10 NOTE — Progress Notes (Signed)
Mark [name] Date of Birth: 1961/04/13 Medical Record #[id number]  History of Present Illness: Mark [name] is seen for follow up Atrial fibrillation and CAD. He has HLD, CAD, HTN, SVT with remote ablation in 2008 per Dr. Ladona Ridgel and prior history of tobacco abuse. Had STEMI in May of 2012 and was treated with BMS to the 3rd OM.  Follow up Myoview in June of 2012 was satisfactory. In April 2015 Myoview was intermediate risk. He underwent cardiac cath which showed severe stenosis in the proximal LAD. He had successful DES of the LAD. He was intolerant of Brilinta due to dyspnea and was switched to Effient.   In December 2015 he was admitted with new atrial fibrillation with RVR. Labs were normal. He converted spontaneously. Echo was normal. He was placed on Eliquis and Plavix and ASA were stopped. He was started on diltiazem. He later developed increased symptoms of palpitations with recurrent Afib. In February 2017 he underwent AFib ablation with Dr. Elberta Fortis with excellent results.   On follow up today he reports he is doing very well. No palpitations.   Denies any chest pain, SOB, or dizziness.  He is not smoking. He remains very active with no limitations. Notes he has a colonoscopy planned with Dr. Dulce Sellar.   Current Outpatient Medications  Medication Sig Dispense Refill  . atorvastatin (LIPITOR) 80 MG tablet TAKE 0.5 TABLETS (40 MG TOTAL) BY MOUTH DAILY. 45 tablet 3  . ELIQUIS 5 MG TABS tablet TAKE 1 TABLET (5 MG TOTAL) BY MOUTH 2 (TWO) TIMES DAILY. 60 tablet 4  . metoprolol tartrate (LOPRESSOR) 50 MG tablet Take 1 tablet (50 mg total) by mouth 2 (two) times daily. 60 tablet 6  . Omega-3 Fatty Acids (FISH OIL) 1000 MG CAPS Take 2 to 4 grams daily 100 capsule 6  . omeprazole (PRILOSEC) 40 MG capsule Take 40 mg by mouth daily.   6  . nitroGLYCERIN (NITROSTAT) 0.4 MG SL tablet PLACE 1 TABLET UNDER TONGUE EVERY 5 MINS AS NEEDED FOR CHEST PAIN, ON 3RD DOSE CALL 911 (Patient not taking: Reported on  10/20/2018) 25 tablet 1   No current facility-administered medications for this visit.     Allergies  Allergen Reactions  . Penicillins     Passed out and got weak  Has patient had a PCN reaction causing immediate rash, facial/tongue/throat swelling, SOB or lightheadedness with hypotension: Yes Has patient had a PCN reaction causing severe rash involving mucus membranes or skin necrosis: No Has patient had a PCN reaction that required hospitalization No Has patient had a PCN reaction occurring within the last 10 years: No If all of the above answers are "NO", then may proceed with Cephalosporin use.    Past Medical History:  Diagnosis Date  . Atrial fibrillation (HCC)   . CAD (coronary artery disease)   . Hyperlipidemia   . Hypertension   . S/P coronary artery stent placement 04/23/11   BMS to the 3rd OM; has residual disease  . STEMI (ST elevation myocardial infarction) (HCC) 04/23/11  . SVT (supraventricular tachycardia) (HCC)   . Tobacco abuse     Past Surgical History:  Procedure Laterality Date  . CORONARY STENT PLACEMENT  04/23/11   BMS to the 3rd OM  . ELECTROPHYSIOLOGIC STUDY N/A 01/25/2016   Procedure: Atrial Fibrillation Ablation;  Surgeon: Will [name], MD;  Location: [facility] INVASIVE [facility] LAB;  Service: Cardiovascular;  Laterality: N/A;  . LEFT HEART CATHETERIZATION WITH CORONARY ANGIOGRAM N/A 04/13/2014   Procedure: LEFT HEART CATHETERIZATION  WITH CORONARY ANGIOGRAM;  Surgeon: Nochum Fenter M Swaz[place], MD;  Location: [facility] CATH LAB;  Service: Cardiovascular;  Laterality: N/A;  . PERCUTANEOUS CORONARY STENT INTERVENTION (PCI-S) Right 04/13/2014   Procedure: PERCUTANEOUS CORONARY STENT INTERVENTION (PCI-S);  Surgeon: Kenecia Barren M [place], MD;  Location: [facility] CATH LAB;  Service: Cardiovascular;  Laterality: Right;  . SVT ABLATION   2008   Dr. Ladona Ridgel    Social History   Tobacco Use  Smoking Status Former Smoker  . Last attempt to quit: 04/23/2011  . Years since quitting: 7.4  Smokeless  Tobacco Former Neurosurgeon  . Quit date: 04/23/2011    Social History   Substance and Sexual Activity  Alcohol Use No    Family History  Problem Relation Age of Onset  . Hypertension Mother     Review of Systems: The review of systems is per the HPI.  All other systems were reviewed and are negative.  Physical Exam: BP 122/80   Pulse 63   Ht 5\' 7"  (1.702 m)   Wt 162 lb 6.4 oz (73.7 kg)   BMI 25.44 kg/m  GENERAL:  Well appearing WM in NAD HEENT:  PERRL, EOMI, sclera are clear. Oropharynx is clear. NECK:  No jugular venous distention, carotid upstroke brisk and symmetric, no bruits, no thyromegaly or adenopathy LUNGS:  Clear to auscultation bilaterally CHEST:  Unremarkable HEART:  RRR,  PMI not displaced or sustained,S1 and S2 within normal limits, no S3, no S4: no clicks, no rubs, no murmurs ABD:  Soft, nontender. BS +, no masses or bruits. No hepatomegaly, no splenomegaly EXT:  2 + pulses throughout, no edema, no cyanosis no clubbing SKIN:  Warm and dry.  No rashes NEURO:  Alert and oriented x 3. Cranial nerves II through XII intact. PSYCH:  Cognitively intact      LABORATORY DATA: Lab Results  Component Value Date   WBC 11.4 (H) 02/20/2018   HGB 15.0 02/20/2018   HCT 45.0 02/20/2018   PLT 290 02/20/2018   GLUCOSE 144 (H) 02/20/2018   CHOL 138 12/23/2017   TRIG 239 (H) 12/23/2017   HDL 26 (L) 12/23/2017   LDLCALC 64 12/23/2017   ALT 34 02/20/2018   AST 30 02/20/2018   NA 138 02/20/2018   K 4.2 02/20/2018   CL 104 02/20/2018   CREATININE 0.81 02/20/2018   BUN 15 02/20/2018   CO2 25 02/20/2018   TSH 1.458 12/11/2014   INR 1.04 12/10/2014   HGBA1C (H) 04/23/2011    6.0 (NOTE)                                                                       According to the ADA Clinical Practice Recommendations for 2011, when HbA1c is used as a screening test:   >=6.5%   Diagnostic of Diabetes Mellitus           (if abnormal result  is confirmed)  5.7-6.4%   Increased risk of  developing Diabetes Mellitus  References:Diagnosis and Classification of Diabetes Mellitus,Diabetes Care,2011,34(Suppl 1):S62-S69 and Standards of Medical Care in         Diabetes - 2011,Diabetes Care,2011,34  (Suppl 1):S11-S61.   Ecg today shows NSR with rate 63. Normal Ecg. I have personally reviewed and interpreted this study.  Assessment / Plan: 1. CAD - remote BMS of the OM. S/p DES of LAD 04/13/14. He remains asymptomatic. Continue metoprolol.   2. Atrial fibrillation. Paroxysmal. S/p successful ablation in February 2017. Maintaining NSR. Continue eliquis and metoprolol.   3. HLD - on high dose statin therapy. LDL 64. At goal. Will repeat fasting lab in 6 months.  4. Tobacco abuse - he has quit  5. HTN-  Well controlled on medication.  6. History of colon polyps. He may safely stop Eliquis 48 hours prior to colonoscopy.

## 2018-10-20 ENCOUNTER — Encounter: Payer: Self-pay | Admitting: Cardiology

## 2018-10-20 ENCOUNTER — Ambulatory Visit: Payer: Federal, State, Local not specified - PPO | Admitting: Cardiology

## 2018-10-20 VITALS — BP 122/80 | HR 63 | Ht 67.0 in | Wt 162.4 lb

## 2018-10-20 DIAGNOSIS — I10 Essential (primary) hypertension: Secondary | ICD-10-CM

## 2018-10-20 DIAGNOSIS — I480 Paroxysmal atrial fibrillation: Secondary | ICD-10-CM

## 2018-10-20 DIAGNOSIS — I2510 Atherosclerotic heart disease of native coronary artery without angina pectoris: Secondary | ICD-10-CM

## 2018-10-20 DIAGNOSIS — E7800 Pure hypercholesterolemia, unspecified: Secondary | ICD-10-CM | POA: Diagnosis not present

## 2018-10-20 NOTE — Patient Instructions (Addendum)
Continue your current therapy  Follow up in 6 months with fasting lab

## 2018-11-11 DIAGNOSIS — K219 Gastro-esophageal reflux disease without esophagitis: Secondary | ICD-10-CM | POA: Diagnosis not present

## 2018-11-16 ENCOUNTER — Telehealth: Payer: Self-pay

## 2018-11-16 NOTE — Telephone Encounter (Signed)
   [facility] Pre-operative Risk Assessment    Request for surgical clearance:  1. What type of surgery is being performed? COLONOSCOPY/ENDOSCOPY   2. When is this surgery scheduled? 12/02/18  3. What type of clearance is required (medical clearance vs. Pharmacy clearance to hold med vs. Both)? BOTH  4. Are there any medications that need to be held prior to surgery and how long? ELIQUIS  5. Practice name and name of physician performing surgery? [facility] (DR. OUTLAW)  6. What is your office phone number [phone]   7.   What is your office fax number [phone]  8.   Anesthesia type (None, local, MAC, general) ? UNKNOWN   Haniyyah Sakuma O Zinedine Ellner 11/16/2018, 2:47 PM  _________________________________________________________________   (provider comments below)

## 2018-11-17 NOTE — Telephone Encounter (Signed)
Dr. SwazilandJordan addressed the clearance at his last visit.   Will route this note to requesting provider.   Rosalio MacadamiaLori C. Heywood Tokunaga, RN, ANP-C [facility] [address] [place], [place]  [id number] [phone]

## 2018-11-27 ENCOUNTER — Other Ambulatory Visit: Payer: Self-pay | Admitting: Cardiology

## 2018-11-27 DIAGNOSIS — I2510 Atherosclerotic heart disease of native coronary artery without angina pectoris: Secondary | ICD-10-CM

## 2018-12-02 DIAGNOSIS — K3189 Other diseases of stomach and duodenum: Secondary | ICD-10-CM | POA: Diagnosis not present

## 2018-12-02 DIAGNOSIS — K2970 Gastritis, unspecified, without bleeding: Secondary | ICD-10-CM | POA: Diagnosis not present

## 2018-12-02 DIAGNOSIS — Z86010 Personal history of colonic polyps: Secondary | ICD-10-CM | POA: Diagnosis not present

## 2019-01-05 ENCOUNTER — Other Ambulatory Visit: Payer: Self-pay | Admitting: Cardiology

## 2019-03-18 ENCOUNTER — Telehealth (INDEPENDENT_AMBULATORY_CARE_PROVIDER_SITE_OTHER): Payer: BLUE CROSS/BLUE SHIELD | Admitting: Cardiology

## 2019-03-18 ENCOUNTER — Telehealth: Payer: BLUE CROSS/BLUE SHIELD | Admitting: Cardiology

## 2019-03-18 ENCOUNTER — Other Ambulatory Visit: Payer: Self-pay

## 2019-03-18 ENCOUNTER — Encounter: Payer: Self-pay | Admitting: Cardiology

## 2019-03-18 ENCOUNTER — Ambulatory Visit: Payer: BLUE CROSS/BLUE SHIELD | Admitting: Cardiology

## 2019-03-18 ENCOUNTER — Telehealth: Payer: Self-pay | Admitting: Cardiology

## 2019-03-18 VITALS — BP 116/77 | HR 74

## 2019-03-18 DIAGNOSIS — I480 Paroxysmal atrial fibrillation: Secondary | ICD-10-CM

## 2019-03-18 DIAGNOSIS — R0789 Other chest pain: Secondary | ICD-10-CM

## 2019-03-18 DIAGNOSIS — I2510 Atherosclerotic heart disease of native coronary artery without angina pectoris: Secondary | ICD-10-CM

## 2019-03-18 NOTE — Progress Notes (Signed)
Virtual Visit via Telephone Note    Evaluation Performed:  Evaluation of chest pain  This visit type was conducted due to national recommendations for restrictions regarding the COVID-19 Pandemic (e.g. social distancing).  This format is felt to be most appropriate for this patient at this time.  All issues noted in this document were discussed and addressed.  No physical exam was performed (except for noted visual exam findings with Video Visits).  Please refer to the patient's chart (MyChart message for video visits and phone note for telephone visits) for the patient's consent to telehealth for [facility].  Date:  03/18/2019   ID:  Mark [name], DOB Feb 09, 1961, MRN [id number]  Patient Location:  home  Provider location:   office  PCP:  Georgianne Fick, MD  Cardiologist:   [place], MD  Electrophysiologist:  None   Chief Complaint:  Chest pain  History of Present Illness:    Mark [name] is a 58 y.o. male who presents via audio/video conferencing for a telehealth visit today.  He has HLD, CAD, HTN, SVT with remote ablation in 2008 per Dr. Ladona Ridgel and prior history of tobacco abuse. Had STEMI in May of 2012 and was treated with BMS to the 3rd OM.  Follow up Myoview in June of 2012 was satisfactory. In April 2015 Myoview was intermediate risk. He underwent cardiac cath which showed severe stenosis in the proximal LAD. He had successful DES of the LAD. He was intolerant of Brilinta due to dyspnea and was switched to Effient.   In December 2015 he was admitted with new atrial fibrillation with RVR. Labs were normal. He converted spontaneously. Echo was normal. He was placed on Eliquis and Plavix and ASA were stopped. He was started on diltiazem. He later developed increased symptoms of palpitations with recurrent Afib. In February 2017 he underwent AFib ablation with Dr. Elberta Fortis with excellent results. On his last visit in November 2019 he was doing well without symptoms.    More recently he has noted some chest pain. This has been going on for a month. He feels a quick aching pain in his mid chest. It lasts only a few seconds. Hasn't had to take a Ntg. It is not related to activity and in fact notes it more at night when he is at rest. No other associated symptoms. Not really like his anginal pain. 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    The patient does not have symptoms concerning for COVID-19 infection (fever, chills, cough, or new shortness of breath).    Prior CV studies:   The following studies were reviewed today:  none  Past Medical History:  Diagnosis Date  . Atrial fibrillation (HCC)   . CAD (coronary artery disease)   . Hyperlipidemia   . Hypertension   . S/P coronary artery stent placement 04/23/11   BMS to the 3rd OM; has residual disease  . STEMI (ST elevation myocardial infarction) (HCC) 04/23/11  . SVT (supraventricular tachycardia) (HCC)   . Tobacco abuse    Past Surgical History:  Procedure Laterality Date  . CORONARY STENT PLACEMENT  04/23/11   BMS to the 3rd OM  . ELECTROPHYSIOLOGIC STUDY N/A 01/25/2016   Procedure: Atrial Fibrillation Ablation;  Surgeon: Will [name], MD;  Location: [facility] INVASIVE CV LAB;  Service: Cardiovascular;  Laterality: N/A;  . LEFT HEART CATHETERIZATION WITH CORONARY ANGIOGRAM N/A 04/13/2014   Procedure: LEFT HEART CATHETERIZATION WITH CORONARY ANGIOGRAM;  Surgeon:  M Swaz[place], MD;  Location: [facility] CATH LAB;  Service: Cardiovascular;  Laterality: N/A;  . PERCUTANEOUS CORONARY STENT INTERVENTION (PCI-S) Right 04/13/2014   Procedure: PERCUTANEOUS CORONARY STENT INTERVENTION (PCI-S);  Surgeon:  M [place], MD;  Location: [facility] CATH LAB;  Service: Cardiovascular;  Laterality: Right;  . SVT ABLATION   2008   Dr. Ladona Ridgel     Current Meds  Medication Sig  . atorvastatin (LIPITOR) 80 MG tablet TAKE 0.5 TABLETS (40 MG TOTAL) BY MOUTH DAILY.  Marland Kitchen ELIQUIS 5 MG  TABS tablet TAKE 1 TABLET (5 MG TOTAL) BY MOUTH 2 (TWO) TIMES DAILY.  . metoprolol tartrate (LOPRESSOR) 50 MG tablet TAKE 1 TABLET BY MOUTH TWICE A DAY  . Omega-3 Fatty Acids (FISH OIL) 1000 MG CAPS Take 2 to 4 grams daily  . omeprazole (PRILOSEC) 40 MG capsule Take 40 mg by mouth daily.      Allergies:   Penicillins   Social History   Tobacco Use  . Smoking status: Former Smoker    Last attempt to quit: 04/23/2011    Years since quitting: 7.9  . Smokeless tobacco: Former Neurosurgeon    Quit date: 04/23/2011  Substance Use Topics  . Alcohol use: No  . Drug use: No     Family Hx: The patient's family history includes Hypertension in his mother.  ROS:   Please see the history of present illness.    none All other systems reviewed and are negative.   Labs/Other Tests and Data Reviewed:    Recent Labs: No results found for requested labs within last 8760 hours.   Recent Lipid Panel Lab Results  Component Value Date/Time   CHOL 138 12/23/2017 09:33 AM   TRIG 239 (H) 12/23/2017 09:33 AM   HDL 26 (L) 12/23/2017 09:33 AM   CHOLHDL 5.3 (H) 12/23/2017 09:33 AM   CHOLHDL 5.8 (H) 12/13/2016 08:56 AM   LDLCALC 64 12/23/2017 09:33 AM    Wt Readings from Last 3 Encounters:  10/20/18 162 lb 6.4 oz (73.7 kg)  02/20/18 164 lb (74.4 kg)  12/30/17 161 lb 6.4 oz (73.2 kg)     Objective:    Vital Signs:  BP 116/77   Pulse 74  per patient report    ASSESSMENT & PLAN:    1. Atypical chest pain. Does not sound anginal in nature. Will continue current medications. He is to notify me if symptoms progress in duration, severity, or frequency.   2. CAD - remote BMS of the OM. S/p DES of LAD 04/13/14.  Continue metoprolol.   3. Atrial fibrillation. Paroxysmal. S/p successful ablation in February 2017. Maintaining NSR. Patient reports pulse is regular. Continue eliquis and metoprolol.   4. HTN-  Well controlled on medication.   COVID-19 Education: The signs and symptoms of COVID-19 were  discussed with the patient and how to seek care for testing (follow up with PCP or arrange E-visit).  The importance of social distancing was discussed today.  Patient Risk:   After full review of this patient's clinical status, I feel that they are at least moderate risk at this time.  Time:   Today, I have spent 15 minutes with the patient with telehealth technology discussing his chest pain symptoms.     Medication Adjustments/Labs and Tests Ordered: Current medicines are reviewed at length with the patient today.  Concerns regarding medicines are outlined above.  Tests Ordered: No orders of the defined types were placed in this encounter.  Medication Changes: No orders of  the defined types were placed in this encounter.   Disposition:  Follow up in a few months.  Signed,  [place], MD  03/18/2019 4:05 PM    [facility]

## 2019-03-18 NOTE — Telephone Encounter (Signed)
Spoke to patient's wife husband scheduled for a tele visit with Dr.Jordan this morning at 11:40 am.

## 2019-03-18 NOTE — Telephone Encounter (Signed)
New Message   Pt c/o of Chest Pain: STAT if CP now or developed within 24 hours  1. Are you having CP right now? No   2. Are you experiencing any other symptoms (ex. SOB, nausea, vomiting, sweating)? No   3. How long have you been experiencing CP? For several weeks  4. Is your CP continuous or coming and going? Coming and going   5. Have you taken Nitroglycerin? No   Patients wife is calling on his behalf. She states that the chest pain is not constant and comes and goes. He is not due to see Dr. Swaziland until May but would like to know if he can be seen sooner.  ?

## 2019-03-18 NOTE — Patient Instructions (Signed)
Continue to monitor symptoms and let us know if they progress

## 2019-03-18 NOTE — Telephone Encounter (Signed)
Called patient, spoke with wife. She states that for the past three weeks her husband has complained of having chest pains. She tried to get him to call to be seen but patient did not want to at that time. Yesterday he came home with chest pain and asked her to call the office- as they feel it is worsening. It does come and go and is not constant. Unable to check BP at this time- or HR. Denies swelling, sob, or arm pain. Patient wife states he has not taken a nitroglycerin- but does take his other medication on a daily basis as prescribed.   Patient would like to know if he can come in sooner than his appointment in May. I advised I would route this message to MD and nurse for recommendations.

## 2019-04-22 ENCOUNTER — Telehealth: Payer: Self-pay | Admitting: Cardiology

## 2019-04-22 NOTE — Telephone Encounter (Signed)
Returned call to patient's wife no answer.LMTC. 

## 2019-04-22 NOTE — Telephone Encounter (Signed)
  Patient's wife is calling to return a call about moving Mr Mark [name] appt to another day. I am not sure if there is a specific day they wanted to move him to.

## 2019-04-23 NOTE — Telephone Encounter (Signed)
Called patient wife- she states she was told the appointment needed to be moved.  I advised with patient I was unsure of how they were conducting his schedule- as last I heard Dr.Jordan was not feeling well and they were moving patients off his schedule.  Advised I would route a message to Elnita Maxwell, advised she was off today- but would return Monday morning.  Patient wife, verbalized understanding.

## 2019-04-23 NOTE — Telephone Encounter (Signed)
F/U Message              Patient's wife is returning a call again to get a new appt.

## 2019-04-27 ENCOUNTER — Telehealth: Payer: Federal, State, Local not specified - PPO | Admitting: Cardiology

## 2019-04-30 NOTE — Telephone Encounter (Signed)
Spoke to patient's wife.She stated husband is doing good.He has not had anymore chest pressure.Stated he would like appt with Dr.Jordan in Oct.Advised when Oct schedule opens we will call back to schedule.Stated he would like a early morning Tue appt.

## 2019-06-05 ENCOUNTER — Other Ambulatory Visit: Payer: Self-pay | Admitting: Cardiology

## 2019-06-29 DIAGNOSIS — K219 Gastro-esophageal reflux disease without esophagitis: Secondary | ICD-10-CM | POA: Diagnosis not present

## 2019-07-01 ENCOUNTER — Other Ambulatory Visit: Payer: Self-pay | Admitting: Cardiology

## 2019-07-01 NOTE — Telephone Encounter (Signed)
57yom, scr 0.81(02/20/18 outdated), 73.7kg, lovw/jordan (10/20/18)

## 2019-07-24 ENCOUNTER — Encounter

## 2019-07-26 ENCOUNTER — Other Ambulatory Visit: Payer: Self-pay | Admitting: Cardiology

## 2019-07-26 NOTE — Telephone Encounter (Signed)
Refill Request.  

## 2019-07-26 NOTE — Telephone Encounter (Signed)
Called and lmomed the pt regarding overdue labs... awaiting pt callback

## 2019-07-27 NOTE — Telephone Encounter (Signed)
Patient coming to complete lab today 07/27/2019

## 2019-07-28 DIAGNOSIS — I480 Paroxysmal atrial fibrillation: Secondary | ICD-10-CM | POA: Diagnosis not present

## 2019-07-28 DIAGNOSIS — I10 Essential (primary) hypertension: Secondary | ICD-10-CM | POA: Diagnosis not present

## 2019-07-28 DIAGNOSIS — I2510 Atherosclerotic heart disease of native coronary artery without angina pectoris: Secondary | ICD-10-CM | POA: Diagnosis not present

## 2019-07-28 DIAGNOSIS — E7800 Pure hypercholesterolemia, unspecified: Secondary | ICD-10-CM | POA: Diagnosis not present

## 2019-07-29 LAB — BASIC METABOLIC PANEL
BUN/Creatinine Ratio: 19 (ref 9–20)
BUN: 15 mg/dL (ref 6–24)
CO2: 24 mmol/L (ref 20–29)
Calcium: 9 mg/dL (ref 8.7–10.2)
Chloride: 105 mmol/L (ref 96–106)
Creatinine, Ser: 0.77 mg/dL (ref 0.76–1.27)
GFR calc Af Amer: 116 mL/min/{1.73_m2} (ref >59)
GFR calc non Af Amer: 100 mL/min/{1.73_m2} (ref >59)
Glucose: 75 mg/dL (ref 65–99)
Potassium: 4.2 mmol/L (ref 3.5–5.2)
Sodium: 144 mmol/L (ref 134–144)

## 2019-07-29 LAB — CBC WITH DIFFERENTIAL/PLATELET
Basophils Absolute: 0 10*3/uL (ref 0.0–0.2)
Basos: 0 %
EOS (ABSOLUTE): 0.1 10*3/uL (ref 0.0–0.4)
Eos: 1 %
Hematocrit: 43.3 % (ref 37.5–51.0)
Hemoglobin: 15 g/dL (ref 13.0–17.7)
Immature Grans (Abs): 0 10*3/uL (ref 0.0–0.1)
Immature Granulocytes: 0 %
Lymphocytes Absolute: 1.4 10*3/uL (ref 0.7–3.1)
Lymphs: 23 %
MCH: 30.1 pg (ref 26.6–33.0)
MCHC: 34.6 g/dL (ref 31.5–35.7)
MCV: 87 fL (ref 79–97)
Monocytes Absolute: 0.5 10*3/uL (ref 0.1–0.9)
Monocytes: 8 %
Neutrophils Absolute: 4 10*3/uL (ref 1.4–7.0)
Neutrophils: 68 %
Platelets: 255 10*3/uL (ref 150–450)
RBC: 4.98 x10E6/uL (ref 4.14–5.80)
RDW: 13.2 % (ref 11.6–15.4)
WBC: 6 10*3/uL (ref 3.4–10.8)

## 2019-07-29 LAB — HEPATIC FUNCTION PANEL
ALT: 47 IU/L — ABNORMAL HIGH (ref 0–44)
AST: 26 IU/L (ref 0–40)
Albumin: 4.3 g/dL (ref 3.8–4.9)
Alkaline Phosphatase: 82 IU/L (ref 39–117)
Bilirubin Total: 1 mg/dL (ref 0.0–1.2)
Bilirubin, Direct: 0.26 mg/dL (ref 0.00–0.40)
Total Protein: 6.9 g/dL (ref 6.0–8.5)

## 2019-07-29 LAB — LIPID PANEL W/O CHOL/HDL RATIO
Cholesterol, Total: 123 mg/dL (ref 100–199)
HDL: 28 mg/dL — ABNORMAL LOW (ref >39)
LDL Calculated: 45 mg/dL (ref 0–99)
Triglycerides: 252 mg/dL — ABNORMAL HIGH (ref 0–149)
VLDL Cholesterol Cal: 50 mg/dL — ABNORMAL HIGH (ref 5–40)

## 2019-08-02 ENCOUNTER — Telehealth: Payer: Self-pay | Admitting: Cardiology

## 2019-08-02 ENCOUNTER — Other Ambulatory Visit: Payer: Self-pay

## 2019-08-02 MED ORDER — APIXABAN 5 MG PO TABS: 5.0000 mg | ORAL_TABLET | Freq: Two times a day (BID) | ORAL | 6 refills | Status: DC

## 2019-08-02 MED ORDER — VASCEPA 1 G PO CAPS: 2.0000 | ORAL_CAPSULE | Freq: Two times a day (BID) | ORAL | 6 refills | Status: DC

## 2019-08-02 NOTE — Telephone Encounter (Signed)
Yes should be on 4 grams daily of fish oil  Peter Martinique MD, [facility]

## 2019-08-02 NOTE — Telephone Encounter (Signed)
Follow up:     Patient wife returning your call.

## 2019-08-02 NOTE — Telephone Encounter (Signed)
Returned call to patient's wife lab results given. 

## 2019-08-02 NOTE — Telephone Encounter (Signed)
Returned call to patient's wife she stated husband's insurance will not cover Vascepa.She is also concerned she read side effects and it can cause atrial fib.She wanted to ask Dr.Jordan if ok to increase current fish oil capsules.Message sent to [place] for advice.

## 2019-08-02 NOTE — Telephone Encounter (Signed)
New Message   Patient's wife calling in for lab results.

## 2019-08-02 NOTE — Telephone Encounter (Signed)
89m 73.7kg Scr 0.77 07/28/19 Lovw/jordan 03/18/19

## 2019-08-05 NOTE — Telephone Encounter (Signed)
Spoke to patient's wife Dr.Jordan's recommendation given. 

## 2019-08-05 NOTE — Addendum Note (Signed)
Addended by: Kathyrn Lass on: 08/05/2019 06:04 PM   Modules accepted: Orders

## 2019-09-29 NOTE — Progress Notes (Signed)
Mark SproutJeffery Crosson Date of Birth: 1961-07-28 Medical Record #[id number]  History of Present Illness: Mark [name] is seen for follow up Atrial fibrillation and CAD. He has HLD, CAD, HTN, SVT with remote ablation in 2008 per Dr. Ladona Ridgelaylor and prior history of tobacco abuse. Had STEMI in May of 2012 and was treated with BMS to the 3rd OM.  Follow up Myoview in June of 2012 was satisfactory. In April 2015 Myoview was intermediate risk. He underwent cardiac cath which showed severe stenosis in the proximal LAD. He had successful DES of the LAD. He was intolerant of Brilinta due to dyspnea and was switched to Effient.   In December 2015 he was admitted with new atrial fibrillation with RVR. Labs were normal. He converted spontaneously. Echo was normal. He was placed on Eliquis.  Plavix and ASA were stopped. He was started on diltiazem. He later developed increased symptoms of palpitations with recurrent Afib. In February 2017 he underwent AFib ablation with Dr. Elberta Fortisamnitz with excellent results.   When last seen in April he had some atypical chest pain that was not felt to be anginal.   On follow up today he reports he is doing very well. No palpitations.   Denies any chest pain, SOB, or dizziness.  He is not smoking. He remains very active with no limitations. He did have panendoscopy last year with Dr Dulce Sellarutlaw and this was normal  Current Outpatient Medications  Medication Sig Dispense Refill  . apixaban (ELIQUIS) 5 MG TABS tablet Take 1 tablet (5 mg total) by mouth 2 (two) times daily. 60 tablet 6  . atorvastatin (LIPITOR) 80 MG tablet TAKE 0.5 TABLETS (40 MG TOTAL) BY MOUTH DAILY. 45 tablet 3  . metoprolol tartrate (LOPRESSOR) 50 MG tablet TAKE 1 TABLET BY MOUTH TWICE A DAY 180 tablet 3  . nitroGLYCERIN (NITROSTAT) 0.4 MG SL tablet PLACE 1 TABLET UNDER TONGUE EVERY 5 MINS AS NEEDED FOR CHEST PAIN, ON 3RD DOSE CALL 911 (Patient not taking: Reported on 10/20/2018) 25 tablet 1  . Omega-3 Fatty Acids (FISH OIL) 1000 MG  CAPS Take 2 capsules 2 gms twice a day 100 capsule 6  . omeprazole (PRILOSEC) 40 MG capsule Take 40 mg by mouth daily.   6   No current facility-administered medications for this visit.     Allergies  Allergen Reactions  . Penicillins     Passed out and got weak  Has patient had a PCN reaction causing immediate rash, facial/tongue/throat swelling, SOB or lightheadedness with hypotension: Yes Has patient had a PCN reaction causing severe rash involving mucus membranes or skin necrosis: No Has patient had a PCN reaction that required hospitalization No Has patient had a PCN reaction occurring within the last 10 years: No If all of the above answers are "NO", then may proceed with Cephalosporin use.    Past Medical History:  Diagnosis Date  . Atrial fibrillation (HCC)   . CAD (coronary artery disease)   . Hyperlipidemia   . Hypertension   . S/P coronary artery stent placement 04/23/11   BMS to the 3rd OM; has residual disease  . STEMI (ST elevation myocardial infarction) (HCC) 04/23/11  . SVT (supraventricular tachycardia) (HCC)   . Tobacco abuse     Past Surgical History:  Procedure Laterality Date  . CORONARY STENT PLACEMENT  04/23/11   BMS to the 3rd OM  . ELECTROPHYSIOLOGIC STUDY N/A 01/25/2016   Procedure: Atrial Fibrillation Ablation;  Surgeon: Will Jorja LoaMartin Camnitz, MD;  Location: [facility];  Service: Cardiovascular;  Laterality: N/A;  . LEFT HEART CATHETERIZATION WITH CORONARY ANGIOGRAM N/A 04/13/2014   Procedure: LEFT HEART CATHETERIZATION WITH CORONARY ANGIOGRAM;  Surgeon: Peter M Martinique, MD;  Location: [facility] CATH LAB;  Service: Cardiovascular;  Laterality: N/A;  . PERCUTANEOUS CORONARY STENT INTERVENTION (PCI-S) Right 04/13/2014   Procedure: PERCUTANEOUS CORONARY STENT INTERVENTION (PCI-S);  Surgeon: Peter M Martinique, MD;  Location: [facility] CATH LAB;  Service: Cardiovascular;  Laterality: Right;  . SVT ABLATION   2008   Dr. Lovena Le    Social History   Tobacco Use  Smoking  Status Former Smoker  . Quit date: 04/23/2011  . Years since quitting: 8.4  Smokeless Tobacco Former Systems developer  . Quit date: 04/23/2011    Social History   Substance and Sexual Activity  Alcohol Use No    Family History  Problem Relation Age of Onset  . Hypertension Mother     Review of Systems: The review of systems is per the HPI.  All other systems were reviewed and are negative.  Physical Exam: BP 116/86   Pulse 66   Ht 5\' 7"  (1.702 m)   Wt 168 lb 6.4 oz (76.4 kg)   BMI 26.38 kg/m  GENERAL:  Well appearing WM in NAD HEENT:  PERRL, EOMI, sclera are clear. Oropharynx is clear. NECK:  No jugular venous distention, carotid upstroke brisk and symmetric, no bruits, no thyromegaly or adenopathy LUNGS:  Clear to auscultation bilaterally CHEST:  Unremarkable HEART:  RRR,  PMI not displaced or sustained,S1 and S2 within normal limits, no S3, no S4: no clicks, no rubs, no murmurs ABD:  Soft, nontender. BS +, no masses or bruits. No hepatomegaly, no splenomegaly EXT:  2 + pulses throughout, no edema, no cyanosis no clubbing SKIN:  Warm and dry.  No rashes NEURO:  Alert and oriented x 3. Cranial nerves II through XII intact. PSYCH:  Cognitively intact      LABORATORY DATA: Lab Results  Component Value Date   WBC 6.0 07/28/2019   HGB 15.0 07/28/2019   HCT 43.3 07/28/2019   PLT 255 07/28/2019   GLUCOSE 75 07/28/2019   CHOL 123 07/28/2019   TRIG 252 (H) 07/28/2019   HDL 28 (L) 07/28/2019   LDLCALC 45 07/28/2019   ALT 47 (H) 07/28/2019   AST 26 07/28/2019   NA 144 07/28/2019   K 4.2 07/28/2019   CL 105 07/28/2019   CREATININE 0.77 07/28/2019   BUN 15 07/28/2019   CO2 24 07/28/2019   TSH 1.458 12/11/2014   INR 1.04 12/10/2014   HGBA1C (H) 04/23/2011    6.0 (NOTE)                                                                       According to the ADA Clinical Practice Recommendations for 2011, when HbA1c is used as a screening test:   >=6.5%   Diagnostic of Diabetes  [place]           (if abnormal result  is confirmed)  5.7-6.4%   Increased risk of developing Diabetes Mellitus  References:Diagnosis and Classification of Diabetes Mellitus,Diabetes FTDD,2202,54(YHCWC 1):S62-S69 and Standards of Medical Care in         Diabetes - 2011,Diabetes Care,2011,34  (Suppl 1):S11-S61.  Ecg today shows NSR with rate 66. Normal Ecg. I have personally reviewed and interpreted this study.  Assessment / Plan: 1. CAD - remote BMS of the OM in 2012. S/p DES of LAD 04/13/14. He remains asymptomatic. Continue metoprolol.   2. Atrial fibrillation. Paroxysmal. S/p successful ablation in February 2017. Maintaining NSR. Continue eliquis and metoprolol.   3. HLD - on high dose statin therapy. LDL 45. At goal.   4. Tobacco abuse - he has quit years ago  5. HTN-  Well controlled on medication.   Follow up 6  months

## 2019-10-05 ENCOUNTER — Ambulatory Visit: Payer: Federal, State, Local not specified - PPO | Admitting: Cardiology

## 2019-10-05 ENCOUNTER — Encounter: Payer: Self-pay | Admitting: Cardiology

## 2019-10-05 ENCOUNTER — Other Ambulatory Visit: Payer: Self-pay

## 2019-10-05 VITALS — BP 116/86 | HR 66 | Ht 67.0 in | Wt 168.4 lb

## 2019-10-05 DIAGNOSIS — I10 Essential (primary) hypertension: Secondary | ICD-10-CM

## 2019-10-05 DIAGNOSIS — I480 Paroxysmal atrial fibrillation: Secondary | ICD-10-CM

## 2019-10-05 DIAGNOSIS — E782 Mixed hyperlipidemia: Secondary | ICD-10-CM | POA: Diagnosis not present

## 2019-10-05 DIAGNOSIS — I2510 Atherosclerotic heart disease of native coronary artery without angina pectoris: Secondary | ICD-10-CM

## 2019-12-04 ENCOUNTER — Other Ambulatory Visit: Payer: Self-pay | Admitting: Cardiology

## 2019-12-04 DIAGNOSIS — I2510 Atherosclerotic heart disease of native coronary artery without angina pectoris: Secondary | ICD-10-CM

## 2020-03-08 ENCOUNTER — Other Ambulatory Visit: Payer: Federal, State, Local not specified - PPO

## 2020-04-05 NOTE — Progress Notes (Signed)
Mark [name] Date of Birth: Nov 24, 1961 Medical Record #[id number]  History of Present Illness: Mark [name] is seen for follow up Atrial fibrillation and CAD. He has HLD, CAD, HTN, SVT with remote ablation in 2008 per Dr. Ladona Ridgel and prior history of tobacco abuse. Had STEMI in May of 2012 and was treated with BMS to the 3rd OM.  Follow up Myoview in June of 2012 was satisfactory. In April 2015 Myoview was intermediate risk. He underwent cardiac cath which showed severe stenosis in the proximal LAD. He had successful DES of the LAD. He was intolerant of Brilinta due to dyspnea and was switched to Effient.   In December 2015 he was admitted with new atrial fibrillation with RVR. Labs were normal. He converted spontaneously. Echo was normal. He was placed on Eliquis.  Plavix and ASA were stopped. He was started on diltiazem. He later developed increased symptoms of palpitations with recurrent Afib. In February 2017 he underwent AFib ablation with Dr. Elberta Fortis with excellent results.    On follow up today he reports he is doing very well. No palpitations.   Denies any chest pain, SOB, or dizziness.  He is not smoking. He states he is active at work. No bleeding.  Current Outpatient Medications  Medication Sig Dispense Refill  . apixaban (ELIQUIS) 5 MG TABS tablet Take 1 tablet (5 mg total) by mouth 2 (two) times daily. 180 tablet 3  . atorvastatin (LIPITOR) 80 MG tablet Take 0.5 tablets (40 mg total) by mouth daily. 45 tablet 3  . metoprolol tartrate (LOPRESSOR) 50 MG tablet TAKE 1 TABLET BY MOUTH TWICE A DAY 180 tablet 3  . nitroGLYCERIN (NITROSTAT) 0.4 MG SL tablet PLACE 1 TABLET UNDER TONGUE EVERY 5 MINS AS NEEDED FOR CHEST PAIN, ON 3RD DOSE CALL 911 25 tablet 1  . Omega-3 Fatty Acids (FISH OIL) 1000 MG CAPS Take 2 capsules 2 gms twice a day 100 capsule 6  . omeprazole (PRILOSEC) 40 MG capsule Take 40 mg by mouth daily.   6   No current facility-administered medications for this visit.    Allergies   Allergen Reactions  . Penicillins     Passed out and got weak  Has patient had a PCN reaction causing immediate rash, facial/tongue/throat swelling, SOB or lightheadedness with hypotension: Yes Has patient had a PCN reaction causing severe rash involving mucus membranes or skin necrosis: No Has patient had a PCN reaction that required hospitalization No Has patient had a PCN reaction occurring within the last 10 years: No If all of the above answers are "NO", then may proceed with Cephalosporin use.    Past Medical History:  Diagnosis Date  . Atrial fibrillation (HCC)   . CAD (coronary artery disease)   . Hyperlipidemia   . Hypertension   . S/P coronary artery stent placement 04/23/11   BMS to the 3rd OM; has residual disease  . STEMI (ST elevation myocardial infarction) (HCC) 04/23/11  . SVT (supraventricular tachycardia) (HCC)   . Tobacco abuse     Past Surgical History:  Procedure Laterality Date  . CORONARY STENT PLACEMENT  04/23/11   BMS to the 3rd OM  . ELECTROPHYSIOLOGIC STUDY N/A 01/25/2016   Procedure: Atrial Fibrillation Ablation;  Surgeon: Will [name], MD;  Location: [facility];  Service: Cardiovascular;  Laterality: N/A;  . LEFT HEART CATHETERIZATION WITH CORONARY ANGIOGRAM N/A 04/13/2014   Procedure: LEFT HEART CATHETERIZATION WITH CORONARY ANGIOGRAM;  Surgeon: Abbygale Lapid M Swaziland, MD;  Location: [facility] CATH LAB;  Service: Cardiovascular;  Laterality: N/A;  . PERCUTANEOUS CORONARY STENT INTERVENTION (PCI-S) Right 04/13/2014   Procedure: PERCUTANEOUS CORONARY STENT INTERVENTION (PCI-S);  Surgeon: Skylan Lara M Martinique, MD;  Location: [facility] CATH LAB;  Service: Cardiovascular;  Laterality: Right;  . SVT ABLATION   2008   Dr. Lovena Le    Social History   Tobacco Use  Smoking Status Former Smoker  . Quit date: 04/23/2011  . Years since quitting: 8.9  Smokeless Tobacco Former Systems developer  . Quit date: 04/23/2011    Social History   Substance and Sexual Activity  Alcohol Use No     Family History  Problem Relation Age of Onset  . Hypertension Mother     Review of Systems: The review of systems is per the HPI.  All other systems were reviewed and are negative.  Physical Exam: BP 128/80   Pulse 67   Temp (!) 97.2 F (36.2 C)   Ht 5\' 7"  (1.702 m)   Wt 166 lb 12.8 oz (75.7 kg)   SpO2 95%   BMI 26.12 kg/m  GENERAL:  Well appearing WM in NAD HEENT:  PERRL, EOMI, sclera are clear. Oropharynx is clear. NECK:  No jugular venous distention, carotid upstroke brisk and symmetric, no bruits, no thyromegaly or adenopathy LUNGS:  Clear to auscultation bilaterally CHEST:  Unremarkable HEART:  RRR,  PMI not displaced or sustained,S1 and S2 within normal limits, no S3, no S4: no clicks, no rubs, no murmurs ABD:  Soft, nontender. BS +, no masses or bruits. No hepatomegaly, no splenomegaly EXT:  2 + pulses throughout, no edema, no cyanosis no clubbing SKIN:  Warm and dry.  No rashes NEURO:  Alert and oriented x 3. Cranial nerves II through XII intact. PSYCH:  Cognitively intact   LABORATORY DATA: Lab Results  Component Value Date   WBC 6.0 07/28/2019   HGB 15.0 07/28/2019   HCT 43.3 07/28/2019   PLT 255 07/28/2019   GLUCOSE 75 07/28/2019   CHOL 123 07/28/2019   TRIG 252 (H) 07/28/2019   HDL 28 (L) 07/28/2019   LDLCALC 45 07/28/2019   ALT 47 (H) 07/28/2019   AST 26 07/28/2019   NA 144 07/28/2019   K 4.2 07/28/2019   CL 105 07/28/2019   CREATININE 0.77 07/28/2019   BUN 15 07/28/2019   CO2 24 07/28/2019   TSH 1.458 12/11/2014   INR 1.04 12/10/2014   HGBA1C (H) 04/23/2011    6.0 (NOTE)                                                                       According to the ADA Clinical Practice Recommendations for 2011, when HbA1c is used as a screening test:   >=6.5%   Diagnostic of Diabetes Mellitus           (if abnormal result  is confirmed)  5.7-6.4%   Increased risk of developing Diabetes Mellitus  References:Diagnosis and Classification of Diabetes  [place],Diabetes JSEG,3151,76(HYWVP 1):S62-S69 and Standards of Medical Care in         Diabetes - 2011,Diabetes Care,2011,34  (Suppl 1):S11-S61.     Assessment / Plan: 1. CAD - remote BMS of the OM in 2012. S/p DES of LAD 04/13/14. He remains asymptomatic. Continue metoprolol. Not  on antiplatelet therapy due to being on Eliquis.  2. Atrial fibrillation. Paroxysmal. S/p successful ablation in February 2017. Maintaining NSR. Continue eliquis and metoprolol.   3. HLD - on high dose statin therapy. Will update labs today.  4. Tobacco abuse - he has quit years ago  5. HTN-  Well controlled on medication.   Follow up 6  months

## 2020-04-13 ENCOUNTER — Encounter: Payer: Self-pay | Admitting: Cardiology

## 2020-04-13 ENCOUNTER — Other Ambulatory Visit: Payer: Self-pay

## 2020-04-13 ENCOUNTER — Ambulatory Visit (INDEPENDENT_AMBULATORY_CARE_PROVIDER_SITE_OTHER): Payer: Federal, State, Local not specified - PPO | Admitting: Cardiology

## 2020-04-13 VITALS — BP 128/80 | HR 67 | Temp 97.2°F | Ht 67.0 in | Wt 166.8 lb

## 2020-04-13 DIAGNOSIS — I2510 Atherosclerotic heart disease of native coronary artery without angina pectoris: Secondary | ICD-10-CM | POA: Diagnosis not present

## 2020-04-13 DIAGNOSIS — I10 Essential (primary) hypertension: Secondary | ICD-10-CM | POA: Diagnosis not present

## 2020-04-13 DIAGNOSIS — E782 Mixed hyperlipidemia: Secondary | ICD-10-CM | POA: Diagnosis not present

## 2020-04-13 DIAGNOSIS — I480 Paroxysmal atrial fibrillation: Secondary | ICD-10-CM | POA: Diagnosis not present

## 2020-04-13 LAB — BASIC METABOLIC PANEL
BUN/Creatinine Ratio: 23 — ABNORMAL HIGH (ref 9–20)
BUN: 19 mg/dL (ref 6–24)
CO2: 23 mmol/L (ref 20–29)
Calcium: 9.3 mg/dL (ref 8.7–10.2)
Chloride: 105 mmol/L (ref 96–106)
Creatinine, Ser: 0.82 mg/dL (ref 0.76–1.27)
GFR calc Af Amer: 112 mL/min/{1.73_m2} (ref >59)
GFR calc non Af Amer: 97 mL/min/{1.73_m2} (ref >59)
Glucose: 109 mg/dL — ABNORMAL HIGH (ref 65–99)
Potassium: 4.8 mmol/L (ref 3.5–5.2)
Sodium: 142 mmol/L (ref 134–144)

## 2020-04-13 LAB — CBC WITH DIFFERENTIAL/PLATELET
Basophils Absolute: 0 10*3/uL (ref 0.0–0.2)
Basos: 0 %
EOS (ABSOLUTE): 0.1 10*3/uL (ref 0.0–0.4)
Eos: 1 %
Hematocrit: 45.9 % (ref 37.5–51.0)
Hemoglobin: 15.5 g/dL (ref 13.0–17.7)
Immature Grans (Abs): 0 10*3/uL (ref 0.0–0.1)
Immature Granulocytes: 0 %
Lymphocytes Absolute: 1.7 10*3/uL (ref 0.7–3.1)
Lymphs: 21 %
MCH: 29.8 pg (ref 26.6–33.0)
MCHC: 33.8 g/dL (ref 31.5–35.7)
MCV: 88 fL (ref 79–97)
Monocytes Absolute: 0.7 10*3/uL (ref 0.1–0.9)
Monocytes: 8 %
Neutrophils Absolute: 5.6 10*3/uL (ref 1.4–7.0)
Neutrophils: 70 %
Platelets: 259 10*3/uL (ref 150–450)
RBC: 5.2 x10E6/uL (ref 4.14–5.80)
RDW: 13.1 % (ref 11.6–15.4)
WBC: 8.2 10*3/uL (ref 3.4–10.8)

## 2020-04-13 LAB — HEPATIC FUNCTION PANEL
ALT: 45 IU/L — ABNORMAL HIGH (ref 0–44)
AST: 24 IU/L (ref 0–40)
Albumin: 4.3 g/dL (ref 3.8–4.9)
Alkaline Phosphatase: 74 IU/L (ref 39–117)
Bilirubin Total: 0.5 mg/dL (ref 0.0–1.2)
Bilirubin, Direct: 0.15 mg/dL (ref 0.00–0.40)
Total Protein: 6.8 g/dL (ref 6.0–8.5)

## 2020-04-13 LAB — LIPID PANEL
Chol/HDL Ratio: 4.3 ratio (ref 0.0–5.0)
Cholesterol, Total: 136 mg/dL (ref 100–199)
HDL: 32 mg/dL — ABNORMAL LOW (ref >39)
LDL Chol Calc (NIH): 74 mg/dL (ref 0–99)
Triglycerides: 173 mg/dL — ABNORMAL HIGH (ref 0–149)
VLDL Cholesterol Cal: 30 mg/dL (ref 5–40)

## 2020-04-13 MED ORDER — ATORVASTATIN CALCIUM 80 MG PO TABS: 40.0000 mg | ORAL_TABLET | Freq: Every day | ORAL | 3 refills | Status: DC

## 2020-04-13 MED ORDER — APIXABAN 5 MG PO TABS: 5.0000 mg | ORAL_TABLET | Freq: Two times a day (BID) | ORAL | 3 refills | Status: DC

## 2020-04-13 NOTE — Addendum Note (Signed)
Addended by: Neoma Laming on: 04/13/2020 08:05 AM   Modules accepted: Orders

## 2020-09-21 DIAGNOSIS — K219 Gastro-esophageal reflux disease without esophagitis: Secondary | ICD-10-CM | POA: Diagnosis not present

## 2020-10-24 NOTE — Progress Notes (Signed)
Mark [name] Date of Birth: 1961-01-08 Medical Record #[id number]  History of Present Illness: Mark [name] is seen for follow up Atrial fibrillation and CAD. He has HLD, CAD, HTN, SVT with remote ablation in 2008 per Dr. Ladona Ridgel and prior history of tobacco abuse. Had STEMI in May of 2012 and was treated with BMS to the 3rd OM.  Follow up Myoview in June of 2012 was satisfactory. In April 2015 Myoview was intermediate risk. He underwent cardiac cath which showed severe stenosis in the proximal LAD. He had successful DES of the LAD. He was intolerant of Brilinta due to dyspnea and was switched to Effient.   In December 2015 he was admitted with new atrial fibrillation with RVR. Labs were normal. He converted spontaneously. Echo was normal. He was placed on Eliquis.  Plavix and ASA were stopped. He was started on diltiazem. He later developed increased symptoms of palpitations with recurrent Afib. In February 2017 he underwent AFib ablation with Dr. Elberta Fortis with excellent results.    On follow up today he reports he is doing very well. No palpitations.   Denies any chest pain, SOB, or dizziness.  He is not smoking. He states he is very active at work and does a lot of walking there. No bleeding.  Current Outpatient Medications  Medication Sig Dispense Refill  . apixaban (ELIQUIS) 5 MG TABS tablet Take 1 tablet (5 mg total) by mouth 2 (two) times daily. 180 tablet 3  . atorvastatin (LIPITOR) 80 MG tablet Take 0.5 tablets (40 mg total) by mouth daily. 45 tablet 3  . metoprolol tartrate (LOPRESSOR) 50 MG tablet TAKE 1 TABLET BY MOUTH TWICE A DAY 180 tablet 3  . nitroGLYCERIN (NITROSTAT) 0.4 MG SL tablet PLACE 1 TABLET UNDER TONGUE EVERY 5 MINS AS NEEDED FOR CHEST PAIN, ON 3RD DOSE CALL 911 25 tablet 1  . Omega-3 Fatty Acids (FISH OIL) 1000 MG CAPS Take 2 capsules 2 gms twice a day 100 capsule 6  . omeprazole (PRILOSEC) 40 MG capsule Take 40 mg by mouth daily.   6   No current facility-administered  medications for this visit.    Allergies  Allergen Reactions  . Penicillins     Passed out and got weak  Has patient had a PCN reaction causing immediate rash, facial/tongue/throat swelling, SOB or lightheadedness with hypotension: Yes Has patient had a PCN reaction causing severe rash involving mucus membranes or skin necrosis: No Has patient had a PCN reaction that required hospitalization No Has patient had a PCN reaction occurring within the last 10 years: No If all of the above answers are "NO", then may proceed with Cephalosporin use.    Past Medical History:  Diagnosis Date  . Atrial fibrillation (HCC)   . CAD (coronary artery disease)   . Hyperlipidemia   . Hypertension   . S/P coronary artery stent placement 04/23/11   BMS to the 3rd OM; has residual disease  . STEMI (ST elevation myocardial infarction) (HCC) 04/23/11  . SVT (supraventricular tachycardia) (HCC)   . Tobacco abuse     Past Surgical History:  Procedure Laterality Date  . CORONARY STENT PLACEMENT  04/23/11   BMS to the 3rd OM  . ELECTROPHYSIOLOGIC STUDY N/A 01/25/2016   Procedure: Atrial Fibrillation Ablation;  Surgeon: Will [name], MD;  Location: [facility] LAB;  Service: Cardiovascular;  Laterality: N/A;  . LEFT HEART CATHETERIZATION WITH CORONARY ANGIOGRAM N/A 04/13/2014   Procedure: LEFT HEART CATHETERIZATION WITH CORONARY ANGIOGRAM;  Surgeon: Demetria Pore  [place], MD;  Location: [facility] CATH LAB;  Service: Cardiovascular;  Laterality: N/A;  . PERCUTANEOUS CORONARY STENT INTERVENTION (PCI-S) Right 04/13/2014   Procedure: PERCUTANEOUS CORONARY STENT INTERVENTION (PCI-S);  Surgeon: Obdulia Steier M [place]az[place], MD;  Location: [facility] CATH LAB;  Service: Cardiovascular;  Laterality: Right;  . SVT ABLATION   2008   Dr. Ladona Ridgel    Social History   Tobacco Use  Smoking Status Former Smoker  . Quit date: 04/23/2011  . Years since quitting: 9.5  Smokeless Tobacco Former Neurosurgeon  . Quit date: 04/23/2011    Social History    Substance and Sexual Activity  Alcohol Use No    Family History  Problem Relation Age of Onset  . Hypertension Mother     Review of Systems: The review of systems is per the HPI.  All other systems were reviewed and are negative.  Physical Exam: BP 108/72 (BP Location: Left Arm, Patient Position: Sitting, Cuff Size: Normal)   Pulse 62   Ht 5\' 7"  (1.702 m)   Wt 163 lb (73.9 kg)   BMI 25.53 kg/m  GENERAL:  Well appearing WM in NAD HEENT:  PERRL, EOMI, sclera are clear. Oropharynx is clear. NECK:  No jugular venous distention, carotid upstroke brisk and symmetric, no bruits, no thyromegaly or adenopathy LUNGS:  Clear to auscultation bilaterally CHEST:  Unremarkable HEART:  RRR,  PMI not displaced or sustained,S1 and S2 within normal limits, no S3, no S4: no clicks, no rubs, no murmurs ABD:  Soft, nontender. BS +, no masses or bruits. No hepatomegaly, no splenomegaly EXT:  2 + pulses throughout, no edema, no cyanosis no clubbing SKIN:  Warm and dry.  No rashes NEURO:  Alert and oriented x 3. Cranial nerves II through XII intact. PSYCH:  Cognitively intact   LABORATORY DATA: Lab Results  Component Value Date   WBC 8.2 04/13/2020   HGB 15.5 04/13/2020   HCT 45.9 04/13/2020   PLT 259 04/13/2020   GLUCOSE 109 (H) 04/13/2020   CHOL 136 04/13/2020   TRIG 173 (H) 04/13/2020   HDL 32 (L) 04/13/2020   LDLCALC 74 04/13/2020   ALT 45 (H) 04/13/2020   AST 24 04/13/2020   NA 142 04/13/2020   K 4.8 04/13/2020   CL 105 04/13/2020   CREATININE 0.82 04/13/2020   BUN 19 04/13/2020   CO2 23 04/13/2020   TSH 1.458 12/11/2014   INR 1.04 12/10/2014   HGBA1C (H) 04/23/2011    6.0 (NOTE)                                                                       According to the ADA Clinical Practice Recommendations for 2011, when HbA1c is used as a screening test:   >=6.5%   Diagnostic of Diabetes Mellitus           (if abnormal result  is confirmed)  5.7-6.4%   Increased risk of  developing Diabetes Mellitus  References:Diagnosis and Classification of Diabetes Mellitus,Diabetes Care,2011,34(Suppl 1):S62-S69 and Standards of Medical Care in         Diabetes - 2011,Diabetes Care,2011,34  (Suppl 1):S11-S61.   Ecg today shows NSR with ratee 62. Normal Ecg. I have personally reviewed and interpreted this study.   Assessment /  Plan: 1. CAD - remote BMS of the OM in 2012. S/p DES of LAD 04/13/14. He remains asymptomatic. Continue metoprolol. Not on antiplatelet therapy due to being on Eliquis.  2. Atrial fibrillation. Paroxysmal. S/p successful ablation in February 2017. Maintaining NSR. Continue eliquis and metoprolol.   3. HLD - on high dose statin therapy. Last LDL 74 and triglycerides 173. He has lost almost 4 lbs. Will repeat lab work next visit.   4. Tobacco abuse - he has quit years ago  5. HTN-  Well controlled on medication.   Follow up 6  Months with labs.

## 2020-10-26 ENCOUNTER — Encounter: Payer: Self-pay | Admitting: Cardiology

## 2020-10-26 ENCOUNTER — Other Ambulatory Visit: Payer: Self-pay

## 2020-10-26 ENCOUNTER — Ambulatory Visit (INDEPENDENT_AMBULATORY_CARE_PROVIDER_SITE_OTHER): Payer: Federal, State, Local not specified - PPO | Admitting: Cardiology

## 2020-10-26 VITALS — BP 108/72 | HR 62 | Ht 67.0 in | Wt 163.0 lb

## 2020-10-26 DIAGNOSIS — I10 Essential (primary) hypertension: Secondary | ICD-10-CM | POA: Diagnosis not present

## 2020-10-26 DIAGNOSIS — I480 Paroxysmal atrial fibrillation: Secondary | ICD-10-CM | POA: Diagnosis not present

## 2020-10-26 DIAGNOSIS — I2510 Atherosclerotic heart disease of native coronary artery without angina pectoris: Secondary | ICD-10-CM | POA: Diagnosis not present

## 2020-10-26 DIAGNOSIS — E782 Mixed hyperlipidemia: Secondary | ICD-10-CM | POA: Diagnosis not present

## 2020-12-02 ENCOUNTER — Other Ambulatory Visit: Payer: Self-pay | Admitting: Cardiology

## 2020-12-02 DIAGNOSIS — I2510 Atherosclerotic heart disease of native coronary artery without angina pectoris: Secondary | ICD-10-CM

## 2021-04-16 DIAGNOSIS — E782 Mixed hyperlipidemia: Secondary | ICD-10-CM | POA: Diagnosis not present

## 2021-04-16 DIAGNOSIS — I2510 Atherosclerotic heart disease of native coronary artery without angina pectoris: Secondary | ICD-10-CM | POA: Diagnosis not present

## 2021-04-16 DIAGNOSIS — I480 Paroxysmal atrial fibrillation: Secondary | ICD-10-CM | POA: Diagnosis not present

## 2021-04-16 DIAGNOSIS — I10 Essential (primary) hypertension: Secondary | ICD-10-CM | POA: Diagnosis not present

## 2021-04-17 ENCOUNTER — Other Ambulatory Visit: Payer: Self-pay

## 2021-04-17 DIAGNOSIS — I2510 Atherosclerotic heart disease of native coronary artery without angina pectoris: Secondary | ICD-10-CM

## 2021-04-17 DIAGNOSIS — E782 Mixed hyperlipidemia: Secondary | ICD-10-CM

## 2021-04-17 LAB — CBC WITH DIFFERENTIAL/PLATELET
Basophils Absolute: 0 10*3/uL (ref 0.0–0.2)
Basos: 0 %
EOS (ABSOLUTE): 0.1 10*3/uL (ref 0.0–0.4)
Eos: 1 %
Hematocrit: 46.5 % (ref 37.5–51.0)
Hemoglobin: 15.4 g/dL (ref 13.0–17.7)
Immature Grans (Abs): 0 10*3/uL (ref 0.0–0.1)
Immature Granulocytes: 0 %
Lymphocytes Absolute: 2.3 10*3/uL (ref 0.7–3.1)
Lymphs: 29 %
MCH: 29.4 pg (ref 26.6–33.0)
MCHC: 33.1 g/dL (ref 31.5–35.7)
MCV: 89 fL (ref 79–97)
Monocytes Absolute: 0.6 10*3/uL (ref 0.1–0.9)
Monocytes: 8 %
Neutrophils Absolute: 5 10*3/uL (ref 1.4–7.0)
Neutrophils: 62 %
Platelets: 239 10*3/uL (ref 150–450)
RBC: 5.23 x10E6/uL (ref 4.14–5.80)
RDW: 13.5 % (ref 11.6–15.4)
WBC: 8 10*3/uL (ref 3.4–10.8)

## 2021-04-17 LAB — HEPATIC FUNCTION PANEL
ALT: 37 IU/L (ref 0–44)
AST: 25 IU/L (ref 0–40)
Albumin: 4.6 g/dL (ref 3.8–4.9)
Alkaline Phosphatase: 70 IU/L (ref 44–121)
Bilirubin Total: 0.8 mg/dL (ref 0.0–1.2)
Bilirubin, Direct: 0.2 mg/dL (ref 0.00–0.40)
Total Protein: 6.9 g/dL (ref 6.0–8.5)

## 2021-04-17 LAB — BASIC METABOLIC PANEL
BUN/Creatinine Ratio: 14 (ref 10–24)
BUN: 13 mg/dL (ref 8–27)
CO2: 21 mmol/L (ref 20–29)
Calcium: 9.3 mg/dL (ref 8.6–10.2)
Chloride: 105 mmol/L (ref 96–106)
Creatinine, Ser: 0.95 mg/dL (ref 0.76–1.27)
Glucose: 82 mg/dL (ref 65–99)
Potassium: 4.4 mmol/L (ref 3.5–5.2)
Sodium: 141 mmol/L (ref 134–144)
eGFR: 92 mL/min/{1.73_m2} (ref >59)

## 2021-04-17 LAB — LIPID PANEL
Chol/HDL Ratio: 4.5 ratio (ref 0.0–5.0)
Cholesterol, Total: 156 mg/dL (ref 100–199)
HDL: 35 mg/dL — ABNORMAL LOW (ref >39)
LDL Chol Calc (NIH): 101 mg/dL — ABNORMAL HIGH (ref 0–99)
Triglycerides: 109 mg/dL (ref 0–149)
VLDL Cholesterol Cal: 20 mg/dL (ref 5–40)

## 2021-04-17 LAB — HEMOGLOBIN A1C
Est. average glucose Bld gHb Est-mCnc: 128 mg/dL
Hgb A1c MFr Bld: 6.1 % — ABNORMAL HIGH (ref 4.8–5.6)

## 2021-04-17 MED ORDER — ATORVASTATIN CALCIUM 80 MG PO TABS: 80.0000 mg | ORAL_TABLET | Freq: Every day | ORAL | 3 refills | Status: DC

## 2021-04-20 NOTE — Progress Notes (Deleted)
Mark [name] Date of Birth: [birth year] Medical Record #[id number]  History of Present Illness: Mark [name] is seen for follow up Atrial fibrillation and CAD. He has HLD, CAD, HTN, SVT with remote ablation in 2008 per Dr. Ladona Ridgel and prior history of tobacco abuse. Had STEMI in May of 2012 and was treated with BMS to the 3rd OM.  Follow up Myoview in June of 2012 was satisfactory. In April 2015 Myoview was intermediate risk. He underwent cardiac cath which showed severe stenosis in the proximal LAD. He had successful DES of the LAD. He was intolerant of Brilinta due to dyspnea and was switched to Effient.   In December 2015 he was admitted with new atrial fibrillation with RVR. Labs were normal. He converted spontaneously. Echo was normal. He was placed on Eliquis.  Plavix and ASA were stopped. He was started on diltiazem. He later developed increased symptoms of palpitations with recurrent Afib. In February 2017 he underwent AFib ablation with Dr. Elberta Fortis with excellent results.    On follow up today he reports he is doing very well. No palpitations.   Denies any chest pain, SOB, or dizziness.  He is not smoking. He states he is very active at work and does a lot of walking there. No bleeding.  Current Outpatient Medications  Medication Sig Dispense Refill  . apixaban (ELIQUIS) 5 MG TABS tablet Take 1 tablet (5 mg total) by mouth 2 (two) times daily. 180 tablet 3  . atorvastatin (LIPITOR) 80 MG tablet Take 1 tablet (80 mg total) by mouth daily. 90 tablet 3  . metoprolol tartrate (LOPRESSOR) 50 MG tablet TAKE 1 TABLET BY MOUTH TWICE A DAY 180 tablet 3  . nitroGLYCERIN (NITROSTAT) 0.4 MG SL tablet PLACE 1 TABLET UNDER TONGUE EVERY 5 MINS AS NEEDED FOR CHEST PAIN, ON 3RD DOSE CALL 911 25 tablet 1  . Omega-3 Fatty Acids (FISH OIL) 1000 MG CAPS Take 2 capsules 2 gms twice a day 100 capsule 6  . omeprazole (PRILOSEC) 40 MG capsule Take 40 mg by mouth daily.   6   No current facility-administered  medications for this visit.    Allergies  Allergen Reactions  . Penicillins     Passed out and got weak  Has patient had a PCN reaction causing immediate rash, facial/tongue/throat swelling, SOB or lightheadedness with hypotension: Yes Has patient had a PCN reaction causing severe rash involving mucus membranes or skin necrosis: No Has patient had a PCN reaction that required hospitalization No Has patient had a PCN reaction occurring within the last 10 years: No If all of the above answers are "NO", then may proceed with Cephalosporin use.    Past Medical History:  Diagnosis Date  . Atrial fibrillation (HCC)   . CAD (coronary artery disease)   . Hyperlipidemia   . Hypertension   . S/P coronary artery stent placement 04/23/11   BMS to the 3rd OM; has residual disease  . STEMI (ST elevation myocardial infarction) (HCC) 04/23/11  . SVT (supraventricular tachycardia) (HCC)   . Tobacco abuse     Past Surgical History:  Procedure Laterality Date  . CORONARY STENT PLACEMENT  04/23/11   BMS to the 3rd OM  . ELECTROPHYSIOLOGIC STUDY N/A 01/25/2016   Procedure: Atrial Fibrillation Ablation;  Surgeon: Will [name], MD;  Location: [facility];  Service: Cardiovascular;  Laterality: N/A;  . LEFT HEART CATHETERIZATION WITH CORONARY ANGIOGRAM N/A 04/13/2014   Procedure: LEFT HEART CATHETERIZATION WITH CORONARY ANGIOGRAM;  Surgeon: Demetria Pore  Swaziland, MD;  Location[facility] CATH LAB;  Service: Cardiovascular;  Laterality: N/A;  . PERCUTANEOUS CORONARY STENT INTERVENTION (PCI-S) Right 04/13/2014   Procedure: PERCUTANEOUS CORONARY STENT INTERVENTION (PCI-S);  Surgeon: Duquan Gillooly M Swaziland, MD;  Location: [facility] CATH LAB;  Service: Cardiovascular;  Laterality: Right;  . SVT ABLATION   2008   Dr. Ladona Ridgel    Social History   Tobacco Use  Smoking Status Former Smoker  . Quit date: 04/23/2011  . Years since quitting: 10.0  Smokeless Tobacco Former Neurosurgeon  . Quit date: 04/23/2011    Social History    Substance and Sexual Activity  Alcohol Use No    Family History  Problem Relation Age of Onset  . Hypertension Mother     Review of Systems: The review of systems is per the HPI.  All other systems were reviewed and are negative.  Physical Exam: There were no vitals taken for this visit. GENERAL:  Well appearing WM in NAD HEENT:  PERRL, EOMI, sclera are clear. Oropharynx is clear. NECK:  No jugular venous distention, carotid upstroke brisk and symmetric, no bruits, no thyromegaly or adenopathy LUNGS:  Clear to auscultation bilaterally CHEST:  Unremarkable HEART:  RRR,  PMI not displaced or sustained,S1 and S2 within normal limits, no S3, no S4: no clicks, no rubs, no murmurs ABD:  Soft, nontender. BS +, no masses or bruits. No hepatomegaly, no splenomegaly EXT:  2 + pulses throughout, no edema, no cyanosis no clubbing SKIN:  Warm and dry.  No rashes NEURO:  Alert and oriented x 3. Cranial nerves II through XII intact. PSYCH:  Cognitively intact   LABORATORY DATA: Lab Results  Component Value Date   WBC 8.0 04/16/2021   HGB 15.4 04/16/2021   HCT 46.5 04/16/2021   PLT 239 04/16/2021   GLUCOSE 82 04/16/2021   CHOL 156 04/16/2021   TRIG 109 04/16/2021   HDL 35 (L) 04/16/2021   LDLCALC 101 (H) 04/16/2021   ALT 37 04/16/2021   AST 25 04/16/2021   NA 141 04/16/2021   K 4.4 04/16/2021   CL 105 04/16/2021   CREATININE 0.95 04/16/2021   BUN 13 04/16/2021   CO2 21 04/16/2021   TSH 1.458 12/11/2014   INR 1.04 12/10/2014   HGBA1C 6.1 (H) 04/16/2021   Ecg today shows NSR with ratee 62. Normal Ecg. I have personally reviewed and interpreted this study.   Assessment / Plan: 1. CAD - remote BMS of the OM in 2012. S/p DES of LAD 04/13/14. He remains asymptomatic. Continue metoprolol. Not on antiplatelet therapy due to being on Eliquis.  2. Atrial fibrillation. Paroxysmal. S/p successful ablation in February 2017. Maintaining NSR. Continue eliquis and metoprolol.   3. HLD -  on high dose statin therapy. Last LDL 74 and triglycerides 173. He has lost almost 4 lbs. Will repeat lab work next visit.   4. Tobacco abuse - he has quit years ago  5. HTN-  Well controlled on medication.   Follow up 6  Months with labs.

## 2021-04-23 ENCOUNTER — Other Ambulatory Visit: Payer: Self-pay | Admitting: Cardiology

## 2021-04-23 NOTE — Telephone Encounter (Signed)
66m, 73.9kg, scr 0.95 04/16/21, lovw/jordan 10/26/20

## 2021-04-26 ENCOUNTER — Ambulatory Visit: Payer: Federal, State, Local not specified - PPO | Admitting: Cardiology

## 2021-05-13 NOTE — Progress Notes (Signed)
Cardiology Office Note:    Date:  05/17/2021   ID:  [name], DOB [birth year], MRN [id number]  PCP:  Patient, No Pcp Per (Inactive)  Cardiologist:  Peter Swaziland, MD  Electrophysiologist:  Will [name], MD   Referring MD: No ref. provider found   Chief Complaint: follow-up of CAD and atrial fibrillation  History of Present Illness:    Mark [name] is a 60 y.o. male with a history of CAD with STEMI in 04/2011 s/p BMS to OM3 and then DES to proximal LAD in 03/2014, paroxysmal atrial fibrillation s/p ablation in 01/2016 on Eliquis, SVT s/p remote ablation in 2007, hypertension, hyperlipidemia, prediabetes, and prior tobacco abuse who is followed by Dr. Swaziland and presents today for routine follow-up.   Patient has history of CAD with STEMI in 04/2011 which was treated with BMS to OM3. Myoview in 03/2014 was intermediate risk and was concerning for ischemia. He underwent cardiac catheterization later that month which showed severe stenosis of proximal LAD. This was treated with DES. Prior stent to OM3 was patent. He was admitted in 11/2014 with new onset atrial fibrillation with RVR. Echo showed LVEF of 55-60% with normal wall motion and normal diastolic function. He converted back to sinus rhythm spontaneously. DAPT was stopped at that time and he was started on Eliquis. He later developed increased symptoms of palpitations. Monitor in 10/2015 showed paroxsymal atrial fibrillation with RVR. He was referred to EP and underwent atrial fibrillation ablation in 01/2016 with Dr. Elberta Fortis.  Patient was last seen by Dr. Swaziland in 10/2020 at which time he was doing very well from a cardiac standpoint.   Patient presents today for follow-up. Patient is doing well since last visit. No cardiac complaints. No chest pain, shortness of breath, orthopnea, PND, lower extremity edema, palpitations, lightheadedness/dizziness, near syncope/syncope. He will bleed easier on the Eliquis if he cuts himself but no  abnormal bleeding in urine or stools (no hematochezia, melena, or hematuria).   Past Medical History:  Diagnosis Date  . Atrial fibrillation (HCC)   . CAD (coronary artery disease)   . Hyperlipidemia   . Hypertension   . S/P coronary artery stent placement 04/23/11   BMS to the 3rd OM; has residual disease  . STEMI (ST elevation myocardial infarction) (HCC) 04/23/11  . SVT (supraventricular tachycardia) (HCC)   . Tobacco abuse     Past Surgical History:  Procedure Laterality Date  . CORONARY STENT PLACEMENT  04/23/11   BMS to the 3rd OM  . ELECTROPHYSIOLOGIC STUDY N/A 01/25/2016   Procedure: Atrial Fibrillation Ablation;  Surgeon: Will [name], MD;  Location: [facility] CV LAB;  Service: Cardiovascular;  Laterality: N/A;  . LEFT HEART CATHETERIZATION WITH CORONARY ANGIOGRAM N/A 04/13/2014   Procedure: LEFT HEART CATHETERIZATION WITH CORONARY ANGIOGRAM;  Surgeon: Peter M Swaziland, MD;  Location: [facility] CATH LAB;  Service: Cardiovascular;  Laterality: N/A;  . PERCUTANEOUS CORONARY STENT INTERVENTION (PCI-S) Right 04/13/2014   Procedure: PERCUTANEOUS CORONARY STENT INTERVENTION (PCI-S);  Surgeon: Peter M Swaziland, MD;  Location: [facility] CATH LAB;  Service: Cardiovascular;  Laterality: Right;  . SVT ABLATION   2008   Dr. Ladona Ridgel    Current Medications: Current Meds  Medication Sig  . atorvastatin (LIPITOR) 80 MG tablet Take 1 tablet (80 mg total) by mouth daily.  Marland Kitchen ELIQUIS 5 MG TABS tablet TAKE 1 TABLET BY MOUTH TWICE A DAY  . metoprolol tartrate (LOPRESSOR) 50 MG tablet TAKE 1 TABLET BY MOUTH TWICE A DAY  . nitroGLYCERIN (NITROSTAT)  0.4 MG SL tablet PLACE 1 TABLET UNDER TONGUE EVERY 5 MINS AS NEEDED FOR CHEST PAIN, ON 3RD DOSE CALL 911  . Omega-3 Fatty Acids (FISH OIL) 1000 MG CAPS Take 2 capsules 2 gms twice a day  . omeprazole (PRILOSEC) 40 MG capsule Take 40 mg by mouth daily.      Allergies:   Penicillins   Social History   Socioeconomic History  . Marital status: Married    Spouse  name: Not on file  . Number of children: 2  . Years of education: Not on file  . Highest education level: Not on file  Occupational History  . Occupation: food Corporate treasurer: [employer]  Tobacco Use  . Smoking status: Former Smoker    Quit date: 04/23/2011    Years since quitting: 10.0  . Smokeless tobacco: Former Neurosurgeon    Quit date: 04/23/2011  Substance and Sexual Activity  . Alcohol use: No  . Drug use: No  . Sexual activity: Yes  Other Topics Concern  . Not on file  Social History Narrative  . Not on file   Social Determinants of Health   Financial Resource Strain: Not on file  Food Insecurity: Not on file  Transportation Needs: Not on file  Physical Activity: Not on file  Stress: Not on file  Social Connections: Not on file     Family History: The patient's family history includes Hypertension in his mother.  ROS:   Please see the history of present illness.     EKGs/Labs/Other Studies Reviewed:    The following studies were reviewed today:  Left Cardiac Catheterization 04/13/2014: Final Conclusions:   1. Single vessel obstructive CAD with progressive disease in the proximal LAD. Continued patency of the stent in the OM. 2. Normal LV function. 3. Successful stenting of the proximal LAD with a DES.   Recommendations:  DAPT for one year. Will monitor overnight in the step down unit. If no complications may be able to DC in am. _______________  Echocardiogram 12/29/2014: Study Conclusions: - Left ventricle: The cavity size was normal. Systolic function was  normal. The estimated ejection fraction was in the range of 55%  to 60%. Wall motion was normal; there were no regional wall  motion abnormalities. Left ventricular diastolic function  parameters were normal.  _______________  Event Monitor 10/2015: Paroxysmal atrial fibrillation with RVR. symptoms correlate with arrhythmia. _______________  Atrial Fibrillation Ablation  01/25/2016: Conclusions: 1. Sinus rhythm upon presentation.   2. Successful electrical isolation and anatomical encircling of all four pulmonary veins with radiofrequency current. 3. No inducible arrhythmias following ablation. 4. No early apparent complications.  EKG:  EKG not ordered today.   Recent Labs: 04/16/2021: ALT 37; BUN 13; Creatinine, Ser 0.95; Hemoglobin 15.4; Platelets 239; Potassium 4.4; Sodium 141  Recent Lipid Panel    Component Value Date/Time   CHOL 156 04/16/2021 1555   TRIG 109 04/16/2021 1555   HDL 35 (L) 04/16/2021 1555   CHOLHDL 4.5 04/16/2021 1555   CHOLHDL 5.8 (H) 12/13/2016 0856   VLDL 39 (H) 12/13/2016 0856   LDLCALC 101 (H) 04/16/2021 1555    Physical Exam:    Vital Signs: BP 112/80   Pulse 72   Ht 5\' 7"  (1.702 m)   Wt 154 lb 9.6 oz (70.1 kg)   SpO2 97%   BMI 24.21 kg/m     Wt Readings from Last 3 Encounters:  05/17/21 154 lb 9.6 oz (70.1 kg)  10/26/20 163 lb (  73.9 kg)  04/13/20 166 lb 12.8 oz (75.7 kg)     General: 60 y.o. Caucasian male in no acute distress. HEENT: Normocephalic and atraumatic. Sclera clear. Neck: Supple. No carotid bruits. No JVD. Heart: RRR. Distinct S1 and S2. No murmurs, gallops, or rubs. Radial pulses 2+ and equal bilaterally. Lungs: No increased work of breathing. Clear to ausculation bilaterally. No wheezes, rhonchi, or rales.  Abdomen: Soft, non-distended, and non-tender to palpation.  Extremities: No lower extremity edema.    Skin: Warm and dry. Neuro: Alert and oriented x3. No focal deficits. Psych: Normal affect. Responds appropriately.  Assessment:    1. Coronary artery disease involving native coronary artery of native heart without angina pectoris   2. Paroxysmal atrial fibrillation s/p ablation   3. Paroxysmal SVT s/p ablation   4. Primary hypertension   5. Hyperlipidemia, unspecified hyperlipidemia type     Plan:    CAD - History of STEMI in 04/2011 s/p BMS to OM3 and then DES to proximal LAD in  03/2014.  - No chest pain.  - No aspirin due to need for DOAC. - Continue beta-blocker and high-intensity statin.  Paroxysmal Atrial Fibrillation s/p Ablation - S/p successful ablation in 01/2016. - EKG was not performed today but regular rhythm on exam.  - No palpitations.  - Continue Lopressor 50mg  twice daily. - Continue Eliquis 5mg  twice daily.  Paroxysmal SVT s/p Remote Ablation - S/p remote ablation in 2008. - No documented recurrence.  - Continue Lopressor as above.  Hypertension - BP well controlled. - Continue Lopressor as above.  Hyperlipidemia - Lipid panel on 04/16/2021: Total Cholesterol 156, Triglycerides 109, HDL 35, LDL 101. - LDL goal <70 given CAD. - Lipitor was increased to 80mg  daily at time of last check. Plan is for repeat lipid panel and LFTs in 3 months. Orders have already been placed. - Also discussed importance of physical activity and heart healthy diet.  Prediabetes - Hemoglobin A1c 6.1 on 04/16/2021 consistent with prediabetes. - Recommend lifestyle modifications with exercise and diet.  Disposition: Follow up in 6 months with Dr. SwazilandJordan.   Medication Adjustments/Labs and Tests Ordered: Current medicines are reviewed at length with the patient today.  Concerns regarding medicines are outlined above.  No orders of the defined types were placed in this encounter.  No orders of the defined types were placed in this encounter.   Patient Instructions  Medication Instructions:  Continue current medications  *If you need a refill on your cardiac medications before your next appointment, please call your pharmacy*   Lab Work: None Ordered   Testing/Procedures: None Ordered   Follow-Up: At [facility], you and your health needs are our priority.  As part of our continuing mission to provide you with exceptional heart care, we have created designated Provider Care Teams.  These Care Teams include your primary Cardiologist (physician) and  Advanced Practice Providers (APPs -  Physician Assistants and Nurse Practitioners) who all work together to provide you with the care you need, when you need it.  We recommend signing up for the patient portal called "MyChart".  Sign up information is provided on this After Visit Summary.  MyChart is used to connect with patients for Virtual Visits (Telemedicine).  Patients are able to view lab/test results, encounter notes, upcoming appointments, etc.  Non-urgent messages can be sent to your provider as well.   To learn more about what you can do with MyChart, go to [email or web address].    Your next appointment:  6 month(s)  The format for your next appointment:   In Person  Provider:   You may see Peter Swaziland, MD or one of the following Advanced Practice Providers on your designated Care Team:    Azalee Course, PA-C  Micah Flesher, PA-C or   Judy Pimple, PA-C       Signed, Corrin Parker, [place]  05/17/2021 9:26 AM    [facility]

## 2021-05-17 ENCOUNTER — Encounter: Payer: Self-pay | Admitting: Student

## 2021-05-17 ENCOUNTER — Ambulatory Visit (INDEPENDENT_AMBULATORY_CARE_PROVIDER_SITE_OTHER): Payer: Federal, State, Local not specified - PPO | Admitting: Student

## 2021-05-17 ENCOUNTER — Other Ambulatory Visit: Payer: Self-pay

## 2021-05-17 VITALS — BP 112/80 | HR 72 | Ht 67.0 in | Wt 154.6 lb

## 2021-05-17 DIAGNOSIS — I10 Essential (primary) hypertension: Secondary | ICD-10-CM

## 2021-05-17 DIAGNOSIS — I471 Supraventricular tachycardia, unspecified: Secondary | ICD-10-CM

## 2021-05-17 DIAGNOSIS — I2510 Atherosclerotic heart disease of native coronary artery without angina pectoris: Secondary | ICD-10-CM

## 2021-05-17 DIAGNOSIS — I480 Paroxysmal atrial fibrillation: Secondary | ICD-10-CM | POA: Diagnosis not present

## 2021-05-17 DIAGNOSIS — E785 Hyperlipidemia, unspecified: Secondary | ICD-10-CM

## 2021-05-17 NOTE — Patient Instructions (Signed)
Medication Instructions:  Continue current medications  *If you need a refill on your cardiac medications before your next appointment, please call your pharmacy*   Lab Work: None Ordered   Testing/Procedures: None Ordered   Follow-Up: At [facility], you and your health needs are our priority.  As part of our continuing mission to provide you with exceptional heart care, we have created designated Provider Care Teams.  These Care Teams include your primary Cardiologist (physician) and Advanced Practice Providers (APPs -  Physician Assistants and Nurse Practitioners) who all work together to provide you with the care you need, when you need it.  We recommend signing up for the patient portal called "MyChart".  Sign up information is provided on this After Visit Summary.  MyChart is used to connect with patients for Virtual Visits (Telemedicine).  Patients are able to view lab/test results, encounter notes, upcoming appointments, etc.  Non-urgent messages can be sent to your provider as well.   To learn more about what you can do with MyChart, go to [email or web address].    Your next appointment:   6 month(s)  The format for your next appointment:   In Person  Provider:   You may see Peter Jordan, MD or one of the following Advanced Practice Providers on your designated Care Team:    Hao Meng, PA-C  Angela Duke, PA-C or   Krista Kroeger, PA-C      

## 2021-06-06 ENCOUNTER — Other Ambulatory Visit: Payer: Self-pay | Admitting: Cardiology

## 2021-07-19 DIAGNOSIS — E782 Mixed hyperlipidemia: Secondary | ICD-10-CM | POA: Diagnosis not present

## 2021-07-19 DIAGNOSIS — I2510 Atherosclerotic heart disease of native coronary artery without angina pectoris: Secondary | ICD-10-CM | POA: Diagnosis not present

## 2021-07-19 LAB — LIPID PANEL
Chol/HDL Ratio: 3.8 ratio (ref 0.0–5.0)
Cholesterol, Total: 129 mg/dL (ref 100–199)
HDL: 34 mg/dL — ABNORMAL LOW (ref >39)
LDL Chol Calc (NIH): 78 mg/dL (ref 0–99)
Triglycerides: 88 mg/dL (ref 0–149)
VLDL Cholesterol Cal: 17 mg/dL (ref 5–40)

## 2021-07-19 LAB — HEPATIC FUNCTION PANEL
ALT: 43 IU/L (ref 0–44)
AST: 25 IU/L (ref 0–40)
Albumin: 4.7 g/dL (ref 3.8–4.9)
Alkaline Phosphatase: 68 IU/L (ref 44–121)
Bilirubin Total: 0.6 mg/dL (ref 0.0–1.2)
Bilirubin, Direct: 0.17 mg/dL (ref 0.00–0.40)
Total Protein: 7.1 g/dL (ref 6.0–8.5)

## 2021-11-12 NOTE — Progress Notes (Signed)
error 

## 2021-11-14 NOTE — Progress Notes (Signed)
Cardiology Clinic Note   Patient Name: Mark [name] Date of Encounter: 11/15/2021  Primary Care Provider:  Patient, No Pcp Per (Inactive) Primary Cardiologist:  Peter Swaziland, MD  Patient Profile    60 year old male with a history of CAD s/p STEMI in 04/2011, s/p BMS to OM 3 and DES to proximal LAD in 03/2014, paroxysmal atrial fibrillation s/p ablation in 01/2016 on Eliquis, SVT s/p remote ablation in 2007, hypertension, hyperlipidemia, prediabetes, prior tobacco abuse who presents for follow-up related to CAD.  Past Medical History:  Diagnosis Date   Atrial fibrillation (HCC)    CAD (coronary artery disease)    Hyperlipidemia    Hypertension    S/P coronary artery stent placement 04/23/11   BMS to the 3rd OM; has residual disease   STEMI (ST elevation myocardial infarction) (HCC) 04/23/11   SVT (supraventricular tachycardia) (HCC)    Tobacco abuse    Past Surgical History:  Procedure Laterality Date   CORONARY STENT PLACEMENT  04/23/11   BMS to the 3rd OM   ELECTROPHYSIOLOGIC STUDY N/A 01/25/2016   Procedure: Atrial Fibrillation Ablation;  Surgeon: Will [name], MD;  Location: [facility] INVASIVE CV LAB;  Service: Cardiovascular;  Laterality: N/A;   LEFT HEART CATHETERIZATION WITH CORONARY ANGIOGRAM N/A 04/13/2014   Procedure: LEFT HEART CATHETERIZATION WITH CORONARY ANGIOGRAM;  Surgeon: Peter M Swaziland, MD;  Location: [facility] CATH LAB;  Service: Cardiovascular;  Laterality: N/A;   PERCUTANEOUS CORONARY STENT INTERVENTION (PCI-S) Right 04/13/2014   Procedure: PERCUTANEOUS CORONARY STENT INTERVENTION (PCI-S);  Surgeon: Peter M Swaziland, MD;  Location: [facility] CATH LAB;  Service: Cardiovascular;  Laterality: Right;   SVT ABLATION   2008   Dr. Ladona Ridgel    Allergies  Allergies  Allergen Reactions   Penicillins     Passed out and got weak  Has patient had a PCN reaction causing immediate rash, facial/tongue/throat swelling, SOB or lightheadedness with hypotension: Yes Has patient had a PCN reaction  causing severe rash involving mucus membranes or skin necrosis: No Has patient had a PCN reaction that required hospitalization No Has patient had a PCN reaction occurring within the last 10 years: No If all of the above answers are "NO", then may proceed with Cephalosporin use.    History of Present Illness    60 year old male with the above past medical history including CAD s/p STEMI/BMS to OM 3 in 04/2011 and DES to proximal LAD in 03/2014, paroxysmal atrial fibrillation s/p ablation in 01/2016 on Eliquis, SVT s/p remote ablation in 2007, hypertension, hyperlipidemia, prediabetes, and prior tobacco abuse.  Cardiac history dates back to May 2012 in the setting of STEMI which was treated with a bare-metal stent to the OM 3.  Myoview in 03/2014 was intermediate risk, concerning for ischemia.  Subsequent cardiac catheterization in 03/2014 showed severe stenosis of the proximal LAD, this was treated with a drug-eluting stent, and a patent stent to OM 3. He was admitted to the hospital in December 2015 with new onset atrial fibrillation with RVR.  Echo showed an EF of 55 to 60% with normal wall motion and normal diastolic function.  He spontaneously converted to sinus rhythm. He was transitioned from DAPT to Eliquis. He later developed symptoms of palpitations.  Monitor in 10/2015 showed paroxysmal atrial fibrillation with RVR.  He was referred to EP and underwent atrial fibrillation ablation in February 2017. He was last seen in our office in June 2022 for routine follow-up and was doing well, for cardiac standpoint.  LDL was above goal  at the time, and his Lipitor was increased to 80 mg daily.  Heart healthy diet and regular exercise were encouraged.  Since his last visit he is doing well from a cardiac standpoint.  He is working and does yard work but does not participate in regular exercise.  He has no complaints today.  He denies chest pain, palpitations, dyspnea, pnd, orthopnea, n, v, dizziness, syncope,  edema, weight gain, or early satiety.   Home Medications    Current Outpatient Medications  Medication Sig Dispense Refill   atorvastatin (LIPITOR) 80 MG tablet Take 1 tablet (80 mg total) by mouth daily. 90 tablet 3   ELIQUIS 5 MG TABS tablet TAKE 1 TABLET BY MOUTH TWICE A DAY 180 tablet 1   metoprolol tartrate (LOPRESSOR) 50 MG tablet TAKE 1 TABLET BY MOUTH TWICE A DAY 180 tablet 3   nitroGLYCERIN (NITROSTAT) 0.4 MG SL tablet PLACE 1 TABLET UNDER TONGUE EVERY 5 MINS AS NEEDED FOR CHEST PAIN, ON 3RD DOSE CALL 911 25 tablet 1   Omega-3 Fatty Acids (FISH OIL) 1000 MG CAPS Take 2 capsules 2 gms twice a day 100 capsule 6   omeprazole (PRILOSEC) 40 MG capsule Take 40 mg by mouth daily.   6   No current facility-administered medications for this visit.     Family History    Family History  Problem Relation Age of Onset   Hypertension Mother    He indicated that his mother is alive. He indicated that his father is deceased. He indicated that his sister is alive. He indicated that his brother is alive.  Social History    Social History   Socioeconomic History   Marital status: Married    Spouse name: Not on file   Number of children: 2   Years of education: Not on file   Highest education level: Not on file  Occupational History   Occupation: food Public house manager: [employer]  Tobacco Use   Smoking status: Former    Types: Cigarettes    Quit date: 04/23/2011    Years since quitting: 10.5   Smokeless tobacco: Former    Quit date: 04/23/2011  Substance and Sexual Activity   Alcohol use: No   Drug use: No   Sexual activity: Yes  Other Topics Concern   Not on file  Social History Narrative   Not on file   Social Determinants of Health   Financial Resource Strain: Not on file  Food Insecurity: Not on file  Transportation Needs: Not on file  Physical Activity: Not on file  Stress: Not on file  Social Connections: Not on file  Intimate Partner Violence: Not on file      Review of Systems    General:  No chills, fever, night sweats or weight changes.  Cardiovascular:  No chest pain, dyspnea on exertion, edema, orthopnea, palpitations, paroxysmal nocturnal dyspnea. Dermatological: No rash, lesions/masses Respiratory: No cough, dyspnea Urologic: No hematuria, dysuria Abdominal: No nausea, vomiting, diarrhea, bright red blood per rectum, melena, or hematemesis Neurologic: No visual changes, wkns, changes in mental status. All other systems reviewed and are otherwise negative except as noted above.    Physical Exam    VS:  BP 126/78   Pulse 64   Ht 5\' 7"  (1.702 m)   Wt 161 lb 6.4 oz (73.2 kg)   SpO2 95%   BMI 25.28 kg/m   GEN: Well nourished, well developed, in no acute distress. HEENT: normal. Neck: Supple, no JVD, carotid  bruits, or masses. Cardiac: RRR, no murmurs, rubs, or gallops. No clubbing, cyanosis, edema.  Radials/DP/PT 2+ and equal bilaterally.  Respiratory:  Respirations regular and unlabored, clear to auscultation bilaterally. GI: Soft, nontender, nondistended, BS + x 4. MS: no deformity or atrophy. Skin: warm and dry, no rash. Neuro:  Strength and sensation are intact. Psych: Normal affect.  Accessory Clinical Findings    ECG personally reviewed by me today- Normal sinus rhythm, 60 bpm, non-specific ST changes - No acute changes  Lab Results  Component Value Date   WBC 8.0 04/16/2021   HGB 15.4 04/16/2021   HCT 46.5 04/16/2021   MCV 89 04/16/2021   PLT 239 04/16/2021   Lab Results  Component Value Date   CREATININE 0.95 04/16/2021   BUN 13 04/16/2021   NA 141 04/16/2021   K 4.4 04/16/2021   CL 105 04/16/2021   CO2 21 04/16/2021   Lab Results  Component Value Date   ALT 43 07/19/2021   AST 25 07/19/2021   ALKPHOS 68 07/19/2021   BILITOT 0.6 07/19/2021   Lab Results  Component Value Date   CHOL 129 07/19/2021   HDL 34 (L) 07/19/2021   LDLCALC 78 07/19/2021   TRIG 88 07/19/2021   CHOLHDL 3.8 07/19/2021     Lab Results  Component Value Date   HGBA1C 6.1 (H) 04/16/2021    Assessment & Plan   1. CAD: History of STEMI in 04/2011 s/p BMS to OM 3 and subsequent DES to pLAD in 03/2014.  No chest pain.  No aspirin due to chronic DOAC use. Continue beta-blocker and high-intensity statin.  2. PAF s/p ablation: S/p successful ablation 01/2016. EKG today shows NSR. No palpitations. Continue metoprolol tartrate 50 mg twice daily. Continue Eliquis 5 mg twice daily. 3. PSVT s/p remote ablation: S/p remote ablation in 2008.  No recurrence.  Continue metoprolol tartrate as above. 4. Hypertension: BP well controlled, 126/78 in office today. Continue metoprolol as above.  5. Hyperlipidemia: Lipid panel 08/04/2021 showed LDL 78, HDL 34, triglyceride 88, total cholesterol 120. Lipitor was increased to 80 mg daily at his last visit.  LDL goal less than 70. He is fasting today.  Repeat lipid panel, LFTs today.  If LDL remains above goal, consider addition of Zetia 10 mg daily.  6. Prediabetes: A1c 6.1 in May 2022.  Encouraged heart healthy diet and regular exercise. He does not have a PCP.  Provided list of available PCPs in the area and encouraged him to establish with a PCP soon. 7. Disposition: Repeat lipids, LFTs today.  Follow-up in 1 year.  Lenna Sciara, NP 11/15/2021, 10:07 AM

## 2021-11-15 ENCOUNTER — Encounter: Payer: Self-pay | Admitting: General Practice

## 2021-11-15 ENCOUNTER — Ambulatory Visit (INDEPENDENT_AMBULATORY_CARE_PROVIDER_SITE_OTHER): Payer: Federal, State, Local not specified - PPO | Admitting: Nurse Practitioner

## 2021-11-15 ENCOUNTER — Other Ambulatory Visit: Payer: Self-pay

## 2021-11-15 VITALS — BP 126/78 | HR 64 | Ht 67.0 in | Wt 161.4 lb

## 2021-11-15 DIAGNOSIS — E782 Mixed hyperlipidemia: Secondary | ICD-10-CM | POA: Diagnosis not present

## 2021-11-15 DIAGNOSIS — I480 Paroxysmal atrial fibrillation: Secondary | ICD-10-CM

## 2021-11-15 DIAGNOSIS — I471 Supraventricular tachycardia: Secondary | ICD-10-CM

## 2021-11-15 DIAGNOSIS — I10 Essential (primary) hypertension: Secondary | ICD-10-CM | POA: Diagnosis not present

## 2021-11-15 DIAGNOSIS — I2510 Atherosclerotic heart disease of native coronary artery without angina pectoris: Secondary | ICD-10-CM

## 2021-11-15 DIAGNOSIS — Z79899 Other long term (current) drug therapy: Secondary | ICD-10-CM

## 2021-11-15 DIAGNOSIS — R7303 Prediabetes: Secondary | ICD-10-CM

## 2021-11-15 NOTE — Patient Instructions (Signed)
Medication Instructions:  The current medical regimen is effective;  continue present plan and medications as directed. Please refer to the Current Medication list given to you today.  *If you need a refill on your cardiac medications before your next appointment, please call your pharmacy*  Lab Work: LIPID LFT TODAY If you have labs (blood work) drawn today and your tests are completely normal, you will receive your results only by:  MyChart Message (if you have MyChart) OR A paper copy in the mail.  If you have any lab test that is abnormal or we need to change your treatment, we will call you to review the results. You may go to any [facility] that is convenient for you however, we do have a lab in our office that is able to assist you. You DO NOT need an appointment for our lab. The lab is open 8:00am and closes at 4:00pm. Lunch 12:45 - 1:45pm.  Follow-Up: Your next appointment:  12 month(s) In Person with Peter Swaziland, MD   Please call our office 2 months in advance to schedule this appointment  :1  At [facility], you and your health needs are our priority.  As part of our continuing mission to provide you with exceptional heart care, we have created designated Provider Care Teams.  These Care Teams include your primary Cardiologist (physician) and Advanced Practice Providers (APPs -  Physician Assistants and Nurse Practitioners) who all work together to provide you with the care you need, when you need it.

## 2021-11-16 ENCOUNTER — Other Ambulatory Visit: Payer: Self-pay

## 2021-11-16 ENCOUNTER — Encounter: Payer: Self-pay | Admitting: Cardiology

## 2021-11-16 DIAGNOSIS — E782 Mixed hyperlipidemia: Secondary | ICD-10-CM

## 2021-11-16 DIAGNOSIS — I2510 Atherosclerotic heart disease of native coronary artery without angina pectoris: Secondary | ICD-10-CM

## 2021-11-16 LAB — HEPATIC FUNCTION PANEL
ALT: 42 IU/L (ref 0–44)
AST: 26 IU/L (ref 0–40)
Albumin: 4.5 g/dL (ref 3.8–4.8)
Alkaline Phosphatase: 87 IU/L (ref 44–121)
Bilirubin Total: 0.6 mg/dL (ref 0.0–1.2)
Bilirubin, Direct: 0.2 mg/dL (ref 0.00–0.40)
Total Protein: 6.6 g/dL (ref 6.0–8.5)

## 2021-11-16 LAB — LIPID PANEL
Chol/HDL Ratio: 3.9 ratio (ref 0.0–5.0)
Cholesterol, Total: 124 mg/dL (ref 100–199)
HDL: 32 mg/dL — ABNORMAL LOW (ref >39)
LDL Chol Calc (NIH): 78 mg/dL (ref 0–99)
Triglycerides: 65 mg/dL (ref 0–149)
VLDL Cholesterol Cal: 14 mg/dL (ref 5–40)

## 2021-11-16 NOTE — Telephone Encounter (Signed)
I don't recall this conversation. Zetia should not affect his Afib. Would go ahead and take it.   Osha Rane Swaziland MD, [facility]

## 2021-11-20 MED ORDER — EZETIMIBE 10 MG PO TABS: 10.0000 mg | ORAL_TABLET | Freq: Every day | ORAL | 6 refills | Status: DC

## 2021-11-20 NOTE — Telephone Encounter (Signed)
Spoke to wife . She aware of medication being added.  Request a 30 day supply to be sent to pharmacy- [facility].

## 2021-11-20 NOTE — Telephone Encounter (Signed)
-----   Message from Joylene Grapes, NP sent at 11/16/2021 12:32 PM EST ----- Lab work from yesterday's visit showed normal liver function. His cholesterol though is overall unchanged. Bad cholesterol (LDL) 78, above goal of <70. Since his cholesterol is above goal, please start Zetia 10 mg daily. Continue Atorvastatin 80 mg daily. Follow-up as scheduled.

## 2021-12-03 ENCOUNTER — Other Ambulatory Visit: Payer: Self-pay | Admitting: Cardiology

## 2021-12-03 DIAGNOSIS — I2510 Atherosclerotic heart disease of native coronary artery without angina pectoris: Secondary | ICD-10-CM

## 2021-12-03 NOTE — Telephone Encounter (Signed)
Prescription refill request for Eliquis received. Indication:Afib Last office visit:12/22 Scr:0.8 Age: 60 Weight:73.2 kg  Prescription refilled

## 2021-12-14 ENCOUNTER — Encounter: Payer: Self-pay | Admitting: Cardiology

## 2022-03-14 DIAGNOSIS — Z86010 Personal history of colonic polyps: Secondary | ICD-10-CM | POA: Diagnosis not present

## 2022-03-14 DIAGNOSIS — Z7901 Long term (current) use of anticoagulants: Secondary | ICD-10-CM | POA: Diagnosis not present

## 2022-03-14 DIAGNOSIS — K219 Gastro-esophageal reflux disease without esophagitis: Secondary | ICD-10-CM | POA: Diagnosis not present

## 2022-03-28 ENCOUNTER — Telehealth: Payer: Self-pay

## 2022-03-28 NOTE — Telephone Encounter (Signed)
? ?  Pre-operative Risk Assessment  ?  ?Patient Name: Mark [name]  ?DOB: [birth year] ?MRN: [id number]  ? ?Request for Surgical Clearance   ? ?Procedure:  Dental Extraction - Amount of Teeth to be Pulled:  1st APPT 2 TEETH (#15 AND 18) 2ND APPT 1 TOOTH #32 3rd APPT SCALING AND ROOT PLANING ? ?Date of Surgery:  Clearance TBD                              ?   ?Surgeon:  DR Ramon Dredge [facility] ?Surgeon's Group or Practice Name:  [facility] ?Phone number:  [phone] ?Fax number:  [phone] ?  ?Type of Clearance Requested:   ?- Medical  ?- Pharmacy:  Hold Apixaban (Eliquis)   ?  ?Type of Anesthesia:  Local  ?  ?Additional requests/questions:   ? ? ? ?

## 2022-03-28 NOTE — Telephone Encounter (Signed)
Primary Cardiologist:Peter Swaziland, MD ? ?Chart reviewed as part of pre-operative protocol coverage. Because of Mark [name] past medical history and time since last visit, he/she will require a virtual visit/telephone call in order to better assess preoperative cardiovascular risk. ? ?Pre-op covering staff: ?- Please contact patient, obtain consent, and schedule appointment  ? ?This message will also be routed to pharmacy pool ar input on holding anticoagulant agent as requested below so that this information is available at time of patient's appointment.  ? ?Mark Aland, NP-C ? ?  ?03/28/2022, 5:35 PM ?[facility] ?[address], [address] ?Office [phone] Fax [phone] ? ?

## 2022-03-29 ENCOUNTER — Telehealth: Payer: Self-pay | Admitting: *Deleted

## 2022-03-29 NOTE — Telephone Encounter (Signed)
DPR ok to s/w the pt's wife. Pt's wife made tele pre op appt for the pt. Med rec and consent are done. ?  ?Pt's wife did ask if Dr. Swaziland would please order xanax or something for anxiety for the pt to take before his dental procedures. Pt's wife also states the pt needs a refill on his NTG. ?  ?I assured the pt's wife that I will send a message to Dr. Elvis Coil nurse Elnita Maxwell in regard refill for NTG and request for Xanax.  Pt's wife thanked me for the call and the help today.  ?  ?

## 2022-03-29 NOTE — Telephone Encounter (Signed)
DPR ok to s/w the pt's wife. Pt's wife made tele pre op appt for the pt. Med rec and consent are done. ? ?Pt's wife did ask if Dr. [place] would please order xanax or something for anxiety for the pt to take before his dental procedures. Pt's wife also states the pt needs a refill on his NTG. ? ?I assured the pt's wife that I will send a message to Dr. Doug Sou nurse Malachy Mood in regard refill for NTG and request for Xanax.  Pt's wife thanked me for the call and the help today.  ? ?  ?Patient Consent for Virtual Visit  ? ? ?   ? ?Mark [name] has provided verbal consent on 03/29/2022 for a virtual visit (video or telephone). ? ? ?CONSENT FOR VIRTUAL VISIT FOR:  Mark [name]  ?By participating in this virtual visit I agree to the following: ? ?I hereby voluntarily request, consent and authorize [facility] and its employed or contracted physicians, physician assistants, nurse practitioners or other licensed health care professionals (the Practitioner), to provide me with telemedicine health care services (the ?Services") as deemed necessary by the treating Practitioner. I acknowledge and consent to receive the Services by the Practitioner via telemedicine. I understand that the telemedicine visit will involve communicating with the Practitioner through live audiovisual communication technology and the disclosure of certain medical information by electronic transmission. I acknowledge that I have been given the opportunity to request an in-person assessment or other available alternative prior to the telemedicine visit and am voluntarily participating in the telemedicine visit. ? ?I understand that I have the right to withhold or withdraw my consent to the use of telemedicine in the course of my care at any time, without affecting my right to future care or treatment, and that the Practitioner or I may terminate the telemedicine visit at any time. I understand that I have the right to inspect all information obtained  and/or recorded in the course of the telemedicine visit and may receive copies of available information for a reasonable fee.  I understand that some of the potential risks of receiving the Services via telemedicine include:  ?Delay or interruption in medical evaluation due to technological equipment failure or disruption; ?Information transmitted may not be sufficient (e.g. poor resolution of images) to allow for appropriate medical decision making by the Practitioner; and/or  ?In rare instances, security protocols could fail, causing a breach of personal health information. ? ?Furthermore, I acknowledge that it is my responsibility to provide information about my medical history, conditions and care that is complete and accurate to the best of my ability. I acknowledge that Practitioner's advice, recommendations, and/or decision may be based on factors not within their control, such as incomplete or inaccurate data provided by me or distortions of diagnostic images or specimens that may result from electronic transmissions. I understand that the practice of medicine is not an exact science and that Practitioner makes no warranties or guarantees regarding treatment outcomes. I acknowledge that a copy of this consent can be made available to me via my patient portal ([facility]), or I can request a printed copy by calling the office of [facility].   ? ?I understand that my insurance will be billed for this visit.  ? ?I have read or had this consent read to me. ?I understand the contents of this consent, which adequately explains the benefits and risks of the Services being provided via telemedicine.  ?I have been provided ample opportunity to  ask questions regarding this consent and the Services and have had my questions answered to my satisfaction. ?I give my informed consent for the services to be provided through the use of telemedicine in my medical care ? ? ? ?

## 2022-03-29 NOTE — Telephone Encounter (Signed)
LOOKS LIKE THE Tx PLAN IS MULTI Tx. I EXPLAINED TO THE PT'S WIFE THAT WE CANNOT PROVIDE A BLANKET TYPE CLEARANCE DUE TO BEING ON A BLOOD THINNER. EXPLAINED THAT WE WILL BE ABLE TO GIVE CLEARANCE FOR THE PROCEDURE TO BE DONE AT THE TIME. NEXT Tx PLAN WILL REQUIRE DENTAL OFFICE TO FAX OVER NEW CLEARANCE REQUEST WHEN READY FOR THE NEXT STEPS.  ?

## 2022-03-29 NOTE — Telephone Encounter (Signed)
Patient with diagnosis of Afib on Eliquis for anticoagulation.   ? ?Procedure: SCALING AND ROOT PLANING ?  ?Date of procedure: TBD ? ? ?CHA2DS2-VASc Score = 2  ? This indicates a 2.2% annual risk of stroke. ?The patient's score is based upon: ?CHF History: 0 ?HTN History: 1 ?Diabetes History: 0 ?Stroke History: 0 ?Vascular Disease History: 1 ?Age Score: 0 ?Gender Score: 0 ?  ?  ? ?CrCl 79 ml/min ? ?Patient does NOT require pre-op antibiotics for dental procedure. ? ?No need to hold anticoagulation prior to procedure # 1 and #2 (pulling of teeth) ?Patient may hold Eliquis for 1 day prior to scaling and root planning ? ?

## 2022-04-01 ENCOUNTER — Telehealth: Payer: Self-pay

## 2022-04-01 NOTE — Telephone Encounter (Signed)
Returned call to patient's wife no answer.No voice mail. 

## 2022-04-01 NOTE — Telephone Encounter (Signed)
Returned call to patient's wife no answer.Unable to leave a message no voicemail. ?

## 2022-04-02 ENCOUNTER — Ambulatory Visit (INDEPENDENT_AMBULATORY_CARE_PROVIDER_SITE_OTHER): Payer: Federal, State, Local not specified - PPO | Admitting: Physician Assistant

## 2022-04-02 ENCOUNTER — Encounter: Payer: Self-pay | Admitting: Physician Assistant

## 2022-04-02 DIAGNOSIS — Z01810 Encounter for preprocedural cardiovascular examination: Secondary | ICD-10-CM | POA: Diagnosis not present

## 2022-04-02 NOTE — Progress Notes (Signed)
? ?Virtual Visit via Telephone Note  ? ?This visit type was conducted due to national recommendations for restrictions regarding the COVID-19 Pandemic (e.g. social distancing) in an effort to limit this patient's exposure and mitigate transmission in our community.  Due to his co-morbid illnesses, this patient is at least at moderate risk for complications without adequate follow up.  This format is felt to be most appropriate for this patient at this time.  The patient did not have access to video technology/had technical difficulties with video requiring transitioning to audio format only (telephone).  All issues noted in this document were discussed and addressed.  No physical exam could be performed with this format.  Please refer to the patient's chart for his  consent to telehealth for [facility]. ?[id number] ?Evaluation Performed:  Preoperative cardiovascular risk assessment ?_____________  ? ?Date:  04/02/2022  ? ?Patient ID:  Mark [name], DOB 1961-09-01, MRN [id number] ?Patient Location:  ?Home ?Provider location:   ?Office ? ?Primary Care Provider:  Patient, No Pcp Per (Inactive) ?Primary Cardiologist:  Peter Martinique, MD ? ?Chief Complaint  ?  ?61 y.o. y/o male with a h/o CAD s/p STEMI in 04/2011, s/p BMS to OM 3 and DES to proximal LAD in 03/2014, paroxysmal atrial fibrillation s/p ablation in 01/2016 on Eliquis, SVT s/p remote ablation in 2007, hypertension, hyperlipidemia, prediabetes, prior tobacco abuse who is pending dental work, and presents today for telephonic preoperative cardiovascular risk assessment. ? ?Past Medical History  ?  ?Past Medical History:  ?Diagnosis Date  ? Atrial fibrillation ([place])   ? CAD (coronary artery disease)   ? Hyperlipidemia   ? Hypertension   ? S/P coronary artery stent placement 04/23/11  ? BMS to the 3rd OM; has residual disease  ? STEMI (ST elevation myocardial infarction) ([place]) 04/23/11  ? SVT (supraventricular tachycardia) ([place])   ? Tobacco abuse   ? ?Past Surgical  History:  ?Procedure Laterality Date  ? CORONARY STENT PLACEMENT  04/23/11  ? BMS to the 3rd OM  ? ELECTROPHYSIOLOGIC STUDY N/A 01/25/2016  ? Procedure: Atrial Fibrillation Ablation;  Surgeon: Will Meredith Leeds, MD;  Location: [facility];  Service: Cardiovascular;  Laterality: N/A;  ? LEFT HEART CATHETERIZATION WITH CORONARY ANGIOGRAM N/A 04/13/2014  ? Procedure: LEFT HEART CATHETERIZATION WITH CORONARY ANGIOGRAM;  Surgeon: Peter M Martinique, MD;  Location: [facility] CATH LAB;  Service: Cardiovascular;  Laterality: N/A;  ? PERCUTANEOUS CORONARY STENT INTERVENTION (PCI-S) Right 04/13/2014  ? Procedure: PERCUTANEOUS CORONARY STENT INTERVENTION (PCI-S);  Surgeon: Peter M Martinique, MD;  Location: [facility] CATH LAB;  Service: Cardiovascular;  Laterality: Right;  ? SVT ABLATION   2008  ? Dr. Lovena Le  ? ? ?Allergies ? ?Allergies  ?Allergen Reactions  ? Penicillins   ?  Passed out and got weak ? ?Has patient had a PCN reaction causing immediate rash, facial/tongue/throat swelling, SOB or lightheadedness with hypotension: Yes ?Has patient had a PCN reaction causing severe rash involving mucus membranes or skin necrosis: No ?Has patient had a PCN reaction that required hospitalization No ?Has patient had a PCN reaction occurring within the last 10 years: No ?If all of the above answers are "NO", then may proceed with Cephalosporin use.  ? ? ?History of Present Illness  ?  ?Mark [name] is a 61 y.o. male who presents via audio/video conferencing for a telehealth visit today.  Pt was last seen in cardiology clinic on 11/15/21 by Diona Browner NP. At that time Mark [name] was doing well.  The patient  is now pending staged dental work (1st appointment 2 teeth extracted #15 and 18, 2nd appt 1 tooth #32, and 3rd appt scaling and root planing). Since his last visit, he has been doing very well without any cardiac complaints. He has been getting out and walking regularly and denies any CP, SOB, palpitations, syncope. No recent DCCV  procedures. ? ? ?Home Medications  ?  ?Prior to Admission medications   ?Medication Sig Start Date End Date Taking? Authorizing Provider  ?atorvastatin (LIPITOR) 40 MG tablet Take 1 tablet (40 mg total) by mouth daily. 11/16/21   Martinique, Peter M, MD  ?Arne Cleveland 5 MG TABS tablet TAKE 1 TABLET BY MOUTH TWICE A DAY 12/03/21   Martinique, Peter M, MD  ?ezetimibe (ZETIA) 10 MG tablet Take 1 tablet (10 mg total) by mouth daily. ?Patient not taking: Reported on 03/29/2022 11/20/21 02/18/22  Martinique, Peter M, MD  ?metoprolol tartrate (LOPRESSOR) 50 MG tablet TAKE 1 TABLET BY MOUTH TWICE A DAY 12/03/21   Martinique, Peter M, MD  ?nitroGLYCERIN (NITROSTAT) 0.4 MG SL tablet PLACE 1 TABLET UNDER TONGUE EVERY 5 MINS AS NEEDED FOR CHEST PAIN, ON 3RD DOSE CALL 911 04/28/18   Martinique, Peter M, MD  ?Omega-3 Fatty Acids (FISH OIL) 1000 MG CAPS Take 2 capsules 2 gms twice a day 08/05/19   Martinique, Peter M, MD  ?Omega-3 Fatty Acids (FISH OIL) 1200 MG CAPS Take 2 capsules by mouth in the morning and at bedtime.    [provider]  ?omeprazole (PRILOSEC) 40 MG capsule Take 40 mg by mouth daily.  12/26/15   [provider]  ? ? ?Physical Exam  ?  ?Vital Signs:  Blessed Anderer does not have vital signs available for review today. ? ?Given telephonic nature of communication, physical exam is limited. ?AAOx3. NAD. Normal affect.  Speech and respirations are unlabored. ? ?Accessory Clinical Findings  ?  ?None ? ?Assessment & Plan  ?  ?1.  Preoperative Cardiovascular Risk Assessment: The patient affirms he has been doing well without any new cardiac symptoms. RCRI 0.9%. He is felt to be low risk for a low risk procedure. Therefore, based on ACC/AHA guidelines, the patient would be at acceptable risk for the planned procedure without further cardiovascular testing. The patient was advised that if he develops new symptoms prior to surgery to contact our office to arrange for a follow-up visit, and he verbalized understanding. ? ?Per pharmD review of  anticoagulation. ?- For appointments 1 & 2 which each require 1-2 teeth to be extracted, there is no need to hold anticoagulation prior to procedures. We do not typically stop Eliquis if 1-2 teeth are being extracted at a time. ?- For appointment 3, patient may hold Eliquis for 1 day prior to scaling and root planing. ? ?Patient does NOT require SBE prophylaxis antibiotics for dental procedure from cardiac standpoint. ? ?A copy of this note will be routed to requesting surgeon. ? ?Time:   ?Today, I have spent 5 minutes with the patient with telehealth technology discussing medical history, symptoms, and management plan.   ? ? ?Charlie Pitter, PA-C ? ?04/02/2022, 9:39 AM ? ? ?

## 2022-04-17 ENCOUNTER — Encounter: Payer: Self-pay | Admitting: Cardiology

## 2022-04-18 NOTE — Telephone Encounter (Signed)
Yes, he should check with his primary care about this ? ?Caillou Minus [place] MD, [facility] ? ?

## 2022-05-20 ENCOUNTER — Other Ambulatory Visit: Payer: Self-pay | Admitting: Cardiology

## 2022-05-20 ENCOUNTER — Encounter: Payer: Self-pay | Admitting: Cardiology

## 2022-05-20 NOTE — Telephone Encounter (Signed)
Prescription refill request for Eliquis received. Indication:Afib Last office visit:4/23 [id number] labs Age: 61 Weight:73.2 kg  Prescription refilled

## 2022-05-23 DIAGNOSIS — E782 Mixed hyperlipidemia: Secondary | ICD-10-CM | POA: Diagnosis not present

## 2022-05-23 DIAGNOSIS — I2510 Atherosclerotic heart disease of native coronary artery without angina pectoris: Secondary | ICD-10-CM | POA: Diagnosis not present

## 2022-05-23 LAB — LIPID PANEL
Chol/HDL Ratio: 3.1 ratio (ref 0.0–5.0)
Cholesterol, Total: 102 mg/dL (ref 100–199)
HDL: 33 mg/dL — ABNORMAL LOW (ref >39)
LDL Chol Calc (NIH): 53 mg/dL (ref 0–99)
Triglycerides: 75 mg/dL (ref 0–149)
VLDL Cholesterol Cal: 16 mg/dL (ref 5–40)

## 2022-05-23 LAB — HEPATIC FUNCTION PANEL
ALT: 26 IU/L (ref 0–44)
AST: 18 IU/L (ref 0–40)
Albumin: 4.3 g/dL (ref 3.8–4.8)
Alkaline Phosphatase: 71 IU/L (ref 44–121)
Bilirubin Total: 0.4 mg/dL (ref 0.0–1.2)
Bilirubin, Direct: 0.14 mg/dL (ref 0.00–0.40)
Total Protein: 6.7 g/dL (ref 6.0–8.5)

## 2022-05-27 NOTE — Progress Notes (Signed)
[name] Date of Birth: 1961/03/21 Medical Record #[id number]  History of Present Illness: Mark [name] is seen for follow up Atrial fibrillation and CAD. He has HLD, CAD, HTN, SVT with remote ablation in 2008 per Dr. Ladona Ridgel and prior history of tobacco abuse. Had STEMI in May of 2012 and was treated with BMS to the 3rd OM.  Follow up Myoview in June of 2012 was satisfactory. In April 2015 Myoview was intermediate risk. He underwent cardiac cath which showed severe stenosis in the proximal LAD. He had successful DES of the LAD. He was intolerant of Brilinta due to dyspnea and was switched to Effient.   In December 2015 he was admitted with new atrial fibrillation with RVR. Labs were normal. He converted spontaneously. Echo was normal. He was placed on Eliquis.  Plavix and ASA were stopped. He was started on diltiazem. He later developed increased symptoms of palpitations with recurrent Afib. In February 2017 he underwent AFib ablation with Dr. Elberta Fortis with excellent results.   On follow up today he reports he is doing very well. No palpitations.   Denies any chest pain, SOB, or dizziness.  He is not smoking. He states he is very active at work. Last Dec we added Zetia for LDL 78. He did not tolerate this and quit. Now walking more and watching diet. Has lost 6-7 lbs and LDL down to 53. Feels great.   Current Outpatient Medications  Medication Sig Dispense Refill   apixaban (ELIQUIS) 5 MG TABS tablet Take 1 tablet (5 mg total) by mouth 2 (two) times daily. NEEDS LABS FOR ELIQUIS REFILLS, PLEASE COME TO OFFICE 180 tablet 1   atorvastatin (LIPITOR) 40 MG tablet Take 1 tablet (40 mg total) by mouth daily. 90 tablet 3   metoprolol tartrate (LOPRESSOR) 50 MG tablet TAKE 1 TABLET BY MOUTH TWICE A DAY 180 tablet 3   nitroGLYCERIN (NITROSTAT) 0.4 MG SL tablet PLACE 1 TABLET UNDER TONGUE EVERY 5 MINS AS NEEDED FOR CHEST PAIN, ON 3RD DOSE CALL 911 25 tablet 1   Omega-3 Fatty Acids (FISH OIL) 1000 MG CAPS Take 2  capsules 2 gms twice a day 100 capsule 6   Omega-3 Fatty Acids (FISH OIL) 1200 MG CAPS Take 2 capsules by mouth in the morning and at bedtime.     omeprazole (PRILOSEC) 40 MG capsule Take 40 mg by mouth daily.   6   No current facility-administered medications for this visit.    Allergies  Allergen Reactions   Penicillins     Passed out and got weak  Has patient had a PCN reaction causing immediate rash, facial/tongue/throat swelling, SOB or lightheadedness with hypotension: Yes Has patient had a PCN reaction causing severe rash involving mucus membranes or skin necrosis: No Has patient had a PCN reaction that required hospitalization No Has patient had a PCN reaction occurring within the last 10 years: No If all of the above answers are "NO", then may proceed with Cephalosporin use.    Past Medical History:  Diagnosis Date   Atrial fibrillation ([facility])    CAD (coronary artery disease)    Hyperlipidemia    Hypertension    S/P coronary artery stent placement 04/23/11   BMS to the 3rd OM; has residual disease   STEMI (ST elevation myocardial infarction) ([facility]) 04/23/11   SVT (supraventricular tachycardia) ([facility])    Tobacco abuse     Past Surgical History:  Procedure Laterality Date   CORONARY STENT PLACEMENT  04/23/11   BMS to the  3rd OM   ELECTROPHYSIOLOGIC STUDY N/A 01/25/2016   Procedure: Atrial Fibrillation Ablation;  Surgeon: Will Meredith Leeds, MD;  Location: [facility];  Service: Cardiovascular;  Laterality: N/A;   LEFT HEART CATHETERIZATION WITH CORONARY ANGIOGRAM N/A 04/13/2014   Procedure: LEFT HEART CATHETERIZATION WITH CORONARY ANGIOGRAM;  Surgeon: Dezire Turk M Martinique, MD;  Location: [facility] CATH LAB;  Service: Cardiovascular;  Laterality: N/A;   PERCUTANEOUS CORONARY STENT INTERVENTION (PCI-S) Right 04/13/2014   Procedure: PERCUTANEOUS CORONARY STENT INTERVENTION (PCI-S);  Surgeon: Al Bracewell M Martinique, MD;  Location: [facility] CATH LAB;  Service: Cardiovascular;  Laterality: Right;   SVT  ABLATION   2008   Dr. Lovena Le    Social History   Tobacco Use  Smoking Status Former   Types: Cigarettes   Quit date: 04/23/2011   Years since quitting: 11.1  Smokeless Tobacco Former   Quit date: 04/23/2011    Social History   Substance and Sexual Activity  Alcohol Use No    Family History  Problem Relation Age of Onset   Hypertension Mother     Review of Systems: The review of systems is per the HPI.  All other systems were reviewed and are negative.  Physical Exam: BP 124/80 (BP Location: Left Arm, Patient Position: Sitting, Cuff Size: Normal)   Pulse 60   Ht 5\' 7"  (1.702 m)   Wt 154 lb (69.9 kg)   BMI 24.12 kg/m  GENERAL:  Well appearing WM in NAD HEENT:  PERRL, EOMI, sclera are clear. Oropharynx is clear. NECK:  No jugular venous distention, carotid upstroke brisk and symmetric, no bruits, no thyromegaly or adenopathy LUNGS:  Clear to auscultation bilaterally CHEST:  Unremarkable HEART:  RRR,  PMI not displaced or sustained,S1 and S2 within normal limits, no S3, no S4: no clicks, no rubs, no murmurs ABD:  Soft, nontender. BS +, no masses or bruits. No hepatomegaly, no splenomegaly EXT:  2 + pulses throughout, no edema, no cyanosis no clubbing SKIN:  Warm and dry.  No rashes NEURO:  Alert and oriented x 3. Cranial nerves II through XII intact. PSYCH:  Cognitively intact   LABORATORY DATA: Lab Results  Component Value Date   WBC 8.0 04/16/2021   HGB 15.4 04/16/2021   HCT 46.5 04/16/2021   PLT 239 04/16/2021   GLUCOSE 82 04/16/2021   CHOL 102 05/23/2022   TRIG 75 05/23/2022   HDL 33 (L) 05/23/2022   LDLCALC 53 05/23/2022   ALT 26 05/23/2022   AST 18 05/23/2022   NA 141 04/16/2021   K 4.4 04/16/2021   CL 105 04/16/2021   CREATININE 0.95 04/16/2021   BUN 13 04/16/2021   CO2 21 04/16/2021   TSH 1.458 12/11/2014   INR 1.04 12/10/2014   HGBA1C 6.1 (H) 04/16/2021   Ecg not done today shows NSR with ratee 62.    Assessment / Plan: 1. CAD - remote BMS  of the OM in 2012. S/p DES of LAD 04/13/14. He remains asymptomatic. Continue metoprolol. Not on antiplatelet therapy due to being on Eliquis.  2. Atrial fibrillation. Paroxysmal. S/p successful ablation in February 2017. Maintaining NSR. Continue eliquis and metoprolol.   3. HLD - LDL improved with dietary modification. Continue atorvastatin.   4. Tobacco abuse - he has quit years ago  5. HTN-  Well controlled on medication.   Follow up 6

## 2022-05-30 ENCOUNTER — Ambulatory Visit (INDEPENDENT_AMBULATORY_CARE_PROVIDER_SITE_OTHER): Payer: Federal, State, Local not specified - PPO | Admitting: Cardiology

## 2022-05-30 ENCOUNTER — Encounter: Payer: Self-pay | Admitting: Cardiology

## 2022-05-30 VITALS — BP 124/80 | HR 60 | Ht 67.0 in | Wt 154.0 lb

## 2022-05-30 DIAGNOSIS — E785 Hyperlipidemia, unspecified: Secondary | ICD-10-CM | POA: Diagnosis not present

## 2022-05-30 DIAGNOSIS — I10 Essential (primary) hypertension: Secondary | ICD-10-CM

## 2022-05-30 DIAGNOSIS — I480 Paroxysmal atrial fibrillation: Secondary | ICD-10-CM | POA: Diagnosis not present

## 2022-05-30 DIAGNOSIS — I2510 Atherosclerotic heart disease of native coronary artery without angina pectoris: Secondary | ICD-10-CM | POA: Diagnosis not present

## 2022-05-30 NOTE — Patient Instructions (Signed)
Medication Instructions:  °No changes ° ° °*If you need a refill on your cardiac medications before your next appointment, please call your pharmacy* ° ° °Lab Work: ° °Not needed ° ° °Testing/Procedures: °Not needed ° ° °Follow-Up: °At [facility], you and your health needs are our priority.  As part of our continuing mission to provide you with exceptional heart care, we have created designated Provider Care Teams.  These Care Teams include your primary Cardiologist (physician) and Advanced Practice Providers (APPs -  Physician Assistants and Nurse Practitioners) who all work together to provide you with the care you need, when you need it. ° °  ° °Your next appointment:   °6 month(s) ° °The format for your next appointment:   °In Person ° °Provider:   °Peter Jordan, MD  ° ° ° °

## 2022-07-18 ENCOUNTER — Telehealth: Payer: Self-pay | Admitting: Cardiology

## 2022-07-18 NOTE — Telephone Encounter (Signed)
*  STAT* If patient is at the pharmacy, call can be transferred to refill team.   1. Which medications need to be refilled? (please list name of each medication and dose if known)  need a new prescription for Atorvastatin- please give the correct dose and directions please  2. Which pharmacy/location (including street and city if local pharmacy) is medication to be sent to?[facility] [place], [place]  3. Do they need a 30 day or 90 day supply? 90 days and refill

## 2022-07-19 MED ORDER — ATORVASTATIN CALCIUM 40 MG PO TABS: 40.0000 mg | ORAL_TABLET | Freq: Every day | ORAL | 3 refills | Status: DC

## 2022-07-25 ENCOUNTER — Encounter: Payer: Self-pay | Admitting: Cardiology

## 2022-07-26 ENCOUNTER — Other Ambulatory Visit: Payer: Self-pay

## 2022-07-26 MED ORDER — NITROGLYCERIN 0.4 MG SL SUBL: SUBLINGUAL_TABLET | SUBLINGUAL | 11 refills | Status: DC

## 2022-10-31 ENCOUNTER — Other Ambulatory Visit: Payer: Self-pay | Admitting: Cardiology

## 2022-11-01 NOTE — Telephone Encounter (Signed)
Prescription refill request for Eliquis received. Indication:afib Last office visit:6/23 Scr:0.9 Age: 61 Weight:69.9  kg  Prescription refilled

## 2022-11-21 NOTE — Progress Notes (Unsigned)
Mark [name] Date of Birth: 1961/07/02 Medical Record #[id number]  History of Present Illness: Mark [name] is seen for follow up Atrial fibrillation and CAD. He has HLD, CAD, HTN, SVT with remote ablation in 2008 per Dr. Ladona Ridgel and prior history of tobacco abuse. Had STEMI in May of 2012 and was treated with BMS to the 3rd OM.  Follow up Myoview in June of 2012 was satisfactory. In April 2015 Myoview was intermediate risk. He underwent cardiac cath which showed severe stenosis in the proximal LAD. He had successful DES of the LAD. He was intolerant of Brilinta due to dyspnea and was switched to Effient.    In December 2015 he was admitted with new atrial fibrillation with RVR. Labs were normal. He converted spontaneously. Echo was normal. He was placed on Eliquis.  Plavix and ASA were stopped. He was started on diltiazem. He later developed increased symptoms of palpitations with recurrent Afib. In February 2017 he underwent AFib ablation with Dr. Elberta Fortis with excellent results.   On follow up today he reports he is doing very well. No palpitations.   Denies any chest pain, SOB, or dizziness.  He is not smoking. He states he is very active.   Current Outpatient Medications  Medication Sig Dispense Refill   apixaban (ELIQUIS) 5 MG TABS tablet Take 1 tablet (5 mg total) by mouth 2 (two) times daily. 180 tablet 1   atorvastatin (LIPITOR) 40 MG tablet Take 1 tablet (40 mg total) by mouth daily. 90 tablet 3   metoprolol tartrate (LOPRESSOR) 50 MG tablet TAKE 1 TABLET BY MOUTH TWICE A DAY 180 tablet 3   nitroGLYCERIN (NITROSTAT) 0.4 MG SL tablet PLACE 1 TABLET UNDER TONGUE EVERY 5 MINS AS NEEDED FOR CHEST PAIN, ON 3RD DOSE CALL 911 25 tablet 11   Omega-3 Fatty Acids (FISH OIL) 1000 MG CAPS Take 2 capsules 2 gms twice a day 100 capsule 6   Omega-3 Fatty Acids (FISH OIL) 1200 MG CAPS Take 2 capsules by mouth in the morning and at bedtime.     omeprazole (PRILOSEC) 40 MG capsule Take 40 mg by mouth daily.   6    No current facility-administered medications for this visit.    Allergies  Allergen Reactions   Penicillins     Passed out and got weak  Has patient had a PCN reaction causing immediate rash, facial/tongue/throat swelling, SOB or lightheadedness with hypotension: Yes Has patient had a PCN reaction causing severe rash involving mucus membranes or skin necrosis: No Has patient had a PCN reaction that required hospitalization No Has patient had a PCN reaction occurring within the last 10 years: No If all of the above answers are "NO", then may proceed with Cephalosporin use.    Past Medical History:  Diagnosis Date   Atrial fibrillation (HCC)    CAD (coronary artery disease)    Hyperlipidemia    Hypertension    S/P coronary artery stent placement 04/23/11   BMS to the 3rd OM; has residual disease   STEMI (ST elevation myocardial infarction) ([facility]) 04/23/11   SVT (supraventricular tachycardia)    Tobacco abuse     Past Surgical History:  Procedure Laterality Date   CORONARY STENT PLACEMENT  04/23/11   BMS to the 3rd OM   ELECTROPHYSIOLOGIC STUDY N/A 01/25/2016   Procedure: Atrial Fibrillation Ablation;  Surgeon: Will [name], MD;  Location: [facility] INVASIVE CV LAB;  Service: Cardiovascular;  Laterality: N/A;   LEFT HEART CATHETERIZATION WITH CORONARY ANGIOGRAM N/A 04/13/2014  Procedure: LEFT HEART CATHETERIZATION WITH CORONARY ANGIOGRAM;  Surgeon: Coby Shrewsberry M Swaziland, MD;  Location: [facility] CATH LAB;  Service: Cardiovascular;  Laterality: N/A;   PERCUTANEOUS CORONARY STENT INTERVENTION (PCI-S) Right 04/13/2014   Procedure: PERCUTANEOUS CORONARY STENT INTERVENTION (PCI-S);  Surgeon: Jessieca Rhem M Sw[place]and, MD;  Location: [facility] CATH LAB;  Service: Cardiovascular;  Laterality: Right;   SVT ABLATION   2008   Dr. Ladona Ridgel    Social History   Tobacco Use  Smoking Status Former   Types: Cigarettes   Quit date: 04/23/2011   Years since quitting: 11.6  Smokeless Tobacco Former   Quit date: 04/23/2011     Social History   Substance and Sexual Activity  Alcohol Use No    Family History  Problem Relation Age of Onset   Hypertension Mother     Review of Systems: The review of systems is per the HPI.  All other systems were reviewed and are negative.  Physical Exam: BP 138/80   Pulse (!) 58   Ht 5\' 7"  (1.702 m)   Wt 166 lb 9.6 oz (75.6 kg)   SpO2 98%   BMI 26.09 kg/m  GENERAL:  Well appearing WM in NAD HEENT:  PERRL, EOMI, sclera are clear. Oropharynx is clear. NECK:  No jugular venous distention, carotid upstroke brisk and symmetric, no bruits, no thyromegaly or adenopathy LUNGS:  Clear to auscultation bilaterally CHEST:  Unremarkable HEART:  RRR,  PMI not displaced or sustained,S1 and S2 within normal limits, no S3, no S4: no clicks, no rubs, no murmurs ABD:  Soft, nontender. BS +, no masses or bruits. No hepatomegaly, no splenomegaly EXT:  2 + pulses throughout, no edema, no cyanosis no clubbing SKIN:  Warm and dry.  No rashes NEURO:  Alert and oriented x 3. Cranial nerves II through XII intact. PSYCH:  Cognitively intact   LABORATORY DATA: Lab Results  Component Value Date   WBC 8.0 04/16/2021   HGB 15.4 04/16/2021   HCT 46.5 04/16/2021   PLT 239 04/16/2021   GLUCOSE 82 04/16/2021   CHOL 102 05/23/2022   TRIG 75 05/23/2022   HDL 33 (L) 05/23/2022   LDLCALC 53 05/23/2022   ALT 26 05/23/2022   AST 18 05/23/2022   NA 141 04/16/2021   K 4.4 04/16/2021   CL 105 04/16/2021   CREATININE 0.95 04/16/2021   BUN 13 04/16/2021   CO2 21 04/16/2021   TSH 1.458 12/11/2014   INR 1.04 12/10/2014   HGBA1C 6.1 (H) 04/16/2021   Ecg not done today shows NSR with rate 58. Normal. I have personally reviewed and interpreted this study.    Assessment / Plan: 1. CAD - remote BMS of the OM in 2012. S/p DES of LAD 04/13/14. He remains asymptomatic. Continue metoprolol. Not on antiplatelet therapy due to being on Eliquis.  2. Atrial fibrillation. Paroxysmal. S/p successful  ablation in February 2017. Maintaining NSR. Continue eliquis and metoprolol.   3. HLD - LDL improved with dietary modification. Continue atorvastatin. Last LDL at goal 53  4. Tobacco abuse - he has quit years ago  5. HTN-  Well controlled on medication.   Follow up 6  with labs

## 2022-11-29 ENCOUNTER — Other Ambulatory Visit: Payer: Self-pay

## 2022-11-29 ENCOUNTER — Ambulatory Visit: Payer: Federal, State, Local not specified - PPO | Attending: Cardiology | Admitting: Cardiology

## 2022-11-29 ENCOUNTER — Encounter: Payer: Self-pay | Admitting: Cardiology

## 2022-11-29 VITALS — BP 138/80 | HR 58 | Ht 67.0 in | Wt 166.6 lb

## 2022-11-29 DIAGNOSIS — I2510 Atherosclerotic heart disease of native coronary artery without angina pectoris: Secondary | ICD-10-CM

## 2022-11-29 DIAGNOSIS — I10 Essential (primary) hypertension: Secondary | ICD-10-CM

## 2022-11-29 DIAGNOSIS — I480 Paroxysmal atrial fibrillation: Secondary | ICD-10-CM | POA: Diagnosis not present

## 2022-11-29 DIAGNOSIS — E785 Hyperlipidemia, unspecified: Secondary | ICD-10-CM

## 2022-11-30 ENCOUNTER — Other Ambulatory Visit: Payer: Self-pay | Admitting: Cardiology

## 2022-11-30 DIAGNOSIS — I2510 Atherosclerotic heart disease of native coronary artery without angina pectoris: Secondary | ICD-10-CM

## 2023-03-26 DIAGNOSIS — M109 Gout, unspecified: Secondary | ICD-10-CM | POA: Diagnosis not present

## 2023-04-21 ENCOUNTER — Other Ambulatory Visit: Payer: Self-pay | Admitting: Cardiology

## 2023-04-21 NOTE — Telephone Encounter (Signed)
Pt has scheduled appt on 06/16/23 with Dr Sw[place]and. Called and spoke with pt's spouse and confirmed pt can have labs drawn at upcoming appt. Pt's spouse verbalized understanding. Refill sent to prevent any missed doses until upcoming appt.

## 2023-04-21 NOTE — Telephone Encounter (Addendum)
Prescription refill request for Eliquis received. Indication: Afib  Last office visit: 11/29/22 ([place])  Scr:  Age: 62 Weight: 75.6kg  Labs overdue. Called pt to determine if labs have been drawn at another office or schedule labs, no answer. Left detailed message on voicemail with call back number.

## 2023-06-11 DIAGNOSIS — I2510 Atherosclerotic heart disease of native coronary artery without angina pectoris: Secondary | ICD-10-CM | POA: Diagnosis not present

## 2023-06-11 DIAGNOSIS — I10 Essential (primary) hypertension: Secondary | ICD-10-CM | POA: Diagnosis not present

## 2023-06-11 DIAGNOSIS — E785 Hyperlipidemia, unspecified: Secondary | ICD-10-CM | POA: Diagnosis not present

## 2023-06-11 DIAGNOSIS — I480 Paroxysmal atrial fibrillation: Secondary | ICD-10-CM | POA: Diagnosis not present

## 2023-06-12 LAB — CBC WITH DIFFERENTIAL/PLATELET
Basophils Absolute: 0 10*3/uL (ref 0.0–0.2)
Basos: 1 %
EOS (ABSOLUTE): 0.2 10*3/uL (ref 0.0–0.4)
Eos: 4 %
Hematocrit: 46.4 % (ref 37.5–51.0)
Hemoglobin: 15.8 g/dL (ref 13.0–17.7)
Immature Grans (Abs): 0 10*3/uL (ref 0.0–0.1)
Immature Granulocytes: 0 %
Lymphocytes Absolute: 1.6 10*3/uL (ref 0.7–3.1)
Lymphs: 29 %
MCH: 30.4 pg (ref 26.6–33.0)
MCHC: 34.1 g/dL (ref 31.5–35.7)
MCV: 89 fL (ref 79–97)
Monocytes Absolute: 0.4 10*3/uL (ref 0.1–0.9)
Monocytes: 8 %
Neutrophils Absolute: 3.3 10*3/uL (ref 1.4–7.0)
Neutrophils: 58 %
Platelets: 227 10*3/uL (ref 150–450)
RBC: 5.2 x10E6/uL (ref 4.14–5.80)
RDW: 12.7 % (ref 11.6–15.4)
WBC: 5.6 10*3/uL (ref 3.4–10.8)

## 2023-06-12 LAB — BASIC METABOLIC PANEL
BUN/Creatinine Ratio: 15 (ref 10–24)
BUN: 14 mg/dL (ref 8–27)
CO2: 24 mmol/L (ref 20–29)
Calcium: 9.5 mg/dL (ref 8.6–10.2)
Chloride: 108 mmol/L — ABNORMAL HIGH (ref 96–106)
Creatinine, Ser: 0.92 mg/dL (ref 0.76–1.27)
Glucose: 108 mg/dL — ABNORMAL HIGH (ref 70–99)
Potassium: 4.5 mmol/L (ref 3.5–5.2)
Sodium: 143 mmol/L (ref 134–144)
eGFR: 94 mL/min/{1.73_m2} (ref >59)

## 2023-06-12 LAB — LIPID PANEL
Chol/HDL Ratio: 4 ratio (ref 0.0–5.0)
Cholesterol, Total: 135 mg/dL (ref 100–199)
HDL: 34 mg/dL — ABNORMAL LOW (ref >39)
LDL Chol Calc (NIH): 83 mg/dL (ref 0–99)
Triglycerides: 95 mg/dL (ref 0–149)
VLDL Cholesterol Cal: 18 mg/dL (ref 5–40)

## 2023-06-12 LAB — HEPATIC FUNCTION PANEL
ALT: 40 IU/L (ref 0–44)
AST: 25 IU/L (ref 0–40)
Albumin: 4.5 g/dL (ref 3.9–4.9)
Alkaline Phosphatase: 72 IU/L (ref 44–121)
Bilirubin Total: 0.6 mg/dL (ref 0.0–1.2)
Bilirubin, Direct: 0.16 mg/dL (ref 0.00–0.40)
Total Protein: 6.7 g/dL (ref 6.0–8.5)

## 2023-06-12 NOTE — Progress Notes (Signed)
Mark [name] Date of Birth: July 03, 1961 Medical Record #[id number]  History of Present Illness: Mark [name] is seen for follow up Atrial fibrillation and CAD. He has HLD, CAD, HTN, SVT with remote ablation in 2008 per Dr. Ladona Ridgel and prior history of tobacco abuse. Had STEMI in May of 2012 and was treated with BMS to the 3rd OM.  Follow up Myoview in June of 2012 was satisfactory. In April 2015 Myoview was intermediate risk. He underwent cardiac cath which showed severe stenosis in the proximal LAD. He had successful DES of the LAD. He was intolerant of Brilinta due to dyspnea and was switched to Effient.    In December 2015 he was admitted with new atrial fibrillation with RVR. Labs were normal. He converted spontaneously. Echo was normal. He was placed on Eliquis.  Plavix and ASA were stopped. He was started on diltiazem. He later developed increased symptoms of palpitations with recurrent Afib. In February 2017 he underwent AFib ablation with Dr. Elberta Fortis with excellent results.   On follow up today he reports he is doing very well. No palpitations.   Denies any chest pain, SOB, or dizziness.  He is not smoking. He states he is very active.   Current Outpatient Medications  Medication Sig Dispense Refill   apixaban (ELIQUIS) 5 MG TABS tablet TAKE 1 TABLET BY MOUTH TWICE A DAY 180 tablet 0   atorvastatin (LIPITOR) 40 MG tablet Take 1 tablet (40 mg total) by mouth daily. 90 tablet 3   metoprolol tartrate (LOPRESSOR) 50 MG tablet TAKE 1 TABLET BY MOUTH TWICE A DAY 180 tablet 3   omeprazole (PRILOSEC) 40 MG capsule Take 40 mg by mouth daily.   6   nitroGLYCERIN (NITROSTAT) 0.4 MG SL tablet PLACE 1 TABLET UNDER TONGUE EVERY 5 MINS AS NEEDED FOR CHEST PAIN, ON 3RD DOSE CALL 911 (Patient not taking: Reported on 06/16/2023) 25 tablet 11   Omega-3 Fatty Acids (FISH OIL) 1200 MG CAPS Take 2 capsules (2,400 mg total) by mouth in the morning and at bedtime.     No current facility-administered medications for  this visit.    Allergies  Allergen Reactions   Penicillins     Passed out and got weak  Has patient had a PCN reaction causing immediate rash, facial/tongue/throat swelling, SOB or lightheadedness with hypotension: Yes Has patient had a PCN reaction causing severe rash involving mucus membranes or skin necrosis: No Has patient had a PCN reaction that required hospitalization No Has patient had a PCN reaction occurring within the last 10 years: No If all of the above answers are "NO", then may proceed with Cephalosporin use.    Past Medical History:  Diagnosis Date   Atrial fibrillation (HCC)    CAD (coronary artery disease)    Hyperlipidemia    Hypertension    S/P coronary artery stent placement 04/23/11   BMS to the 3rd OM; has residual disease   STEMI (ST elevation myocardial infarction) (HCC) 04/23/11   SVT (supraventricular tachycardia)    Tobacco abuse     Past Surgical History:  Procedure Laterality Date   CORONARY STENT PLACEMENT  04/23/11   BMS to the 3rd OM   ELECTROPHYSIOLOGIC STUDY N/A 01/25/2016   Procedure: Atrial Fibrillation Ablation;  Surgeon: Will [name], MD;  Location: [facility] INVASIVE CV LAB;  Service: Cardiovascular;  Laterality: N/A;   LEFT HEART CATHETERIZATION WITH CORONARY ANGIOGRAM N/A 04/13/2014   Procedure: LEFT HEART CATHETERIZATION WITH CORONARY ANGIOGRAM;  Surgeon: Nyelle Wolfson M [place], MD;  Location:  [facility] CATH LAB;  Service: Cardiovascular;  Laterality: N/A;   PERCUTANEOUS CORONARY STENT INTERVENTION (PCI-S) Right 04/13/2014   Procedure: PERCUTANEOUS CORONARY STENT INTERVENTION (PCI-S);  Surgeon: Audrianna Driskill M [place], MD;  Location: [facility] CATH LAB;  Service: Cardiovascular;  Laterality: Right;   SVT ABLATION   2008   Dr. Ladona Ridgel    Social History   Tobacco Use  Smoking Status Former   Types: Cigarettes   Quit date: 04/23/2011   Years since quitting: 12.1  Smokeless Tobacco Former   Quit date: 04/23/2011    Social History   Substance and Sexual Activity   Alcohol Use No    Family History  Problem Relation Age of Onset   Hypertension Mother     Review of Systems: The review of systems is per the HPI.  All other systems were reviewed and are negative.  Physical Exam: BP 128/78   Pulse 64   Ht 5\' 7"  (1.702 m)   Wt 167 lb 6.4 oz (75.9 kg)   SpO2 94%   BMI 26.22 kg/m  GENERAL:  Well appearing WM in NAD HEENT:  PERRL, EOMI, sclera are clear. Oropharynx is clear. NECK:  No jugular venous distention, carotid upstroke brisk and symmetric, no bruits, no thyromegaly or adenopathy LUNGS:  Clear to auscultation bilaterally CHEST:  Unremarkable HEART:  RRR,  PMI not displaced or sustained,S1 and S2 within normal limits, no S3, no S4: no clicks, no rubs, no murmurs ABD:  Soft, nontender. BS +, no masses or bruits. No hepatomegaly, no splenomegaly EXT:  2 + pulses throughout, no edema, no cyanosis no clubbing SKIN:  Warm and dry.  No rashes NEURO:  Alert and oriented x 3. Cranial nerves II through XII intact. PSYCH:  Cognitively intact   LABORATORY DATA: Lab Results  Component Value Date   WBC 5.6 06/11/2023   HGB 15.8 06/11/2023   HCT 46.4 06/11/2023   PLT 227 06/11/2023   GLUCOSE 108 (H) 06/11/2023   CHOL 135 06/11/2023   TRIG 95 06/11/2023   HDL 34 (L) 06/11/2023   LDLCALC 83 06/11/2023   ALT 40 06/11/2023   AST 25 06/11/2023   NA 143 06/11/2023   K 4.5 06/11/2023   CL 108 (H) 06/11/2023   CREATININE 0.92 06/11/2023   BUN 14 06/11/2023   CO2 24 06/11/2023   TSH 1.458 12/11/2014   INR 1.04 12/10/2014   HGBA1C 6.1 (H) 04/16/2021   Ecg not done today     Assessment / Plan: 1. CAD - remote BMS of the OM in 2012. S/p DES of LAD 04/13/14. He remains asymptomatic. Continue metoprolol. Not on antiplatelet therapy due to being on Eliquis.  2. Atrial fibrillation. Paroxysmal. S/p successful ablation in February 2017. Maintaining NSR. Continue eliquis and metoprolol.   3. HLD - LDL had gone up to 83. Last LDL at goal 53. Will  continue current lipitor dose for now. Work on lifestyle modification. Repeat lab in 6 months and if not at goal would add Zetia.   4. Tobacco abuse - he has quit years ago  5. HTN-  Well controlled on medication.   Follow up 6  with labs

## 2023-06-13 ENCOUNTER — Other Ambulatory Visit: Payer: Self-pay

## 2023-06-13 DIAGNOSIS — I2510 Atherosclerotic heart disease of native coronary artery without angina pectoris: Secondary | ICD-10-CM

## 2023-06-13 DIAGNOSIS — E782 Mixed hyperlipidemia: Secondary | ICD-10-CM

## 2023-06-16 ENCOUNTER — Ambulatory Visit: Payer: Federal, State, Local not specified - PPO | Attending: Cardiology | Admitting: Cardiology

## 2023-06-16 ENCOUNTER — Encounter: Payer: Self-pay | Admitting: Cardiology

## 2023-06-16 VITALS — BP 128/78 | HR 64 | Ht 67.0 in | Wt 167.4 lb

## 2023-06-16 DIAGNOSIS — I480 Paroxysmal atrial fibrillation: Secondary | ICD-10-CM | POA: Diagnosis not present

## 2023-06-16 DIAGNOSIS — E785 Hyperlipidemia, unspecified: Secondary | ICD-10-CM | POA: Diagnosis not present

## 2023-06-16 DIAGNOSIS — I2510 Atherosclerotic heart disease of native coronary artery without angina pectoris: Secondary | ICD-10-CM

## 2023-06-16 MED ORDER — FISH OIL 1200 MG PO CAPS: 2.0000 | ORAL_CAPSULE | Freq: Two times a day (BID) | ORAL | Status: AC

## 2023-06-16 NOTE — Addendum Note (Signed)
Addended by: Neoma Laming on: 06/16/2023 08:43 AM   Modules accepted: Orders

## 2023-06-16 NOTE — Patient Instructions (Signed)
Medication Instructions:  Continue same medications *If you need a refill on your cardiac medications before your next appointment, please call your pharmacy*   Lab Work: Fasting lab bmet,lipid and hepatic panels in 6 months   Testing/Procedures: None ordered   Follow-Up: At [facility], you and your health needs are our priority.  As part of our continuing mission to provide you with exceptional heart care, we have created designated Provider Care Teams.  These Care Teams include your primary Cardiologist (physician) and Advanced Practice Providers (APPs -  Physician Assistants and Nurse Practitioners) who all work together to provide you with the care you need, when you need it.  We recommend signing up for the patient portal called "MyChart".  Sign up information is provided on this After Visit Summary.  MyChart is used to connect with patients for Virtual Visits (Telemedicine).  Patients are able to view lab/test results, encounter notes, upcoming appointments, etc.  Non-urgent messages can be sent to your provider as well.   To learn more about what you can do with MyChart, go to [email or web address].    Your next appointment:  6 months   Call in Sept to schedule Jan appointment    Provider:  Dr.Jordan

## 2023-07-16 ENCOUNTER — Other Ambulatory Visit: Payer: Self-pay | Admitting: Cardiology

## 2023-07-23 ENCOUNTER — Other Ambulatory Visit: Payer: Self-pay | Admitting: Cardiology

## 2023-07-23 DIAGNOSIS — I480 Paroxysmal atrial fibrillation: Secondary | ICD-10-CM

## 2023-07-23 NOTE — Telephone Encounter (Signed)
Prescription refill request for Eliquis received. Indication: Afib  Last office visit: 06/16/23 ([place])  Scr: 0.92 (06/11/23)  Age: 62 Weight: 75.9kg  Appropriate dose. Refill sent.

## 2023-07-23 NOTE — Telephone Encounter (Signed)
Please review

## 2023-11-28 ENCOUNTER — Other Ambulatory Visit: Payer: Self-pay | Admitting: Cardiology

## 2023-11-28 DIAGNOSIS — I2510 Atherosclerotic heart disease of native coronary artery without angina pectoris: Secondary | ICD-10-CM

## 2023-12-16 ENCOUNTER — Ambulatory Visit: Payer: Federal, State, Local not specified - PPO | Admitting: Physician Assistant

## 2023-12-21 NOTE — Progress Notes (Signed)
 Cardiology Office Note:  .   Date:  12/21/2023  ID:  Juliane Holm, DOB [birth year], MRN [id number] PCP: Patient, No Pcp Per  [facility] Providers Cardiologist:  Peter Jordan, MD Electrophysiologist:  Soyla Gladis Norton, MD {  History of Present Illness: .   Surafel Hilleary is a 63 y.o. male w/PMHx of CAD (STEMI May 2012 PCI to OM, PCI to LAD 2015), HTN, HLD, Afib  He last saw Dr. Vana service back in 2017, post AFib ablation , doing well planned for an annual visit  Seeing Dr. Sierra team since then. Last w/Dr. Jordan on 06/16/23, no reported symptoms, reported being very active  Today's visit is scheduled for c/o heart fluttering  ROS:   He is accompanied by his wife In the last month or so he has had a few episodes, maybe 4 times that have felt like his Afib used to. Not racing like it did before, but a sense of fluttering making him feel unwell, anxious, off. 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 Sinus rhythm upon presentation.   2. Successful electrical isolation and anatomical encircling of all four pulmonary veins with radiofrequency current. 3. No inducible arrhythmias following ablation. 4. No early apparent complications.   12/29/2014:  TTE Study Conclusions  - Left ventricle: The cavity size was normal. Systolic function was    normal. The estimated ejection fraction was in the range of 55%    to 60%. Wall motion was normal; there were no regional wall    motion abnormalities. Left ventricular diastolic function    parameters were normal.   Risk Assessment/Calculations:    Physical Exam:   VS:  There were no vitals taken for this visit.   Wt Readings from Last 3 Encounters:  06/16/23 167 lb 6.4 oz (75.9 kg)  11/29/22 166 lb 9.6 oz (75.6 kg)  05/30/22 154 lb (69.9 kg)    GEN: Well nourished, well developed in no acute distress NECK: No JVD; No carotid bruits CARDIAC: RRR, no murmurs, rubs, gallops RESPIRATORY:  CTA b/l without rales, wheezing or rhonchi  ABDOMEN: Soft, non-tender, non-distended EXTREMITIES:  No edema; No deformity    ASSESSMENT AND PLAN: .    paroxysmal AFib CHA2DS2Vasc is 2, on Eliquis , appropriately dosed New/recurrent palpitations/symptoms reminiscent of his AFib, but not exactly Some have been associated with lightheadedness  Recommend monitor to evaluate further If infact is Afib we could consider repeat ablation given he is quite symptomatic, though think reasonable to assess for any other arrhythmia 1st  He would be on board with re-do ablation  Advised that he take a PRN 1/2 tab (25mg ) of his lopressor  Q6 for palpitations and not use his wife's clonazepam  If burden is elevated might consider AAD to bridge an ablation procedure (pending monitor findings)  CAD No anginal symptoms C/w Dr. Elston, sees him soon  HTN A recheck is 142/80, reports better typically, always higher at the office  Secondary hypercoagulable state 2/2 AFib  Dispo: back in 4-6 weeks to see Dr. Inocencio, decide plan  Signed, Charlies Macario Arthur, PA-C

## 2023-12-23 ENCOUNTER — Encounter: Payer: Self-pay | Admitting: Physician Assistant

## 2023-12-23 ENCOUNTER — Ambulatory Visit: Payer: Federal, State, Local not specified - PPO | Attending: Physician Assistant | Admitting: Physician Assistant

## 2023-12-23 VITALS — BP 158/80 | HR 71 | Ht 67.0 in | Wt 169.8 lb

## 2023-12-23 DIAGNOSIS — I480 Paroxysmal atrial fibrillation: Secondary | ICD-10-CM

## 2023-12-23 DIAGNOSIS — I2510 Atherosclerotic heart disease of native coronary artery without angina pectoris: Secondary | ICD-10-CM | POA: Diagnosis not present

## 2023-12-23 DIAGNOSIS — R42 Dizziness and giddiness: Secondary | ICD-10-CM | POA: Diagnosis not present

## 2023-12-23 DIAGNOSIS — I10 Essential (primary) hypertension: Secondary | ICD-10-CM

## 2023-12-23 DIAGNOSIS — E785 Hyperlipidemia, unspecified: Secondary | ICD-10-CM | POA: Diagnosis not present

## 2023-12-23 DIAGNOSIS — D6869 Other thrombophilia: Secondary | ICD-10-CM

## 2023-12-23 LAB — BASIC METABOLIC PANEL
BUN/Creatinine Ratio: 16 (ref 10–24)
BUN: 13 mg/dL (ref 8–27)
CO2: 26 mmol/L (ref 20–29)
Calcium: 9.7 mg/dL (ref 8.6–10.2)
Chloride: 106 mmol/L (ref 96–106)
Creatinine, Ser: 0.82 mg/dL (ref 0.76–1.27)
Glucose: 125 mg/dL — ABNORMAL HIGH (ref 70–99)
Potassium: 5.4 mmol/L — ABNORMAL HIGH (ref 3.5–5.2)
Sodium: 145 mmol/L — ABNORMAL HIGH (ref 134–144)
eGFR: 99 mL/min/{1.73_m2} (ref >59)

## 2023-12-23 LAB — HEPATIC FUNCTION PANEL
ALT: 51 [IU]/L — ABNORMAL HIGH (ref 0–44)
AST: 24 [IU]/L (ref 0–40)
Albumin: 4.5 g/dL (ref 3.9–4.9)
Alkaline Phosphatase: 84 [IU]/L (ref 44–121)
Bilirubin Total: 0.6 mg/dL (ref 0.0–1.2)
Bilirubin, Direct: 0.18 mg/dL (ref 0.00–0.40)
Total Protein: 6.9 g/dL (ref 6.0–8.5)

## 2023-12-23 LAB — LIPID PANEL
Chol/HDL Ratio: 4 {ratio} (ref 0.0–5.0)
Cholesterol, Total: 135 mg/dL (ref 100–199)
HDL: 34 mg/dL — ABNORMAL LOW (ref >39)
LDL Chol Calc (NIH): 73 mg/dL (ref 0–99)
Triglycerides: 163 mg/dL — ABNORMAL HIGH (ref 0–149)
VLDL Cholesterol Cal: 28 mg/dL (ref 5–40)

## 2023-12-23 MED ORDER — METOPROLOL TARTRATE 50 MG PO TABS: 50.0000 mg | ORAL_TABLET | Freq: Two times a day (BID) | ORAL | 2 refills | Status: DC

## 2023-12-23 NOTE — Patient Instructions (Addendum)
 Medication Instructions:   CONTINUE  TAKING METOPROLOL  : 50 MG TWICE A DAY  YOU MAY TAKE AN ADDITIONAL : 25 MG (   HALF TABLET ) AS NEEDED FOR PALPITATIONS.  *If you need a refill on your cardiac medications before your next appointment, please call your pharmacy*   Lab Work: NONE ORDERED  TODAY   If you have labs (blood work) drawn today and your tests are completely normal, you will receive your results only by: MyChart Message (if you have MyChart) OR A paper copy in the mail If you have any lab test that is abnormal or we need to change your treatment, we will call you to review the results.   Testing/Procedures: Your physician has recommended that you wear an event monitor. Event monitors are medical devices that record the heart's electrical activity. Doctors most often us  these monitors to diagnose arrhythmias. Arrhythmias are problems with the speed or rhythm of the heartbeat. The monitor is a small, portable device. You can wear one while you do your normal daily activities. This is usually used to diagnose what is causing palpitations/syncope (passing out).    Follow-Up: At [facility], you and your health needs are our priority.  As part of our continuing mission to provide you with exceptional heart care, we have created designated Provider Care Teams.  These Care Teams include your primary Cardiologist (physician) and Advanced Practice Providers (APPs -  Physician Assistants and Nurse Practitioners) who all work together to provide you with the care you need, when you need it.  We recommend signing up for the patient portal called MyChart.  Sign up information is provided on this After Visit Summary.  MyChart is used to connect with patients for Virtual Visits (Telemedicine).  Patients are able to view lab/test results, encounter notes, upcoming appointments, etc.  Non-urgent messages can be sent to your provider as well.   To learn more about what you can do with  MyChart, go to [email or web address].    Your next appointment:    6 week(s)  ( CONTACT  CASSIE HALL/ ANGELINE HAMMER FOR EP SCHEDULING ISSUES )   Provider:    Soyla Norton, MD    Other Instructions  ZIO AT Long term monitor-Live Telemetry  Your physician has requested you wear a ZIO patch monitor for 14 days.  This is a single patch monitor. [employer] supplies one patch monitor per enrollment. Additional  stickers are not available.  Please do not apply patch if you will be having a Nuclear Stress Test, Echocardiogram, Cardiac CT, MRI,  or Chest Xray during the period you would be wearing the monitor. The patch cannot be worn during  these tests. You cannot remove and re-apply the ZIO AT patch monitor.  Your ZIO patch monitor will be mailed 3 day USPS to your address on file. It may take 3-5 days to  receive your monitor after you have been enrolled.  Once you have received your monitor, please review the enclosed instructions. Your monitor has  already been registered assigning a specific monitor serial # to you.   Billing and Patient Assistance Program information  Meredeth has been supplied with any insurance information on record for billing. [employer] offers a sliding scale Patient Assistance Program for patients without insurance, or whose  insurance does not completely cover the cost of the ZIO patch monitor. You must apply for the  Patient Assistance Program to qualify for the discounted rate. To apply, call [employer] at [phone],  select  option 4, select option 2 , ask to apply for the Patient Assistance Program, (you can request an  interpreter if needed). [employer] will ask your household income and how many people are in your  household. [employer] will quote your out-of-pocket cost based on this information. They will also be able  to set up a 12 month interest free payment plan if needed.  Applying the monitor   Shave hair from upper left chest.  Hold the abrader disc  by orange tab. Rub the abrader in 40 strokes over left upper chest as indicated in  your monitor instructions.  Clean area with 4 enclosed alcohol pads. Use all pads to ensure the area is cleaned thoroughly. Let  dry.  Apply patch as indicated in monitor instructions. Patch will be placed under collarbone on left side of  chest with arrow pointing upward.  Rub patch adhesive wings for 2 minutes. Remove the white label marked 1. Remove the white label  marked 2. Rub patch adhesive wings for 2 additional minutes.  While looking in a mirror, press and release button in center of patch. A small green light will flash 3-4  times. This will be your only indicator that the monitor has been turned on.  Do not shower for the first 24 hours. You may shower after the first 24 hours.  Press the button if you feel a symptom. You will hear a small click. Record Date, Time and Symptom in  the Patient Log.   Starting the Gateway  In your kit there is a audiological scientist box the size of a cellphone. This is [employer]. It transmits all your  recorded data to [facility]. This box must always stay within 10 feet of you. Open the box and push the *  button. There will be a light that blinks orange and then green a few times. When the light stops  blinking, the Gateway is connected to the ZIO patch. Call [employer] at [phone] to confirm your monitor is transmitting.  Returning your monitor  Remove your patch and place it inside the Gateway. In the lower half of the Gateway there is a white  bag with prepaid postage on it. Place Gateway in bag and seal. Mail package back to [place] as soon as  possible. Your physician should have your final report approximately 7 days after you have mailed back  your monitor. Call [facility] Customer Care at [phone] if you have questions regarding your ZIO AT  patch monitor. Call them immediately if you see an orange light blinking on your monitor.  If your  monitor falls off in less than 4 days, contact our Monitor department at [phone]. If your  monitor becomes loose or falls off after 4 days call [employer] at [phone] for suggestions on  securing your monitor

## 2023-12-25 ENCOUNTER — Other Ambulatory Visit: Payer: Self-pay

## 2023-12-25 DIAGNOSIS — E875 Hyperkalemia: Secondary | ICD-10-CM

## 2023-12-27 DIAGNOSIS — R42 Dizziness and giddiness: Secondary | ICD-10-CM

## 2023-12-30 DIAGNOSIS — E875 Hyperkalemia: Secondary | ICD-10-CM | POA: Diagnosis not present

## 2023-12-30 LAB — BASIC METABOLIC PANEL
BUN/Creatinine Ratio: 20 (ref 10–24)
BUN: 18 mg/dL (ref 8–27)
CO2: 22 mmol/L (ref 20–29)
Calcium: 9.7 mg/dL (ref 8.6–10.2)
Chloride: 108 mmol/L — ABNORMAL HIGH (ref 96–106)
Creatinine, Ser: 0.88 mg/dL (ref 0.76–1.27)
Glucose: 99 mg/dL (ref 70–99)
Potassium: 4.7 mmol/L (ref 3.5–5.2)
Sodium: 146 mmol/L — ABNORMAL HIGH (ref 134–144)
eGFR: 97 mL/min/{1.73_m2} (ref >59)

## 2023-12-31 ENCOUNTER — Telehealth: Payer: Self-pay | Admitting: Cardiology

## 2023-12-31 NOTE — Telephone Encounter (Signed)
 Received call from [employer] monitoring company for event 1/14 1352 EST for 21 beats of NSVT.  No further episodes.  I called and spoke with the patient and he reports feeling palpitations at the time but no other symptoms. Remains on metoprolol  50 mg twice daily. Rep notes these results were already faxed to the office, unclear why alert not called yesterday. Will continue with current treatment plan, route to ordering provider.

## 2024-01-01 NOTE — Telephone Encounter (Signed)
Will continue to monitor.

## 2024-01-02 ENCOUNTER — Ambulatory Visit: Payer: BC Managed Care – PPO | Attending: Physician Assistant | Admitting: Emergency Medicine

## 2024-01-02 ENCOUNTER — Encounter: Payer: Self-pay | Admitting: Physician Assistant

## 2024-01-02 VITALS — BP 128/86 | HR 67 | Ht 67.0 in | Wt 167.6 lb

## 2024-01-02 DIAGNOSIS — E785 Hyperlipidemia, unspecified: Secondary | ICD-10-CM | POA: Diagnosis not present

## 2024-01-02 DIAGNOSIS — I480 Paroxysmal atrial fibrillation: Secondary | ICD-10-CM | POA: Diagnosis not present

## 2024-01-02 DIAGNOSIS — I10 Essential (primary) hypertension: Secondary | ICD-10-CM

## 2024-01-02 DIAGNOSIS — I2510 Atherosclerotic heart disease of native coronary artery without angina pectoris: Secondary | ICD-10-CM

## 2024-01-02 NOTE — Progress Notes (Addendum)
Cardiology Office Note:    Date:  01/02/2024  ID:  Mark [name], DOB [birth year], MRN [id number] PCP: Patient, No Pcp Per  [facility] Providers Cardiologist:  Mark Swaziland, MD Electrophysiologist:  Mark [name], MD       Patient Profile:      Mark [name] is a 63 year old male with visit pertinent history of atrial fibrillation s/p ablation in 2017, CAD, HLD, HTN, SVT s/p ablation 2008, tobacco abuse.  He had a STEMI in May 2012 and was treated with BMS to third OM.  Follow-up Myoview in June 2012 was satisfactory.  In April 2015 Myoview was intermediate risk.  He underwent cardiac cath which showed severe stenosis in the proximal LAD.  He had successful DES to LAD at that time.  He was intolerant of Brilinta due to dyspnea and switched to Effient.    In December 2015 he was admitted with new atrial fibrillation with RVR and converted spontaneously.  Echocardiogram at this time was normal and he was placed on Eliquis and diltiazem.  He later developed increased symptoms of palpitations with recurrent A-fib and in February 2017 he underwent A-fib ablation with Dr. Elberta [name] with excellent results.  He was seen by Dr. Swaziland on 06/16/2023 and was doing well at the time and no medication changes were made.  He was last seen in clinic on 12/23/2023 by Mark Fife, PA and at that time he had noted complaints of heart fluttering and palpitations that are reminiscent of his A-fib.  He was told to take 25 mg of Lopressor every 6 hours for palpitations.  Cardiac event monitor was ordered at that time and is currently pending results.      History of Present Illness:  Discussed the use of AI scribe software for clinical note transcription with the patient, who gave verbal consent to proceed.  Mark [name] is a 63 y.o. male who returns for 6 month f/u for CAD.   Today the patient notes that he is doing well overall.  He notes episodes of a fluttering feeling in his chest. These episodes are not  daily but have occurred twice since his last appointment ten days ago with EP. The episodes are brief and do not linger. He is currently wearing a heart monitor.   The patient is active, working in a grocery store's dairy department, and he frequently does yard work and work around [employer].  He quit smoking in 2012. He has been increasing his hydration and plans to focus on dieting this year.   He denies chest pain, shortness of breath, lower extremity edema, fatigue, palpitations, melena, hematuria, hemoptysis, diaphoresis, weakness, presyncope, syncope, orthopnea, and PND.    Review of Systems  Constitutional: Negative for weight gain and weight loss.  Cardiovascular:  Positive for palpitations. Negative for chest pain, claudication, dyspnea on exertion, irregular heartbeat, leg swelling, near-syncope, orthopnea, paroxysmal nocturnal dyspnea and syncope.  Respiratory:  Negative for cough, hemoptysis and shortness of breath.   Gastrointestinal:  Negative for abdominal pain, hematochezia and melena.  Genitourinary:  Negative for hematuria.  Neurological:  Negative for dizziness and light-headedness.     See HPI     Home Medications:    Prior to Admission medications   Medication Sig Start Date End Date Taking? Authorizing Provider  apixaban (ELIQUIS) 5 MG TABS tablet TAKE 1 TABLET BY MOUTH TWICE A DAY 07/23/23   [name], Mark M, MD  atorvastatin (LIPITOR) 40 MG tablet TAKE 1 TABLET BY MOUTH EVERY DAY 07/17/23  [name], Mark M, MD  clonazePAM (KLONOPIN) 0.5 MG tablet Patient taken 4 tablets(0.25 mg total) by mouth in past two weeks    [provider]  metoprolol tartrate (LOPRESSOR) 50 MG tablet Take 1 tablet (50 mg total) by mouth 2 (two) times daily. May take extra 25 mg as needed for palpitations 12/23/23   Mark Pigeon, PA-C  nitroGLYCERIN (NITROSTAT) 0.4 MG SL tablet PLACE 1 TABLET UNDER TONGUE EVERY 5 MINS AS NEEDED FOR CHEST PAIN, ON 3RD DOSE CALL 911 07/26/22   [name], Mark  M, MD  Omega-3 Fatty Acids (FISH OIL) 1200 MG CAPS Take 2 capsules (2,400 mg total) by mouth in the morning and at bedtime. 06/16/23   [name], Mark M, MD  omeprazole (PRILOSEC) 40 MG capsule Take 40 mg by mouth daily.  12/26/15   [provider]   Studies Reviewed:   EKG Interpretation Date/Time:  Friday January 02 2024 13:20:00 EST Ventricular Rate:  67 PR Interval:  128 QRS Duration:  100 QT Interval:  404 QTC Calculation: 426 R Axis:   24  Text Interpretation: Normal sinus rhythm Incomplete right bundle branch block Nonspecific T wave abnormality Confirmed by Rise Paganini [phone]) on 01/02/2024 2:04:35 PM    CT 01/17/2016 1.  The pulmonary veins appear normal, measurements above. 2. Extensive coronary artery disease. Proximal LAD and proximal PLOM stents appeared patent, but the mid to distal LAD appears diffusely diseased, there appears to be up to moderate proximal RCA stenosis, and the distal PLOM appears diseased. Would consider functional testing.  Echocardiogram 12/29/2014 - Left ventricle: The cavity size was normal. Systolic function was    normal. The estimated ejection fraction was in the range of 55%    to 60%. Wall motion was normal; there were no regional wall    motion abnormalities. Left ventricular diastolic function    parameters were normal.  Risk Assessment/Calculations:    CHA2DS2-VASc Score = 2   This indicates a 2.2% annual risk of stroke. The patient's score is based upon: CHF History: 0 HTN History: 1 Diabetes History: 0 Stroke History: 0 Vascular Disease History: 1 Age Score: 0 Gender Score: 0            Physical Exam:   VS:  BP 128/86   Pulse 67   Ht 5\' 7"  (1.702 m)   Wt 167 lb 9.6 oz (76 kg)   SpO2 93%   BMI 26.25 kg/m    Wt Readings from Last 3 Encounters:  01/02/24 167 lb 9.6 oz (76 kg)  12/23/23 169 lb 12.8 oz (77 kg)  06/16/23 167 lb 6.4 oz (75.9 kg)    Constitutional:      Appearance: Normal and healthy appearance.  Not in distress.  Neck:     Vascular: JVD normal.  Pulmonary:     Effort: Pulmonary effort is normal.     Breath sounds: Normal breath sounds.  Chest:     Chest wall: Not tender to palpatation.  Cardiovascular:     PMI at left midclavicular line. Normal rate. Regular rhythm. Normal S1. Normal S2.      Murmurs: There is no murmur.     No gallop.  No click. No rub.  Pulses:    Intact distal pulses.  Edema:    Peripheral edema absent.  Musculoskeletal: Normal range of motion.     Cervical back: Normal range of motion and neck supple. Skin:    General: Skin is warm and dry.  Neurological:  General: No focal deficit present.     Mental Status: Alert, oriented to person, place, and time and oriented to person, place and time.  Psychiatric:        Mood and Affect: Mood and affect normal.        Behavior: Behavior is cooperative.        Thought Content: Thought content normal.        Assessment and Plan:  Coronary artery disease Remote BMS to OM3 in 2012. S/p DES of LAD 04/13/14  He is stable with no anginal symptoms, no indication for ischemic evaluation  -Not on ASA given OAC therapy -Continue Metoprolol Tartrate mg BID and Nitroglycerin 0.4mg  prn  -Encouraged heart healthy dieting. Increase fiber fruits, and vegetables. Decreased processed foods and food high in sugar.    Paroxysmal atrial fibrillation  S/p successful ablation 01/2016 EKG today in [facility] w/o ST-T wave changes Notes intermittent "fluttering" in chest. Recently seen by EP, has 14-day ZIO monitor on currently. Sees Dr. Elberta [name] on 03/02/24. -Managed by EP -Continue Eliquis 5mg  BID. No bleeding concerns noted.   CHA2DS2-VASc Score = 2 [CHF History: 0, HTN History: 1, Diabetes History: 0, Stroke History: 0, Vascular Disease History: 1, Age Score: 0, Gender Score: 0].  Therefore, the patient's annual risk of stroke is 2.2 %.      Hyperlipidemia LDL 73 on 12/2023 and under good control, he right above goal of  <70 -Continue Atorvastain 40mg  daily -I recommend the mediterranean diet and 150 minutes of moderate intensity exercise daily   Tobacco use Quit many years ago   Hypertension  BP 128/86 and under excellent control -Continue Metoprolol Tartrate 50mg  BID           Dispo:  Return in about 6 months (around 07/01/2024).  Signed, Denyce Robert, NP

## 2024-01-02 NOTE — Patient Instructions (Signed)
Medication Instructions:  NO CHANGES *If you need a refill on your cardiac medications before your next appointment, please call your pharmacy*   Lab Work: NO LABS If you have labs (blood work) drawn today and your tests are completely normal, you will receive your results only by: MyChart Message (if you have MyChart) OR A paper copy in the mail If you have any lab test that is abnormal or we need to change your treatment, we will call you to review the results.   Testing/Procedures: NO TESTING   Follow-Up: At [facility], you and your health needs are our priority.  As part of our continuing mission to provide you with exceptional heart care, we have created designated Provider Care Teams.  These Care Teams include your primary Cardiologist (physician) and Advanced Practice Providers (APPs -  Physician Assistants and Nurse Practitioners) who all work together to provide you with the care you need, when you need it.   Your next appointment:   6 month(s)  Provider:   Peter Swaziland, MD   Other Instructions

## 2024-01-23 ENCOUNTER — Other Ambulatory Visit: Payer: Self-pay | Admitting: Cardiology

## 2024-01-23 DIAGNOSIS — I480 Paroxysmal atrial fibrillation: Secondary | ICD-10-CM

## 2024-01-23 NOTE — Telephone Encounter (Signed)
 Prescription refill request for Eliquis  received. Indication:afib Last office visit:1/25 Scr:0.88  1/25 Age: 63 Weight:76  kg  Prescription refilled

## 2024-01-26 ENCOUNTER — Ambulatory Visit: Payer: BC Managed Care – PPO | Attending: Physician Assistant

## 2024-01-26 DIAGNOSIS — R42 Dizziness and giddiness: Secondary | ICD-10-CM

## 2024-01-28 ENCOUNTER — Other Ambulatory Visit: Payer: Self-pay | Admitting: Physician Assistant

## 2024-01-28 DIAGNOSIS — I2510 Atherosclerotic heart disease of native coronary artery without angina pectoris: Secondary | ICD-10-CM

## 2024-02-02 ENCOUNTER — Other Ambulatory Visit: Payer: Self-pay | Admitting: *Deleted

## 2024-02-02 DIAGNOSIS — I2510 Atherosclerotic heart disease of native coronary artery without angina pectoris: Secondary | ICD-10-CM

## 2024-02-02 MED ORDER — METOPROLOL TARTRATE 100 MG PO TABS: 100.0000 mg | ORAL_TABLET | Freq: Two times a day (BID) | ORAL | Status: DC

## 2024-02-03 DIAGNOSIS — Z7901 Long term (current) use of anticoagulants: Secondary | ICD-10-CM | POA: Diagnosis not present

## 2024-02-03 DIAGNOSIS — K219 Gastro-esophageal reflux disease without esophagitis: Secondary | ICD-10-CM | POA: Diagnosis not present

## 2024-03-02 ENCOUNTER — Ambulatory Visit: Payer: Federal, State, Local not specified - PPO | Attending: Cardiology | Admitting: Cardiology

## 2024-03-02 ENCOUNTER — Encounter: Payer: Self-pay | Admitting: Cardiology

## 2024-03-02 VITALS — BP 130/94 | HR 64 | Ht 67.0 in | Wt 168.0 lb

## 2024-03-02 DIAGNOSIS — D6869 Other thrombophilia: Secondary | ICD-10-CM | POA: Diagnosis not present

## 2024-03-02 DIAGNOSIS — I10 Essential (primary) hypertension: Secondary | ICD-10-CM

## 2024-03-02 DIAGNOSIS — I480 Paroxysmal atrial fibrillation: Secondary | ICD-10-CM | POA: Diagnosis not present

## 2024-03-02 NOTE — Progress Notes (Addendum)
  Electrophysiology Office Note:   Date:  03/02/2024  ID:  Mark [name], DOB [birth year], MRN [id number]  Primary Cardiologist: Mark Swaziland, MD Primary Heart Failure: None Electrophysiologist: Mark [name] Mark Loa, MD      History of Present Illness:   Mark [name] is a 63 y.o. male with h/o coronary artery disease post multiple PCI's, hypertension, hyperlipidemia, atrial fibrillation seen today for routine electrophysiology followup.   Since last being seen in our clinic the patient reports doing overall well.  He has no chest pain or shortness of breath.  He was previously having significant palpitations.  He wore a cardiac monitor that showed no atrial fibrillation.  Since wearing the monitor, he has done well.  His palpitations have not bothered him.  he denies chest pain, palpitations, dyspnea, PND, orthopnea, nausea, vomiting, dizziness, syncope, edema, weight gain, or early satiety.   Review of systems complete and found to be negative unless listed in HPI.   EP Information / Studies Reviewed:    EKG is not ordered today. EKG from 01/02/2024 reviewed which showed rhythm, incomplete right bundle branch block        Risk Assessment/Calculations:    CHA2DS2-VASc Score = 2   This indicates a 2.2% annual risk of stroke. The patient's score is based upon: CHF History: 0 HTN History: 1 Diabetes History: 0 Stroke History: 0 Vascular Disease History: 1 Age Score: 0 Gender Score: 0            Physical Exam:   VS:  There were no vitals taken for this visit.   Wt Readings from Last 3 Encounters:  01/02/24 167 lb 9.6 oz (76 kg)  12/23/23 169 lb 12.8 oz (77 kg)  06/16/23 167 lb 6.4 oz (75.9 kg)     GEN: Well nourished, well developed in no acute distress NECK: No JVD; No carotid bruits CARDIAC: Regular rate and rhythm, no murmurs, rubs, gallops RESPIRATORY:  Clear to auscultation without rales, wheezing or rhonchi  ABDOMEN: Soft, non-tender, non-distended EXTREMITIES:  No  edema; No deformity   ASSESSMENT AND PLAN:    1.  Paroxysmal atrial fibrillation: Post ablation in 2017.  He was having palpitations and wore a cardiac monitor that showed no evidence of atrial fibrillation.  No further palpitations.  Happy with his control.  2.  Secondary hypercoagulable state: Currently on Eliquis 5 mg twice daily for atrial fibrillation  3.  Hypertension: No current angina.  Plan per primary cardiology  4.  Hypertension: Currently well-controlled  5.  Preoperative evaluation: Patient has upcoming colonoscopy.  He would be at low to intermediate risk for this procedure.  No further cardiac testing is necessary.  He would be able to stop his Eliquis per protocol.  No acute need for electrophysiology at this time.  He has not required any further care.  He can follow-up with his primary cardiologist.  He can see me back in clinic if further arrhythmias arise.   Follow up with Dr. Elberta [name]  as needed   Signed, Mark [name] Mark Loa, MD

## 2024-03-22 ENCOUNTER — Ambulatory Visit: Admitting: Orthopedic Surgery

## 2024-03-22 ENCOUNTER — Encounter: Payer: Self-pay | Admitting: Orthopedic Surgery

## 2024-03-22 ENCOUNTER — Other Ambulatory Visit (INDEPENDENT_AMBULATORY_CARE_PROVIDER_SITE_OTHER): Payer: Self-pay

## 2024-03-22 DIAGNOSIS — M25511 Pain in right shoulder: Secondary | ICD-10-CM | POA: Diagnosis not present

## 2024-03-22 DIAGNOSIS — M79672 Pain in left foot: Secondary | ICD-10-CM

## 2024-03-22 DIAGNOSIS — G8929 Other chronic pain: Secondary | ICD-10-CM

## 2024-03-22 NOTE — Progress Notes (Signed)
 Office Visit Note   Patient: Mark [name]           Date of Birth: 07/21/1961           MRN: [id number] Visit Date: 03/22/2024              Requested by: No referring provider defined for this encounter. PCP: Patient, No Pcp Per  Chief Complaint  Patient presents with   Left Foot - Pain   Right Shoulder - Pain      HPI: Patient is a 63 year old gentleman who presents with aching pain in his right shoulder that is sporadic.  He also complains of sporadic left heel pain at the insertion of the Achilles.  Patient has tried ice and orthotics and this has improved his heel symptoms.  Assessment & Plan: Visit Diagnoses:  1. Chronic right shoulder pain   2. Pain of left heel     Plan: Recommended internal and external rotation strengthening for the right shoulder and Achilles stretching.  These were both demonstrated.  Follow-Up Instructions: Return if symptoms worsen or fail to improve.   Ortho Exam  Patient is alert, oriented, no adenopathy, well-dressed, normal affect, normal respiratory effort. Examination patient has good range of motion of the right shoulder.  He has no pain with Neer and Hawkins impingement test the biceps tendon is not tender to palpation.  The [facility] joint is not tender to palpation.  Examination of the left foot he has a good dorsalis pedis pulse.  He has dorsiflexion to neutral with his knee extended.  The Achilles is not tender to palpation.  No nodules.  Imaging: XR Shoulder Right Result Date: 03/22/2024 2 view radiographs of the right shoulder shows good alignment with good subacromial joint space.  No images are attached to the encounter.  Labs: Lab Results  Component Value Date   HGBA1C 6.1 (H) 04/16/2021   HGBA1C (H) 04/23/2011    6.0 (NOTE)                                                                       According to the ADA Clinical Practice Recommendations for 2011, when HbA1c is used as a screening test:   >=6.5%   Diagnostic of  Diabetes Mellitus           (if abnormal result  is confirmed)  5.7-6.4%   Increased risk of developing Diabetes Mellitus  References:Diagnosis and Classification of Diabetes Mellitus,Diabetes Care,2011,34(Suppl 1):S62-S69 and Standards of Medical Care in         Diabetes - 2011,Diabetes Care,2011,34  (Suppl 1):S11-S61.     Lab Results  Component Value Date   ALBUMIN 4.5 12/23/2023   ALBUMIN 4.5 06/11/2023   ALBUMIN 4.3 05/23/2022    Lab Results  Component Value Date   MG 2.1 12/11/2014   MG 2.1 04/24/2011   No results found for: "VD25OH"  No results found for: "PREALBUMIN"    Latest Ref Rng & Units 06/11/2023   10:05 AM 04/16/2021    3:55 PM 04/13/2020    8:16 AM  CBC EXTENDED  WBC 3.4 - 10.8 x10E3/uL 5.6  8.0  8.2   RBC 4.14 - 5.80 x10E6/uL 5.20  5.23  5.20   Hemoglobin 13.0 -  17.7 g/dL 16.1  09.6  04.5   HCT 37.5 - 51.0 % 46.4  46.5  45.9   Platelets 150 - 450 x10E3/uL 227  239  259   NEUT# 1.4 - 7.0 x10E3/uL 3.3  5.0  5.6   Lymph# 0.7 - 3.1 x10E3/uL 1.6  2.3  1.7      There is no height or weight on file to calculate BMI.  Orders:  Orders Placed This Encounter  Procedures   XR Shoulder Right   No orders of the defined types were placed in this encounter.    Procedures: No procedures performed  Clinical Data: No additional findings.  ROS:  All other systems negative, except as noted in the HPI. Review of Systems  Objective: Vital Signs: There were no vitals taken for this visit.  Specialty Comments:  No specialty comments available.  PMFS History: Patient Active Problem List   Diagnosis Date Noted   SVT (supraventricular tachycardia) (HCC)    Hypertension    AF (atrial fibrillation) (HCC) 01/25/2016   Atrial fibrillation (HCC)    Atrial fibrillation with rapid ventricular response (HCC) 12/11/2014   HTN (hypertension) 06/24/2014   Angina pectoris (HCC) 04/13/2014   CAD (coronary artery disease) 05/10/2011   Hyperlipidemia 05/10/2011   Tobacco  abuse 05/10/2011   STEMI (ST elevation myocardial infarction) (HCC) 04/23/2011   S/P coronary artery stent placement 04/23/2011   Past Medical History:  Diagnosis Date   Atrial fibrillation (HCC)    CAD (coronary artery disease)    Hyperlipidemia    Hypertension    S/P coronary artery stent placement 04/23/11   BMS to the 3rd OM; has residual disease   STEMI (ST elevation myocardial infarction) (HCC) 04/23/11   SVT (supraventricular tachycardia) (HCC)    Tobacco abuse     Family History  Problem Relation Age of Onset   Hypertension Mother     Past Surgical History:  Procedure Laterality Date   CORONARY STENT PLACEMENT  04/23/11   BMS to the 3rd OM   ELECTROPHYSIOLOGIC STUDY N/A 01/25/2016   Procedure: Atrial Fibrillation Ablation;  Surgeon: Will [name], MD;  Location: [facility] INVASIVE CV LAB;  Service: Cardiovascular;  Laterality: N/A;   LEFT HEART CATHETERIZATION WITH CORONARY ANGIOGRAM N/A 04/13/2014   Procedure: LEFT HEART CATHETERIZATION WITH CORONARY ANGIOGRAM;  Surgeon: Peter M Swaziland, MD;  Location: [facility] CATH LAB;  Service: Cardiovascular;  Laterality: N/A;   PERCUTANEOUS CORONARY STENT INTERVENTION (PCI-S) Right 04/13/2014   Procedure: PERCUTANEOUS CORONARY STENT INTERVENTION (PCI-S);  Surgeon: Peter M Swaziland, MD;  Location: [facility] CATH LAB;  Service: Cardiovascular;  Laterality: Right;   SVT ABLATION   2008   Dr. Ladona Ridgel   Social History   Occupational History   Occupation: food Corporate treasurer: [employer]  Tobacco Use   Smoking status: Former    Current packs/day: 0.00    Types: Cigarettes    Quit date: 04/23/2011    Years since quitting: 12.9   Smokeless tobacco: Former    Quit date: 04/23/2011  Substance and Sexual Activity   Alcohol use: No   Drug use: No   Sexual activity: Yes

## 2024-05-24 ENCOUNTER — Emergency Department (HOSPITAL_COMMUNITY)

## 2024-05-24 ENCOUNTER — Other Ambulatory Visit: Payer: Self-pay

## 2024-05-24 ENCOUNTER — Encounter (HOSPITAL_COMMUNITY): Payer: Self-pay

## 2024-05-24 ENCOUNTER — Emergency Department (HOSPITAL_COMMUNITY)
Admission: EM | Admit: 2024-05-24 | Discharge: 2024-05-25 | Disposition: A | Discharge status: Home / Self Care | Attending: Emergency Medicine | Admitting: Emergency Medicine

## 2024-05-24 DIAGNOSIS — R002 Palpitations: Secondary | ICD-10-CM | POA: Diagnosis not present

## 2024-05-24 DIAGNOSIS — I10 Essential (primary) hypertension: Secondary | ICD-10-CM | POA: Diagnosis not present

## 2024-05-24 DIAGNOSIS — Z87891 Personal history of nicotine dependence: Secondary | ICD-10-CM | POA: Diagnosis not present

## 2024-05-24 DIAGNOSIS — Z7901 Long term (current) use of anticoagulants: Secondary | ICD-10-CM | POA: Diagnosis not present

## 2024-05-24 DIAGNOSIS — I2510 Atherosclerotic heart disease of native coronary artery without angina pectoris: Secondary | ICD-10-CM | POA: Diagnosis not present

## 2024-05-24 DIAGNOSIS — R079 Chest pain, unspecified: Secondary | ICD-10-CM | POA: Diagnosis not present

## 2024-05-24 DIAGNOSIS — J9811 Atelectasis: Secondary | ICD-10-CM | POA: Diagnosis not present

## 2024-05-24 DIAGNOSIS — I4891 Unspecified atrial fibrillation: Secondary | ICD-10-CM | POA: Insufficient documentation

## 2024-05-24 LAB — BASIC METABOLIC PANEL WITH GFR
Anion gap: 9 (ref 5–15)
BUN: 14 mg/dL (ref 8–23)
CO2: 26 mmol/L (ref 22–32)
Calcium: 9.4 mg/dL (ref 8.9–10.3)
Chloride: 108 mmol/L (ref 98–111)
Creatinine, Ser: 0.92 mg/dL (ref 0.61–1.24)
GFR, Estimated: >60 mL/min (ref >60)
Glucose, Bld: 126 mg/dL — ABNORMAL HIGH (ref 70–99)
Potassium: 4.1 mmol/L (ref 3.5–5.1)
Sodium: 143 mmol/L (ref 135–145)

## 2024-05-24 LAB — CBC
HCT: 45.6 % (ref 39.0–52.0)
Hemoglobin: 15.4 g/dL (ref 13.0–17.0)
MCH: 30.6 pg (ref 26.0–34.0)
MCHC: 33.8 g/dL (ref 30.0–36.0)
MCV: 90.5 fL (ref 80.0–100.0)
Platelets: 228 10*3/uL (ref 150–400)
RBC: 5.04 MIL/uL (ref 4.22–5.81)
RDW: 13.2 % (ref 11.5–15.5)
WBC: 8.3 10*3/uL (ref 4.0–10.5)
nRBC: 0 % (ref 0.0–0.2)

## 2024-05-24 LAB — TROPONIN I (HIGH SENSITIVITY): Troponin I (High Sensitivity): 9 ng/L (ref <18)

## 2024-05-24 LAB — MAGNESIUM: Magnesium: 1.8 mg/dL (ref 1.7–2.4)

## 2024-05-24 MED ORDER — SODIUM CHLORIDE 0.9 % IV BOLUS
1000.0000 mL | Freq: Once | INTRAVENOUS | Status: AC
  Administered 2024-05-24: 1000 mL via INTRAVENOUS

## 2024-05-24 NOTE — ED Provider Triage Note (Cosign Needed Addendum)
 Emergency Medicine Provider Triage Evaluation Note  Mark [name] , a 63 y.o. male  was evaluated in triage.  Pt complains of heart feels out of rhythm. Ablation for a fib in 02/2024, feels similar. Onset this evening. Took extra metoprolol  prior to coming in tonight.  Review of Systems  Positive: Palpitations  Negative: SHOB, CP  Physical Exam  BP 117/65 (BP Location: Left Arm)   Pulse 90   Temp 97.9 F (36.6 C) (Oral)   Resp 18   Ht 5\' 7"  (1.702 m)   Wt 76.2 kg   SpO2 90%   BMI 26.31 kg/m  Gen:   Awake, no distress   Resp:  Normal effort  MSK:   Moves extremities without difficulty  Other:  HR variable from 90s-200s  Medical Decision Making  Medically screening exam initiated at 9:12 PM.  Appropriate orders placed.  Tayveon Lombardo was informed that the remainder of the evaluation will be completed by another provider, this initial triage assessment does not replace that evaluation, and the importance of remaining in the ED until their evaluation is complete.     Darlis Eisenmenger, PA-C 05/24/24 2114    Darlis Eisenmenger, PA-C 05/24/24 2115

## 2024-05-24 NOTE — ED Provider Notes (Signed)
 [place] [facility] Provider Note   CSN: [id number] Arrival date & time: 05/24/24  2008     History {Add pertinent medical, surgical, social history, OB history to HPI:1} Chief Complaint  Patient presents with   Palpitations    Mark [name] is a 63 y.o. male.  Patient with past medical history significant for CAD, hypertension, A-fib, SVT, history of STEMI, currently anticoagulated on Eliquis  presents to the emergency department complaining of 3 hours of palpitations.  He reports palpitations began at approximately 530 this evening.  He felt that his heart was racing.  He does report multiple previous ablations due to atrial fibrillation.  He denies chest pain, shortness of breath, abdominal pain, nausea, vomiting.   Palpitations      Home Medications Prior to Admission medications   Medication Sig Start Date End Date Taking? Authorizing Provider  atorvastatin  (LIPITOR) 40 MG tablet TAKE 1 TABLET BY MOUTH EVERY DAY 07/17/23   [place], Peter M, MD  ELIQUIS  5 MG TABS tablet TAKE 1 TABLET BY MOUTH TWICE A DAY 01/23/24   [place], Peter M, MD  metoprolol  tartrate (LOPRESSOR ) 100 MG tablet Take 1 tablet (100 mg total) by mouth 2 (two) times daily. 02/02/24   Ursuy, Renee Lynn, PA-C  nitroGLYCERIN  (NITROSTAT ) 0.4 MG SL tablet PLACE 1 TABLET UNDER TONGUE EVERY 5 MINS AS NEEDED FOR CHEST PAIN, ON 3RD DOSE CALL 911 07/26/22   [place], Peter M, MD  Omega-3 Fatty Acids (FISH OIL ) 1200 MG CAPS Take 2 capsules (2,400 mg total) by mouth in the morning and at bedtime. 06/16/23   [place], Peter M, MD  omeprazole (PRILOSEC) 40 MG capsule Take 40 mg by mouth daily.  12/26/15   [provider]      Allergies    Penicillins    Review of Systems   Review of Systems  Cardiovascular:  Positive for palpitations.    Physical Exam Updated Vital Signs BP 117/65 (BP Location: Left Arm)   Pulse 90   Temp 97.9 F (36.6 C) (Oral)   Resp 18   Ht 5\' 7"  (1.702 m)   Wt  76.2 kg   SpO2 90%   BMI 26.31 kg/m  Physical Exam Vitals and nursing note reviewed.  Constitutional:      General: He is not in acute distress.    Appearance: He is well-developed.  HENT:     Head: Normocephalic and atraumatic.  Eyes:     Conjunctiva/sclera: Conjunctivae normal.  Cardiovascular:     Rate and Rhythm: Normal rate and regular rhythm.  Pulmonary:     Effort: Pulmonary effort is normal. No respiratory distress.     Breath sounds: Normal breath sounds.  Abdominal:     Palpations: Abdomen is soft.     Tenderness: There is no abdominal tenderness.  Musculoskeletal:        General: No swelling.     Cervical back: Neck supple.     Right lower leg: No edema.     Left lower leg: No edema.  Skin:    General: Skin is warm and dry.     Capillary Refill: Capillary refill takes less than 2 seconds.  Neurological:     Mental Status: He is alert.  Psychiatric:        Mood and Affect: Mood normal.     ED Results / Procedures / Treatments   Labs (all labs ordered are listed, but only abnormal results are displayed) Labs Reviewed  BASIC METABOLIC PANEL  WITH GFR - Abnormal; Notable for the following components:      Result Value   Glucose, Bld 126 (*)    All other components within normal limits  CBC  MAGNESIUM  TROPONIN I (HIGH SENSITIVITY)    EKG EKG Interpretation Date/Time:  Monday May 24 2024 21:13:40 EDT Ventricular Rate:  140 PR Interval:    QRS Duration:  82 QT Interval:  206 QTC Calculation: 314 R Axis:   35  Text Interpretation: Atrial fibrillation with rapid ventricular response Nonspecific ST and T wave abnormality Abnormal ECG When compared with ECG of 24-May-2024 21:13, PREVIOUS ECG IS PRESENT afib is new Confirmed by Deatra Face [id number]) on 05/24/2024 9:59:35 PM  Radiology DG Chest 2 View Result Date: 05/24/2024 CLINICAL DATA:  Chest pain, heart palpitations EXAM: CHEST - 2 VIEW COMPARISON:  12/10/2014 FINDINGS: Heart and mediastinal contours  within normal limits. Bibasilar linear opacities most compatible with atelectasis. No effusions or edema. No acute bony abnormality. IMPRESSION: Bibasilar atelectasis. Electronically Signed   By: Janeece Mechanic M.D.   On: 05/24/2024 21:07    Procedures Procedures  {Document cardiac monitor, telemetry assessment procedure when appropriate:1}  Medications Ordered in ED Medications - No data to display  ED Course/ Medical Decision Making/ A&P   {   Click here for ABCD2, HEART and other calculatorsREFRESH Note before signing :1}                              Medical Decision Making  This patient presents to the ED for concern of palpitations, this involves an extensive number of treatment options, and is a complaint that carries with it a high risk of complications and morbidity.  The differential diagnosis includes SVT, A-fib RVR, other dysrhythmia, others   Co morbidities / Chronic conditions that complicate the patient evaluation  History of SVT, atrial fibrillation   Additional history obtained:  Additional history obtained from EMR External records from outside source obtained and reviewed including cardiology notes   Lab Tests:  I Ordered, and personally interpreted labs.  The pertinent results include: Unremarkable BMP, CBC, magnesium, troponin   Imaging Studies ordered:  I ordered imaging studies including chest x-ray I independently visualized and interpreted imaging which showed bibasilar atelectasis I agree with the radiologist interpretation   Cardiac Monitoring: / EKG:  The patient was maintained on a cardiac monitor.  I personally viewed and interpreted the cardiac monitored which showed an underlying rhythm of: Sinus rhythm   Problem List / ED Course / Critical interventions / Medication management   I ordered medication including saline bolus Reevaluation of the patient after these medicines showed that the patient improved I have reviewed the patients  home medicines and have made adjustments as needed   Consultations Obtained:  I requested consultation with the ***,  and discussed lab and imaging findings as well as pertinent plan - they recommend: ***   Social Determinants of Health:  ***   Test / Admission - Considered:  ***   {Document critical care time when appropriate:1} {Document review of labs and clinical decision tools ie heart score, Chads2Vasc2 etc:1}  {Document your independent review of radiology images, and any outside records:1} {Document your discussion with family members, caretakers, and with consultants:1} {Document social determinants of health affecting pt's care:1} {Document your decision making why or why not admission, treatments were needed:1} Final Clinical Impression(s) / ED Diagnoses Final diagnoses:  None    Rx /  DC Orders ED Discharge Orders          Ordered    Amb referral to AFIB Clinic        05/24/24 2114

## 2024-05-24 NOTE — ED Triage Notes (Signed)
 Pt reports 3 hours of "feeling like my heart is racing." H/x afib. Has had multiple ablations in the past. No CP or SHOB.

## 2024-05-25 ENCOUNTER — Encounter: Payer: Self-pay | Admitting: Cardiology

## 2024-05-25 NOTE — Discharge Instructions (Addendum)
 Your workup today showed that you were initially in atrial fibrillation.  This resolved while you were here in the emergency department.  Please continue to take your prescribed medications as directed and follow-up with your cardiologist.  If you develop chest pain or other life-threatening symptoms please return to the emergency department.

## 2024-05-26 NOTE — Progress Notes (Signed)
  Electrophysiology Office Note:   Date:  05/28/2024  ID:  Mark [name], DOB May 14, 1961, MRN [id number]  Primary Cardiologist: Peter Swaziland, MD Electrophysiologist: Lei Pump, MD      History of Present Illness:   Mark [name] is a 63 y.o. male with h/o CAD, AF s/p ablation, HTN, HLD, and AF s/p ablation seen today for acute visit due to AF with RVR.    Patient reports overall had been doing well until earlier this week when he had breakthrough, persistent palpitations. EKG in the ED appears to show brief bursts of SVT vs AF with RVR. Monitor in February showed similar (SVT). He retires this week, last day yesterday so there has been a lot of stress around that. He was also working out in the heat the day of his episodes, which resolved with IV fluids. Otherwise, he denies chest pain, dyspnea, PND, orthopnea, nausea, vomiting, dizziness, syncope, edema, weight gain, or early satiety.   Review of systems complete and found to be negative unless listed in HPI.   EP Information / Studies Reviewed:    EKG is not ordered today. EKG from 05/24/2024 reviewed which showed sinus tachycardia with brief burst of SVT vs AF RVR  A second EKG shows a brief period of AF RVR.   Arrhythmia/Device History S/p AF ablation 01/25/2016   Physical Exam:   VS:  BP 118/76   Pulse 60   Ht 5' 7 (1.702 m)   Wt 169 lb (76.7 kg)   SpO2 95%   BMI 26.47 kg/m    Wt Readings from Last 3 Encounters:  05/28/24 169 lb (76.7 kg)  05/24/24 167 lb 15.9 oz (76.2 kg)  03/02/24 168 lb (76.2 kg)     GEN: No acute distress NECK: No JVD; No carotid bruits CARDIAC: Regular rate and rhythm, no murmurs, rubs, gallops RESPIRATORY:  Clear to auscultation without rales, wheezing or rhonchi  ABDOMEN: Soft, non-tender, non-distended EXTREMITIES:  No edema; No deformity   ASSESSMENT AND PLAN:    Paroxysmal AF SVT EKG in ED showed brief burst of AF vs SVT Change lopressor  to 25 mg as needed.  Start TOPROL  100 mg  BID Continue eliquis  5 mg BID for CHA2DS2/VASc of at least 2. Medication options include Tikosyn and Amiodarone given h/o CAD Discussed option of re-do ablation as well, would like to try additional toprol  first. Will review EKG with Dr. Lawana Pray.   Secondary hypercoagulable state Pt on Eliquis  as above    HTN Stable on current regimen   CAD No s/s of ischemia.      Follow up with EP Team in 3 months  Signed, Tylene Galla, PA-C

## 2024-05-27 ENCOUNTER — Encounter (HOSPITAL_COMMUNITY): Payer: Self-pay

## 2024-05-28 ENCOUNTER — Ambulatory Visit: Attending: Student | Admitting: Student

## 2024-05-28 ENCOUNTER — Encounter: Payer: Self-pay | Admitting: Student

## 2024-05-28 VITALS — BP 118/76 | HR 60 | Ht 67.0 in | Wt 169.0 lb

## 2024-05-28 DIAGNOSIS — I10 Essential (primary) hypertension: Secondary | ICD-10-CM

## 2024-05-28 DIAGNOSIS — D6869 Other thrombophilia: Secondary | ICD-10-CM

## 2024-05-28 DIAGNOSIS — I471 Supraventricular tachycardia, unspecified: Secondary | ICD-10-CM

## 2024-05-28 DIAGNOSIS — I2510 Atherosclerotic heart disease of native coronary artery without angina pectoris: Secondary | ICD-10-CM | POA: Diagnosis not present

## 2024-05-28 DIAGNOSIS — I480 Paroxysmal atrial fibrillation: Secondary | ICD-10-CM | POA: Diagnosis not present

## 2024-05-28 MED ORDER — METOPROLOL SUCCINATE ER 100 MG PO TB24: 100.0000 mg | ORAL_TABLET | Freq: Two times a day (BID) | ORAL | 6 refills | Status: DC

## 2024-05-28 MED ORDER — METOPROLOL TARTRATE 25 MG PO TABS: 25.0000 mg | ORAL_TABLET | ORAL | 3 refills | Status: DC | PRN

## 2024-05-28 NOTE — Patient Instructions (Signed)
 Medication Instructions:  Change Lopressor  to 25 mg as needed for breakthrough.  START toprol  100 mg BID *If you need a refill on your cardiac medications before your next appointment, please call your pharmacy*  Lab Work: No labwork ordered today. If you have labs (blood work) drawn today and your tests are completely normal, you will receive your results only by: MyChart Message (if you have MyChart) OR A paper copy in the mail If you have any lab test that is abnormal or we need to change your treatment, we will call you to review the results.  Testing/Procedures: No testing ordered today  Follow-Up: At [facility], you and your health needs are our priority.  As part of our continuing mission to provide you with exceptional heart care, our providers are all part of one team.  This team includes your primary Cardiologist (physician) and Advanced Practice Providers or APPs (Physician Assistants and Nurse Practitioners) who all work together to provide you with the care you need, when you need it.  Your next appointment:   3 month(s)  Provider:   You may see Will Cortland Ding, MD or one of the following Advanced Practice Providers on your designated Care Team:   Mertha Abrahams, PA-C Mattix Imhof Andy Anderia Lorenzo, PA-C Suzann Riddle, NP Creighton Doffing, NP    We recommend signing up for the patient portal called MyChart.  Sign up information is provided on this After Visit Summary.  MyChart is used to connect with patients for Virtual Visits (Telemedicine).  Patients are able to view lab/test results, encounter notes, upcoming appointments, etc.  Non-urgent messages can be sent to your provider as well.   To learn more about what you can do with MyChart, go to [email or web address].

## 2024-07-07 NOTE — Progress Notes (Unsigned)
 Mark [name] Date of Birth: 07/13/1961 Medical Record #[id number]  History of Present Illness: Mark [name] is seen for follow up Atrial fibrillation and CAD. He has HLD, CAD, HTN, SVT with remote ablation in 2008 per Dr. Waddell and prior history of tobacco abuse. Had STEMI in May of 2012 and was treated with BMS to the 3rd OM.  Follow up Myoview  in June of 2012 was satisfactory. In April 2015 Myoview  was intermediate risk. He underwent cardiac cath which showed severe stenosis in the proximal LAD. He had successful DES of the LAD. He was intolerant of Brilinta  due to dyspnea and was switched to Effient .    In December 2015 he was admitted with new atrial fibrillation with RVR. Labs were normal. He converted spontaneously. Echo was normal. He was placed on Eliquis .  Plavix  and ASA were stopped. He was started on diltiazem . He later developed increased symptoms of palpitations with recurrent Afib. In February 2017 he underwent AFib ablation with Dr. Inocencio with excellent results.   He was seen in ED in June with Afib vs SVT. Seen by EP in follow up and started on Toprol  XL.   On follow up today he reports he is doing very well. No palpitations.   Denies any chest pain, SOB, or dizziness.  He is not smoking. He states he is very active.   Current Outpatient Medications  Medication Sig Dispense Refill   atorvastatin  (LIPITOR) 40 MG tablet TAKE 1 TABLET BY MOUTH EVERY DAY 90 tablet 3   ELIQUIS  5 MG TABS tablet TAKE 1 TABLET BY MOUTH TWICE A DAY 180 tablet 1   metoprolol  succinate (TOPROL -XL) 100 MG 24 hr tablet Take 1 tablet (100 mg total) by mouth 2 (two) times daily. Take with or immediately following a meal. 60 tablet 6   metoprolol  tartrate (LOPRESSOR ) 25 MG tablet Take 1 tablet (25 mg total) by mouth as needed (May take up to two tablets for breakthrough palpitations.). 45 tablet 3   nitroGLYCERIN  (NITROSTAT ) 0.4 MG SL tablet PLACE 1 TABLET UNDER TONGUE EVERY 5 MINS AS NEEDED FOR CHEST PAIN, ON 3RD  DOSE CALL 911 25 tablet 11   Omega-3 Fatty Acids (FISH OIL ) 1200 MG CAPS Take 2 capsules (2,400 mg total) by mouth in the morning and at bedtime.     omeprazole (PRILOSEC) 40 MG capsule Take 40 mg by mouth daily.   6   No current facility-administered medications for this visit.    Allergies  Allergen Reactions   Penicillins     Passed out and got weak  Has patient had a PCN reaction causing immediate rash, facial/tongue/throat swelling, SOB or lightheadedness with hypotension: Yes Has patient had a PCN reaction causing severe rash involving mucus membranes or skin necrosis: No Has patient had a PCN reaction that required hospitalization No Has patient had a PCN reaction occurring within the last 10 years: No If all of the above answers are NO, then may proceed with Cephalosporin use.    Past Medical History:  Diagnosis Date   Atrial fibrillation (HCC)    CAD (coronary artery disease)    Hyperlipidemia    Hypertension    S/P coronary artery stent placement 04/23/11   BMS to the 3rd OM; has residual disease   STEMI (ST elevation myocardial infarction) (HCC) 04/23/11   SVT (supraventricular tachycardia) (HCC)    Tobacco abuse     Past Surgical History:  Procedure Laterality Date   CORONARY STENT PLACEMENT  04/23/11   BMS to the  3rd OM   ELECTROPHYSIOLOGIC STUDY N/A 01/25/2016   Procedure: Atrial Fibrillation Ablation;  Surgeon: Will Gladis Norton, MD;  Location: [facility] INVASIVE CV LAB;  Service: Cardiovascular;  Laterality: N/A;   LEFT HEART CATHETERIZATION WITH CORONARY ANGIOGRAM N/A 04/13/2014   Procedure: LEFT HEART CATHETERIZATION WITH CORONARY ANGIOGRAM;  Surgeon: Evert Wenrich M Swaziland, MD;  Location: [facility] CATH LAB;  Service: Cardiovascular;  Laterality: N/A;   PERCUTANEOUS CORONARY STENT INTERVENTION (PCI-S) Right 04/13/2014   Procedure: PERCUTANEOUS CORONARY STENT INTERVENTION (PCI-S);  Surgeon: Judah Carchi M [place]aziland, MD;  Location: [facility] CATH LAB;  Service: Cardiovascular;  Laterality: Right;   SVT  ABLATION   2008   Dr. Waddell    Social History   Tobacco Use  Smoking Status Former   Current packs/day: 0.00   Types: Cigarettes   Quit date: 04/23/2011   Years since quitting: 13.2  Smokeless Tobacco Former   Quit date: 04/23/2011    Social History   Substance and Sexual Activity  Alcohol Use No    Family History  Problem Relation Age of Onset   Hypertension Mother     Review of Systems: The review of systems is per the HPI.  All other systems were reviewed and are negative.  Physical Exam: There were no vitals taken for this visit. GENERAL:  Well appearing WM in NAD HEENT:  PERRL, EOMI, sclera are clear. Oropharynx is clear. NECK:  No jugular venous distention, carotid upstroke brisk and symmetric, no bruits, no thyromegaly or adenopathy LUNGS:  Clear to auscultation bilaterally CHEST:  Unremarkable HEART:  RRR,  PMI not displaced or sustained,S1 and S2 within normal limits, no S3, no S4: no clicks, no rubs, no murmurs ABD:  Soft, nontender. BS +, no masses or bruits. No hepatomegaly, no splenomegaly EXT:  2 + pulses throughout, no edema, no cyanosis no clubbing SKIN:  Warm and dry.  No rashes NEURO:  Alert and oriented x 3. Cranial nerves II through XII intact. PSYCH:  Cognitively intact   LABORATORY DATA: Lab Results  Component Value Date   WBC 8.3 05/24/2024   HGB 15.4 05/24/2024   HCT 45.6 05/24/2024   PLT 228 05/24/2024   GLUCOSE 126 (H) 05/24/2024   CHOL 135 12/23/2023   TRIG 163 (H) 12/23/2023   HDL 34 (L) 12/23/2023   LDLCALC 73 12/23/2023   ALT 51 (H) 12/23/2023   AST 24 12/23/2023   NA 143 05/24/2024   K 4.1 05/24/2024   CL 108 05/24/2024   CREATININE 0.92 05/24/2024   BUN 14 05/24/2024   CO2 26 05/24/2024   TSH 1.458 12/11/2014   INR 1.04 12/10/2014   HGBA1C 6.1 (H) 04/16/2021   Ecg not done today     Assessment / Plan: 1. CAD - remote BMS of the OM in 2012. S/p DES of LAD 04/13/14. He remains asymptomatic. Continue metoprolol . Not on  antiplatelet therapy due to being on Eliquis .  2. Atrial fibrillation. Paroxysmal. S/p successful ablation in February 2017. Maintaining NSR. Continue eliquis  and metoprolol .   3. HLD - LDL had gone up to 83. Last LDL at goal 53. Will continue current lipitor dose for now. Work on lifestyle modification. Repeat lab in 6 months and if not at goal would add Zetia .   4. Tobacco abuse - he has quit years ago  5. HTN-  Well controlled on medication.   Follow up 6  with labs

## 2024-07-09 ENCOUNTER — Encounter: Payer: Self-pay | Admitting: Cardiology

## 2024-07-09 ENCOUNTER — Ambulatory Visit: Attending: Cardiology | Admitting: Cardiology

## 2024-07-09 VITALS — BP 110/60 | HR 64 | Ht 67.0 in | Wt 169.8 lb

## 2024-07-09 DIAGNOSIS — I2510 Atherosclerotic heart disease of native coronary artery without angina pectoris: Secondary | ICD-10-CM

## 2024-07-09 DIAGNOSIS — I10 Essential (primary) hypertension: Secondary | ICD-10-CM | POA: Diagnosis not present

## 2024-07-09 DIAGNOSIS — I471 Supraventricular tachycardia, unspecified: Secondary | ICD-10-CM | POA: Diagnosis not present

## 2024-07-09 DIAGNOSIS — I480 Paroxysmal atrial fibrillation: Secondary | ICD-10-CM | POA: Diagnosis not present

## 2024-07-09 MED ORDER — NITROGLYCERIN 0.4 MG SL SUBL: SUBLINGUAL_TABLET | SUBLINGUAL | 11 refills | Status: AC

## 2024-07-09 NOTE — Patient Instructions (Signed)

## 2024-07-13 ENCOUNTER — Other Ambulatory Visit: Payer: Self-pay | Admitting: Cardiology

## 2024-07-27 DIAGNOSIS — L820 Inflamed seborrheic keratosis: Secondary | ICD-10-CM | POA: Diagnosis not present

## 2024-07-27 DIAGNOSIS — D1801 Hemangioma of skin and subcutaneous tissue: Secondary | ICD-10-CM | POA: Diagnosis not present

## 2024-07-27 DIAGNOSIS — L538 Other specified erythematous conditions: Secondary | ICD-10-CM | POA: Diagnosis not present

## 2024-07-27 DIAGNOSIS — L578 Other skin changes due to chronic exposure to nonionizing radiation: Secondary | ICD-10-CM | POA: Diagnosis not present

## 2024-08-05 ENCOUNTER — Telehealth: Payer: Self-pay | Admitting: *Deleted

## 2024-08-05 NOTE — Telephone Encounter (Signed)
   Pre-operative Risk Assessment    Patient Name: Mark [name]  DOB: February 05, 1961 MRN: [id number]   Date of last office visit: 07/09/24 DR. SWAZILAND Date of next office visit: 08/26/24 DAPHNE BARRACK, NP   Request for Surgical Clearance    Procedure:  COLONOSCOPY  Date of Surgery:  Clearance TBD                                Surgeon:  DR. BURNETTE Surgeon's Group or Practice Name:  [facility] Phone number:  [phone] Fax number:  [phone]   Type of Clearance Requested:   - Medical  - Pharmacy:  Hold Apixaban  (Eliquis )     Type of Anesthesia:  PROPOFOL    Additional requests/questions:    [name]   08/05/2024, 12:05 PM

## 2024-08-09 NOTE — Telephone Encounter (Signed)
 Patient with diagnosis of paroxysmal atrial fibrillation on apixaban  for anticoagulation.    Procedure: colonoscopy Date of procedure: TBD   CHA2DS2-VASc Score = 2   This indicates a 2.2% annual risk of stroke. The patient's score is based upon: CHF History: 0 HTN History: 1 Diabetes History: 0 Stroke History: 0 Vascular Disease History: 1 Age Score: 0 Gender Score: 0      CrCl 89 mL/min Platelet count 228 (05/24/24)  Patient has not had an Afib/aflutter ablation within the last 3 months or DCCV within the last 30 days.  Per office protocol, patient can hold apixaban  for 2 days prior to procedure.    **This guidance is not considered finalized until pre-operative APP has relayed final recommendations.**

## 2024-08-10 NOTE — Telephone Encounter (Signed)
   Primary Cardiologist: Peter Swaziland, MD  Chart reviewed as part of pre-operative protocol coverage. Given past medical history and time since last visit, based on ACC/AHA guidelines, Mark [name] would be at acceptable risk for the planned procedure without further cardiovascular testing.   Patient should contact our office if he is having new symptoms that are concerning from a cardiac perspective to arrange a follow-up appointment.     Per office protocol, patient can hold apixaban  for 2 days prior to procedure and should resume as soon as stable post procedure per surgeon's discretion.   I will route this recommendation to the requesting party via Epic fax function and remove from pre-op pool.  Please call with questions.  [name], NP-C  08/10/2024, 8:07 AM [address] [place], [place] [address] Office [phone] Fax [phone]

## 2024-08-23 NOTE — Progress Notes (Unsigned)
  Electrophysiology Office Note:   Date:  08/23/2024  ID:  Deloy Archey, DOB 11-30-1961, MRN [id number]  Primary Cardiologist: Peter Swaziland, MD Primary Heart Failure: None Electrophysiologist: Will Gladis Norton, MD  {Click to update primary MD,subspecialty MD or APP then REFRESH:1}    History of Present Illness:   Mark [name] is a 63 y.o. male with h/o AF, SVT, HTN, HLD, CAD s/p STEMI with stent, tobacco abuse  seen today for routine electrophysiology followup.   Seen 05/2024 in EP Clinic acutely for [facility] vs SVT.  He was under a lot of stress at the time due to impending retirement.   Since last being seen in our clinic the patient reports doing ***.    He denies chest pain, palpitations, dyspnea, PND, orthopnea, nausea, vomiting, dizziness, syncope, edema, weight gain, or early satiety.   Review of systems complete and found to be negative unless listed in HPI.   EP Information / Studies Reviewed:    EKG is ordered today. Personal review as below.       Arrhythmia / AAD / Pertinent EP Studies AF  EPS 01/25/16 > SR on presentation, PVI ablation with RF current Cardiac Monitor 01/2024 > no AF, few very brief episodes of fast rates  Risk Assessment/Calculations:    CHA2DS2-VASc Score = 2  {Confirm score is correct.  If not, click here to update score.  REFRESH note.  :1} This indicates a 2.2% annual risk of stroke. The patient's score is based upon: CHF History: 0 HTN History: 1 Diabetes History: 0 Stroke History: 0 Vascular Disease History: 1 Age Score: 0 Gender Score: 0   {This patient has a significant risk of stroke if diagnosed with atrial fibrillation.  Please consider VKA or DOAC agent for anticoagulation if the bleeding risk is acceptable.   You can also use the SmartPhrase .[email or web address] for documentation.   :[id number]} No BP recorded.  {Refresh Note OR Click here to enter BP  :1}***        Physical Exam:   VS:  There were no vitals taken for this visit.   Wt  Readings from Last 3 Encounters:  07/09/24 169 lb 12.8 oz (77 kg)  05/28/24 169 lb (76.7 kg)  05/24/24 167 lb 15.9 oz (76.2 kg)     GEN: Well nourished, well developed in no acute distress NECK: No JVD; No carotid bruits CARDIAC: {EPRHYTHM:[id number]}, no murmurs, rubs, gallops RESPIRATORY:  Clear to auscultation without rales, wheezing or rhonchi  ABDOMEN: Soft, non-tender, non-distended EXTREMITIES:  No edema; No deformity   ASSESSMENT AND PLAN:    Paroxysmal Atrial Fibrillation  SVT CHA2DS2-VASc 2 -OAC for stroke prophylaxis  -continue Toprol  100 mg BID  -PRN metoprolol  25mg  for heart racing -AAD options > tikosyn, amiodarone given CAD hx. Favor tikosyn with patients age -consider re-do ablation with Dr. Norton  Secondary Hypercoagulable State  -continue Eliquis  5mg  BID, dose reviewed and appropriate by age / wt  Hypertension  -well controlled on current regimen ***  CAD s/p STEMI with Stent  LVEF 55-60% -no anginal symptoms ***  -follows with Dr. Swaziland    Follow up with Dr. Norton {EPFOLLOW LE:[id number]}  Signed, Daphne Barrack, NP-C, AGACNP-BC [facility] - Electrophysiology  08/23/2024, 4:48 PM

## 2024-08-26 ENCOUNTER — Ambulatory Visit: Attending: Pulmonary Disease | Admitting: Pulmonary Disease

## 2024-08-26 ENCOUNTER — Encounter: Payer: Self-pay | Admitting: Pulmonary Disease

## 2024-08-26 VITALS — BP 132/78 | HR 63 | Ht 67.0 in | Wt 165.0 lb

## 2024-08-26 DIAGNOSIS — I10 Essential (primary) hypertension: Secondary | ICD-10-CM | POA: Diagnosis not present

## 2024-08-26 DIAGNOSIS — I471 Supraventricular tachycardia, unspecified: Secondary | ICD-10-CM | POA: Diagnosis not present

## 2024-08-26 DIAGNOSIS — I2510 Atherosclerotic heart disease of native coronary artery without angina pectoris: Secondary | ICD-10-CM

## 2024-08-26 DIAGNOSIS — I480 Paroxysmal atrial fibrillation: Secondary | ICD-10-CM | POA: Diagnosis not present

## 2024-08-26 DIAGNOSIS — D6869 Other thrombophilia: Secondary | ICD-10-CM

## 2024-08-26 NOTE — Patient Instructions (Addendum)
 Thank you for choosing [facility]!     Medication Instructions:  No medication changes were made during today's visit.  *If you need a refill on your cardiac medications before your next appointment, please call your pharmacy*   Lab Work: No labs were ordered during today's visit.  If you have labs (blood work) drawn today and your tests are completely normal, you will receive your results only by: MyChart Message (if you have MyChart) OR A paper copy in the mail If you have any lab test that is abnormal or we need to change your treatment, we will call you to review the results.   Testing/Procedures: No procedures were ordered during today's visit.   Your next appointment:   6 month(s)   Provider:   Dr. Soyla Norton  A letter will be mailed to you as a reminder to call the office for your next follow up appointment.    Follow-Up: At [facility], you and your health needs are our priority.  As part of our continuing mission to provide you with exceptional heart care, we have created designated Provider Care Teams.  These Care Teams include your primary Cardiologist (physician) and Advanced Practice Providers (APPs -  Physician Assistants and Nurse Practitioners) who all work together to provide you with the care you need, when you need it. We recommend signing up for the patient portal called MyChart.  Sign up information is provided on this After Visit Summary.  MyChart is used to connect with patients for Virtual Visits (Telemedicine).  Patients are able to view lab/test results, encounter notes, upcoming appointments, etc.  Non-urgent messages can be sent to your provider as well.   To learn more about what you can do with MyChart, go to [email or web address].

## 2024-08-27 ENCOUNTER — Encounter: Payer: Self-pay | Admitting: Cardiology

## 2024-08-27 ENCOUNTER — Telehealth: Payer: Self-pay

## 2024-08-27 DIAGNOSIS — I480 Paroxysmal atrial fibrillation: Secondary | ICD-10-CM

## 2024-08-27 MED ORDER — APIXABAN 5 MG PO TABS: 5.0000 mg | ORAL_TABLET | Freq: Two times a day (BID) | ORAL | 1 refills | Status: AC

## 2024-08-27 NOTE — Telephone Encounter (Signed)
 Eliquis  reordered per OV note 08/26/24

## 2024-09-01 ENCOUNTER — Other Ambulatory Visit: Payer: Self-pay | Admitting: Student

## 2024-09-01 DIAGNOSIS — I471 Supraventricular tachycardia, unspecified: Secondary | ICD-10-CM

## 2024-10-18 ENCOUNTER — Encounter: Payer: Self-pay | Admitting: Radiology

## 2024-10-29 DIAGNOSIS — Z860101 Personal history of adenomatous and serrated colon polyps: Secondary | ICD-10-CM | POA: Diagnosis not present

## 2024-10-29 DIAGNOSIS — D123 Benign neoplasm of transverse colon: Secondary | ICD-10-CM | POA: Diagnosis not present

## 2024-10-29 DIAGNOSIS — K648 Other hemorrhoids: Secondary | ICD-10-CM | POA: Diagnosis not present

## 2024-10-29 DIAGNOSIS — Z09 Encounter for follow-up examination after completed treatment for conditions other than malignant neoplasm: Secondary | ICD-10-CM | POA: Diagnosis not present

## 2024-12-08 ENCOUNTER — Other Ambulatory Visit: Payer: Self-pay | Admitting: Student

## 2024-12-08 DIAGNOSIS — I471 Supraventricular tachycardia, unspecified: Secondary | ICD-10-CM

## 2024-12-10 DIAGNOSIS — M109 Gout, unspecified: Secondary | ICD-10-CM | POA: Diagnosis not present

## 2024-12-19 ENCOUNTER — Other Ambulatory Visit: Payer: Self-pay | Admitting: Student

## 2024-12-19 DIAGNOSIS — I480 Paroxysmal atrial fibrillation: Secondary | ICD-10-CM

## 2024-12-19 DIAGNOSIS — I471 Supraventricular tachycardia, unspecified: Secondary | ICD-10-CM

## 2025-05-26 ENCOUNTER — Ambulatory Visit: Admitting: Cardiology
